# Patient Record
Sex: Female | Born: 1985 | Race: Black or African American | Hispanic: No | Marital: Single | Smoking: Current every day smoker
Health system: Southern US, Community
[De-identification: ages and names within clinical notes are randomized; demographics above are authoritative.]

## PROBLEM LIST (undated history)

## (undated) DIAGNOSIS — I10 Essential (primary) hypertension: Secondary | ICD-10-CM

## (undated) DIAGNOSIS — K219 Gastro-esophageal reflux disease without esophagitis: Secondary | ICD-10-CM

## (undated) DIAGNOSIS — I5189 Other ill-defined heart diseases: Secondary | ICD-10-CM

## (undated) DIAGNOSIS — I480 Paroxysmal atrial fibrillation: Secondary | ICD-10-CM

## (undated) DIAGNOSIS — F419 Anxiety disorder, unspecified: Secondary | ICD-10-CM

## (undated) DIAGNOSIS — Z9889 Other specified postprocedural states: Secondary | ICD-10-CM

## (undated) HISTORY — DX: Paroxysmal atrial fibrillation: I48.0

## (undated) HISTORY — DX: Other specified postprocedural states: Z98.890

## (undated) HISTORY — DX: Anxiety disorder, unspecified: F41.9

## (undated) HISTORY — PX: FOOT SURGERY: SHX648

## (undated) HISTORY — DX: Other ill-defined heart diseases: I51.89

---

## 2009-11-15 ENCOUNTER — Emergency Department: Payer: Self-pay | Admitting: Emergency Medicine

## 2010-07-14 ENCOUNTER — Emergency Department: Payer: Self-pay | Admitting: Emergency Medicine

## 2010-10-22 ENCOUNTER — Emergency Department: Payer: Self-pay | Admitting: Emergency Medicine

## 2010-11-05 ENCOUNTER — Emergency Department: Payer: Self-pay | Admitting: Emergency Medicine

## 2010-11-28 ENCOUNTER — Emergency Department: Payer: Self-pay | Admitting: Emergency Medicine

## 2011-02-20 ENCOUNTER — Observation Stay: Payer: Self-pay | Admitting: Obstetrics & Gynecology

## 2011-05-11 ENCOUNTER — Inpatient Hospital Stay: Payer: Self-pay

## 2011-05-11 LAB — CBC WITH DIFFERENTIAL/PLATELET
Basophil #: 0 10*3/uL (ref 0.0–0.1)
Eosinophil #: 0.2 10*3/uL (ref 0.0–0.7)
HCT: 35.5 % (ref 35.0–47.0)
Lymphocyte #: 2.4 10*3/uL (ref 1.0–3.6)
Lymphocyte %: 17.9 %
MCHC: 33 g/dL (ref 32.0–36.0)
MCV: 92 fL (ref 80–100)
Monocyte %: 10 %
Neutrophil %: 70.2 %
RBC: 3.85 10*6/uL (ref 3.80–5.20)
RDW: 14.1 % (ref 11.5–14.5)
WBC: 13.5 10*3/uL — ABNORMAL HIGH (ref 3.6–11.0)

## 2013-08-19 ENCOUNTER — Emergency Department: Payer: Self-pay | Admitting: Emergency Medicine

## 2013-11-06 ENCOUNTER — Emergency Department: Payer: Self-pay | Admitting: Student

## 2014-06-28 ENCOUNTER — Emergency Department
Admit: 2014-06-28 | Disposition: A | Discharge status: Home / Self Care | Payer: Self-pay | Admitting: Emergency Medicine

## 2014-07-01 ENCOUNTER — Emergency Department
Admit: 2014-07-01 | Disposition: A | Discharge status: Home / Self Care | Payer: Self-pay | Admitting: Emergency Medicine

## 2014-07-01 NOTE — Op Note (Signed)
PATIENT NAME:  Carol Small, Carol Small MR#:  [id number] DATE OF BIRTH:  [birth year]  DATE OF PROCEDURE:  05/11/2011  POSTOPERATIVE DIAGNOSIS: Term intrauterine pregnancy, fetal intolerance to labor.   POSTOPERATIVE DIAGNOSIS: Term intrauterine pregnancy, fetal intolerance to labor.   PROCEDURE PERFORMED: Low transverse cesarean section.   SURGEON: Glean Salen, M.D.   ASSISTANT: Midwife Putnam  ANESTHESIA: Spinal.   ESTIMATED BLOOD LOSS: 250 mL.   COMPLICATIONS: None.   FINDINGS: Normal tubes, ovaries, and uterus. Viable female infant named [place] weighing 6 pounds, 14 ounces with Apgar scores of 9 and 9 at one and five minutes, respectively.   DISPOSITION: To the recovery room in stable condition.   TECHNIQUE: The patient is prepped and draped in the usual sterile fashion after adequate anesthesia is obtained in the supine position on the operating room table.  A scalpel is used to create a low transverse skin incision that is quickly dissected through the rectus fascia with the rectus muscle separated in the midline. The peritoneum is penetrated and the bladder dissected off the lower uterine segment. Scalpel is then used to create a low transverse hysterotomy incision that is extended by blunt dissection. The amniotomy reveals mostly clear fluid, although there may be meconium stain. There was an amnioinfusion in progress prior to delivery. The infant's head is grasped and easily delivered without the use of a vacuum device. The remaining portion of the infant is delivered with clamping and cutting of the umbilical cord. The baby responded with a vigorous cry before the umbilical cord was even clamped. The infant is handed to the pediatric team.   Cord blood is obtained and the placenta is manually extracted. The uterus is externalized and cleansed of all membranes and debris using a moist sponge. The hysterotomy incision is closed with a running #1 Vicryl suture in a locking fashion followed  by a second layer to imbricate the first layer with excellent hemostasis noted. The uterus is placed back in the intraabdominal cavity and the paracolic gutters are irrigated with warm saline. Reexamination of the incision reveals excellent hemostasis. Interceed is placed over the incision. The peritoneum is closed with a Vicryl suture. The On-Q pain pump set up and is administered. Two trocars are placed into the abdomen into the subfascial space and then the catheters are threaded into this space. The rectus fascia is closed with 0 Maxon suture followed by subcutaneous irrigation and hemostasis is assured using electrocautery, subcuticular closed with a 4-0 Vicryl suture and then Dermabond is then applied. The catheters for the On-Q pain pump are hooked up and flushed with 5 mL each of 0.5% bupivacaine and then they are stabilized in place with Steri-Strips and a Tegaderm bandage. The patient goes to the recovery room in stable condition. All sponge, instrument, and needle counts are correct.  ____________________________ R. Barnett Applebaum, MD rph:slb D: 05/11/2011 15:20:27 ET T: 05/11/2011 16:11:23 ET JOB#: [id number]  cc: Glean Salen, MD, <Dictator> Gae Dry MD ELECTRONICALLY SIGNED 05/14/2011 7:06

## 2014-07-17 NOTE — H&P (Signed)
L&D Evaluation:  History:   HPI 29 year old G2P0010 at 39 weeks 5 days presented to L&D early this AM with SROM. Was 1 cm on arrival and grossly ruptured. Rec'd epidural recently and had prolonged decel shortly after that recovered after position changes, Terbutiline, fluid bolus, and o2 (see Dr Kenton Kingfisher recent progress note).   PNC at [facility] notable for early entry to care, GERD, and freqent headaches (using Fioricet).    Presents with leaking fluid, light meconium    Patient's Medical History No Chronic Illness  SAB 07/2010    Patient's Surgical History other  foot surgery    Medications Pre Natal Vitamins  fioricet, Zantac    Allergies NKDA    Social History tobacco    Family History Non-Contributory   ROS:   General normal    HEENT normal    GU normal    Resp normal   Exam:   Vital Signs stable    General no apparent distress    Mental Status clear    Chest clear    Abdomen gravid, non-tender    Estimated Fetal Weight Average for gestational age    Back no CVAT    Edema no edema    Pelvic no external lesions, 5/100/-1    Mebranes Ruptured    Description green/meconium    FHT just recovered from prolong decel, had some variables and early decels prior to prolong    Ucx regular, tachysystole-resolved with terbutiline    Skin dry   Impression:   Impression active labor, 39w 5d IUP   Plan:   Plan monitor contractions and for cervical change, other, FSE placed, will place IUPC, continue to closely observe FHR    Comments Dr Kenton Kingfisher called to L&D during prolong decel, pt counseled on possible need for c/s if FHR continues to decel. FHR recovered at this time after interventions. Will continue to closely observe.   Electronic Signatures: Shann Medal (CNM)  (Signed 04-Mar-13 09:17)  Authored: L&D Evaluation   Last Updated: 04-Mar-13 09:17 by Shann Medal (CNM)

## 2015-06-03 ENCOUNTER — Encounter: Payer: Self-pay | Admitting: *Deleted

## 2015-06-10 ENCOUNTER — Ambulatory Visit: Payer: Self-pay

## 2015-06-10 ENCOUNTER — Encounter: Payer: Self-pay | Admitting: General Surgery

## 2015-06-10 ENCOUNTER — Ambulatory Visit (INDEPENDENT_AMBULATORY_CARE_PROVIDER_SITE_OTHER): Payer: Medicaid Other | Admitting: General Surgery

## 2015-06-10 VITALS — BP 126/74 | HR 82 | Resp 14 | Ht 64.0 in | Wt 190.0 lb

## 2015-06-10 DIAGNOSIS — N6320 Unspecified lump in the left breast, unspecified quadrant: Secondary | ICD-10-CM

## 2015-06-10 DIAGNOSIS — N63 Unspecified lump in breast: Secondary | ICD-10-CM | POA: Diagnosis not present

## 2015-06-10 NOTE — Progress Notes (Signed)
Patient ID: Carol Small, female   DOB: Nov 16, 1985, 30 y.o.   MRN: [id number]  Chief Complaint  Patient presents with  . OTHER    Left breast mass    HPI Carol Small is a 30 y.o. female.  Here today for evaluation of a left breast mass.  Noticed twelve years ago, no change in size or shape.  Locacated in lower inner quad.  Reports some tenderness from time to time. At one time she did seek evaluation but had no further w/u8 done. I have reviewed the history of present illness with the patient. HPI  Past Medical History  Diagnosis Date  . S/P LEEP of cervix     Past Surgical History  Procedure Laterality Date  . Foot surgery      Family History  Problem Relation Age of Onset  . Thyroid cancer Maternal Grandmother     Social History Social History  Substance Use Topics  . Smoking status: Light Tobacco Smoker  . Smokeless tobacco: Never Used  . Alcohol Use: 0.0 oz/week    0 Standard drinks or equivalent per week    No Known Allergies  No current outpatient prescriptions on file.   No current facility-administered medications for this visit.    Review of Systems Review of Systems  Blood pressure 126/74, pulse 82, resp. rate 14, height 5\' 4"  (1.626 m), weight 190 lb (86.183 kg), last menstrual period 07/14/2010.  Physical Exam Physical Exam  Constitutional: She is oriented to person, place, and time. She appears well-developed and well-nourished.  Eyes: Conjunctivae and EOM are normal. No scleral icterus.  Neck: Neck supple.  Cardiovascular: Normal rate, regular rhythm and normal heart sounds.   Pulmonary/Chest: Effort normal and breath sounds normal. Right breast exhibits no inverted nipple, no mass, no nipple discharge, no skin change and no tenderness. Left breast exhibits mass. Left breast exhibits no inverted nipple, no nipple discharge, no skin change and no tenderness.  1.5 cm hard mass left mammary fold at 7:30 .  Abdominal: Bowel  sounds are normal.  Lymphadenopathy:    She has no cervical adenopathy.    She has no axillary adenopathy.  Neurological: She is alert and oriented to person, place, and time.  Skin: Skin is warm and dry.    Data Reviewed Notes reviewed  Targeted US was performed over left breast mass- a spiculated  Mass with some calcifications seen, some shadowing.  Assessment    Suspicious mass left breast. Discussed fully with pt. Mammogram followed by core biopsy recommended. Pt is agreeable.    Plan    Patient to have a bilateral diagnostic mammogram at [facility] on 06-14-15 at 12 noon. An appointment will be arranged after for left breast core biopsy (office).     Scribed by Garry Heater G 06/12/2015, 3:17 PM

## 2015-06-10 NOTE — Patient Instructions (Addendum)
Patient to return after mammogram.

## 2015-06-12 ENCOUNTER — Encounter: Payer: Self-pay | Admitting: General Surgery

## 2015-06-13 ENCOUNTER — Ambulatory Visit: Payer: Self-pay

## 2015-06-19 ENCOUNTER — Other Ambulatory Visit: Payer: Self-pay

## 2015-06-19 ENCOUNTER — Encounter: Payer: Self-pay | Admitting: General Surgery

## 2015-06-19 ENCOUNTER — Ambulatory Visit (INDEPENDENT_AMBULATORY_CARE_PROVIDER_SITE_OTHER): Payer: Medicaid Other | Admitting: General Surgery

## 2015-06-19 VITALS — Ht 64.0 in | Wt 189.0 lb

## 2015-06-19 DIAGNOSIS — N6320 Unspecified lump in the left breast, unspecified quadrant: Secondary | ICD-10-CM

## 2015-06-19 DIAGNOSIS — N63 Unspecified lump in breast: Secondary | ICD-10-CM

## 2015-06-19 NOTE — Progress Notes (Signed)
Patient ID: Carol Small, female   DOB: 1985/08/25, 30 y.o.   MRN: [id number] The patient is here for a scheduled left breast biopsy and possible left axillary lymph node biopsy.  I have reviewed the history of present illness with the patient.   Mammogram and left axillary [place] were reviewed. The mass in question left breast is suspicious. One axillar node with focal cortical thickening was noted.  Core biopsy of left breast mass and left axillary node were completed today. Further management based on pathology.       This has been scribed by Lesly Rubenstein LPN PCP: Lamonte Sakai

## 2015-06-19 NOTE — Patient Instructions (Signed)
Breast Biopsy, Care After  Refer to this sheet in the next few weeks. These instructions provide you with information on caring for yourself after your procedure. Your caregiver may also give you more specific instructions. Your treatment has been planned according to current medical practices, but problems sometimes occur. Call your caregiver if you have any problems or questions after your procedure.  HOME CARE INSTRUCTIONS    Only take over-the-counter or prescription medicines for pain, discomfort, or fever as directed by your caregiver.   Do not take aspirin. It can cause bleeding.   Keep stitches dry when bathing.   Protect the biopsy area. Do not let the area get bumped.   Avoid activities that may pull the incision site open until approved by your caregiver. This can include stretching, reaching, exercise, sports, or lifting over 3 pounds.   Resume your usual diet.   Wear a good support bra for as long as directed by your caregiver.   Change any bandages (dressings) as directed by your caregiver.   Do not drink alcohol while taking pain medicine.   Keep all your follow-up appointments with your caregiver. Ask when your test results will be ready. Make sure you get your test results.  SEEK MEDICAL CARE IF:    You have redness, swelling, or increasing pain in the biopsy site.   You have a bad smell coming from the biopsy site or dressing.   Your biopsy site breaks open after the stitches (sutures), staples, or skin adhesive strips have been removed.   You have a rash.   You need stronger medicine.  SEEK IMMEDIATE MEDICAL CARE IF:    You have a fever.   You have increased bleeding (more than a small spot) from the biopsy site.   You have difficulty breathing.   You have pus coming from the biopsy site.  MAKE SURE YOU:   Understand these instructions.   Will watch your condition.   Will get help right away if you are not doing well or get worse.     This information is not intended to  replace advice given to you by your health care provider. Make sure you discuss any questions you have with your health care provider.     Document Released: 09/12/2004 Document Revised: 03/16/2014 Document Reviewed: 03/26/2011  Elsevier Interactive Patient Education 2016 Elsevier Inc.

## 2015-06-20 ENCOUNTER — Telehealth: Payer: Self-pay | Admitting: *Deleted

## 2015-06-20 ENCOUNTER — Encounter: Payer: Self-pay | Admitting: General Surgery

## 2015-06-20 NOTE — Telephone Encounter (Signed)
-----   Message from Christene Lye, MD sent at 06/20/2015  3:00 PM EDT ----- Pt was advised over telephone-this is a benign neoplasm -but it does warrant local excision. She was advised on this procedure to be done in SDS with anesthesia. Pt is agreeable and wished to go ahead with schedling.

## 2015-06-20 NOTE — Telephone Encounter (Signed)
Patient's surgery has been scheduled for 07-01-15 at [facility].

## 2015-06-25 ENCOUNTER — Other Ambulatory Visit: Payer: Self-pay | Admitting: General Surgery

## 2015-06-25 DIAGNOSIS — D242 Benign neoplasm of left breast: Secondary | ICD-10-CM

## 2015-06-26 ENCOUNTER — Other Ambulatory Visit: Payer: Medicaid Other

## 2015-06-26 ENCOUNTER — Encounter: Payer: Self-pay | Admitting: *Deleted

## 2015-06-26 NOTE — Patient Instructions (Signed)
  Your procedure is scheduled on: 07-01-15 (MONDAY) Report to [facility] To find out your arrival time please call [phone] between 1PM - 3PM on 06-28-15 (FRIDAY)  Remember: Instructions that are not followed completely may result in serious medical risk, up to and including death, or upon the discretion of your surgeon and anesthesiologist your surgery may need to be rescheduled.    _X___ 1. Do not eat food or drink liquids after midnight. No gum chewing or hard candies.     _X___ 2. No Alcohol for 24 hours before or after surgery.   ____ 3. Bring all medications with you on the day of surgery if instructed.    _X___ 4. Notify your doctor if there is any change in your medical condition     (cold, fever, infections).     Do not wear jewelry, make-up, hairpins, clips or nail polish.  Do not wear lotions, powders, or perfumes. You may wear deodorant.  Do not shave 48 hours prior to surgery. Men may shave face and neck.  Do not bring valuables to the hospital.    [facility] is not responsible for any belongings or valuables.               Contacts, dentures or bridgework may not be worn into surgery.  Leave your suitcase in the car. After surgery it may be brought to your room.  For patients admitted to the hospital, discharge time is determined by your treatment team.   Patients discharged the day of surgery will not be allowed to drive home.   Please read over the following fact sheets that you were given:      ____ Take these medicines the morning of surgery with A SIP OF WATER:    1. NONE  2.   3.   4.  5.  6.  ____ Fleet Enema (as directed)   _X___ Use CHG Soap as directed  ____ Use inhalers on the day of surgery  ____ Stop metformin 2 days prior to surgery    ____ Take 1/2 of usual insulin dose the night before surgery and none on the morning of surgery.   _X___ Stop Coumadin/Plavix/aspirin-STOP BC POWDERS NOW  ____ Stop  Anti-inflammatories-NO NSAIDS OR ASPIRIN PRODUCTS-TYLENOL OK TO TAKE   ____ Stop supplements until after surgery.    ____ Bring C-Pap to the hospital.

## 2015-06-27 ENCOUNTER — Encounter
Admission: RE | Admit: 2015-06-27 | Discharge: 2015-06-27 | Disposition: A | Discharge status: Home / Self Care | Payer: Medicaid Other | Source: Ambulatory Visit | Attending: General Surgery | Admitting: General Surgery

## 2015-06-27 DIAGNOSIS — Z01810 Encounter for preprocedural cardiovascular examination: Secondary | ICD-10-CM | POA: Insufficient documentation

## 2015-07-01 ENCOUNTER — Ambulatory Visit: Payer: Medicaid Other | Admitting: Anesthesiology

## 2015-07-01 ENCOUNTER — Ambulatory Visit
Admission: RE | Admit: 2015-07-01 | Discharge: 2015-07-01 | Disposition: A | Discharge status: Home / Self Care | Payer: Medicaid Other | Source: Ambulatory Visit | Attending: General Surgery | Admitting: General Surgery

## 2015-07-01 ENCOUNTER — Encounter: Payer: Self-pay | Admitting: *Deleted

## 2015-07-01 ENCOUNTER — Encounter
Admission: RE | Disposition: A | Discharge status: Home / Self Care | Payer: Self-pay | Source: Ambulatory Visit | Attending: General Surgery

## 2015-07-01 DIAGNOSIS — Z808 Family history of malignant neoplasm of other organs or systems: Secondary | ICD-10-CM | POA: Insufficient documentation

## 2015-07-01 DIAGNOSIS — D4862 Neoplasm of uncertain behavior of left breast: Secondary | ICD-10-CM | POA: Diagnosis not present

## 2015-07-01 DIAGNOSIS — I10 Essential (primary) hypertension: Secondary | ICD-10-CM | POA: Insufficient documentation

## 2015-07-01 DIAGNOSIS — E669 Obesity, unspecified: Secondary | ICD-10-CM | POA: Diagnosis not present

## 2015-07-01 DIAGNOSIS — F17200 Nicotine dependence, unspecified, uncomplicated: Secondary | ICD-10-CM | POA: Insufficient documentation

## 2015-07-01 DIAGNOSIS — Z6832 Body mass index (BMI) 32.0-32.9, adult: Secondary | ICD-10-CM | POA: Insufficient documentation

## 2015-07-01 DIAGNOSIS — D242 Benign neoplasm of left breast: Secondary | ICD-10-CM | POA: Diagnosis present

## 2015-07-01 HISTORY — DX: Essential (primary) hypertension: I10

## 2015-07-01 HISTORY — PX: BREAST LUMPECTOMY: SHX2

## 2015-07-01 HISTORY — DX: Gastro-esophageal reflux disease without esophagitis: K21.9

## 2015-07-01 LAB — POCT PREGNANCY, URINE: Preg Test, Ur: NEGATIVE

## 2015-07-01 SURGERY — BREAST LUMPECTOMY
Anesthesia: General | Laterality: Left | Wound class: Clean

## 2015-07-01 MED ORDER — BUPIVACAINE HCL (PF) 0.5 % IJ SOLN
INTRAMUSCULAR | Status: AC
  Filled 2015-07-01: qty 30

## 2015-07-01 MED ORDER — FAMOTIDINE 20 MG PO TABS
20.0000 mg | ORAL_TABLET | Freq: Once | ORAL | Status: AC
  Administered 2015-07-01: 20 mg via ORAL

## 2015-07-01 MED ORDER — DEXAMETHASONE SODIUM PHOSPHATE 10 MG/ML IJ SOLN
INTRAMUSCULAR | Status: DC | PRN
  Administered 2015-07-01: 4 mg via INTRAVENOUS

## 2015-07-01 MED ORDER — LACTATED RINGERS IV SOLN
INTRAVENOUS | Status: DC
  Administered 2015-07-01: 07:00:00 via INTRAVENOUS

## 2015-07-01 MED ORDER — LIDOCAINE HCL (CARDIAC) 20 MG/ML IV SOLN
INTRAVENOUS | Status: DC | PRN
  Administered 2015-07-01: 80 mg via INTRAVENOUS

## 2015-07-01 MED ORDER — BUPIVACAINE HCL (PF) 0.5 % IJ SOLN
INTRAMUSCULAR | Status: DC | PRN
  Administered 2015-07-01: 20 mL

## 2015-07-01 MED ORDER — MIDAZOLAM HCL 2 MG/2ML IJ SOLN
INTRAMUSCULAR | Status: DC | PRN
  Administered 2015-07-01: 2 mg via INTRAVENOUS

## 2015-07-01 MED ORDER — PROPOFOL 10 MG/ML IV BOLUS
INTRAVENOUS | Status: DC | PRN
  Administered 2015-07-01: 160 mg via INTRAVENOUS

## 2015-07-01 MED ORDER — ONDANSETRON HCL 4 MG/2ML IJ SOLN
INTRAMUSCULAR | Status: DC | PRN
  Administered 2015-07-01: 4 mg via INTRAVENOUS

## 2015-07-01 MED ORDER — ONDANSETRON HCL 4 MG/2ML IJ SOLN: 4.0000 mg | Freq: Once | INTRAMUSCULAR | Status: DC | PRN

## 2015-07-01 MED ORDER — FENTANYL CITRATE (PF) 100 MCG/2ML IJ SOLN: 25.0000 ug | INTRAMUSCULAR | Status: DC | PRN

## 2015-07-01 MED ORDER — CHLORHEXIDINE GLUCONATE 4 % EX LIQD: 1.0000 "application " | Freq: Once | CUTANEOUS | Status: DC

## 2015-07-01 MED ORDER — TRAMADOL HCL 50 MG PO TABS: 50.0000 mg | ORAL_TABLET | Freq: Four times a day (QID) | ORAL | Status: DC | PRN

## 2015-07-01 MED ORDER — FAMOTIDINE 20 MG PO TABS
ORAL_TABLET | ORAL | Status: AC
  Administered 2015-07-01: 20 mg via ORAL
  Filled 2015-07-01: qty 1

## 2015-07-01 MED ORDER — FENTANYL CITRATE (PF) 100 MCG/2ML IJ SOLN
INTRAMUSCULAR | Status: DC | PRN
  Administered 2015-07-01 (×3): 50 ug via INTRAVENOUS

## 2015-07-01 MED ORDER — KETOROLAC TROMETHAMINE 30 MG/ML IJ SOLN
INTRAMUSCULAR | Status: DC | PRN
  Administered 2015-07-01: 30 mg via INTRAVENOUS

## 2015-07-01 MED ORDER — GLYCOPYRROLATE 0.2 MG/ML IJ SOLN
INTRAMUSCULAR | Status: DC | PRN
  Administered 2015-07-01: .2 mg via INTRAVENOUS

## 2015-07-01 SURGICAL SUPPLY — 30 items
BLADE SURG 15 STRL SS SAFETY (BLADE) ×3 IMPLANT
BULB RESERV EVAC DRAIN JP 100C (MISCELLANEOUS) IMPLANT
CANISTER SUCT 1200ML W/VALVE (MISCELLANEOUS) ×3 IMPLANT
CHLORAPREP W/TINT 26ML (MISCELLANEOUS) ×3 IMPLANT
CNTNR SPEC 2.5X3XGRAD LEK (MISCELLANEOUS) ×1
CONT SPEC 4OZ STER OR WHT (MISCELLANEOUS) ×2
CONTAINER SPEC 2.5X3XGRAD LEK (MISCELLANEOUS) ×1 IMPLANT
COVER PROBE FLX POLY STRL (MISCELLANEOUS) IMPLANT
DEVICE LOCALIZATION ULTRAWIRE (WIRE) IMPLANT
DRAIN CHANNEL JP 15F RND 16 (MISCELLANEOUS) IMPLANT
DRAPE LAPAROTOMY TRNSV 106X77 (MISCELLANEOUS) ×3 IMPLANT
ELECT REM PT RETURN 9FT ADLT (ELECTROSURGICAL) ×3
ELECTRODE REM PT RTRN 9FT ADLT (ELECTROSURGICAL) ×1 IMPLANT
GLOVE BIO SURGEON STRL SZ7 (GLOVE) ×15 IMPLANT
GOWN STRL REUS W/ TWL LRG LVL3 (GOWN DISPOSABLE) ×3 IMPLANT
GOWN STRL REUS W/TWL LRG LVL3 (GOWN DISPOSABLE) ×6
HARMONIC SCALPEL FOCUS (MISCELLANEOUS) IMPLANT
KIT RM TURNOVER STRD PROC AR (KITS) ×3 IMPLANT
LABEL OR SOLS (LABEL) ×3 IMPLANT
LIQUID BAND (GAUZE/BANDAGES/DRESSINGS) ×3 IMPLANT
MARGIN MAP 10MM (MISCELLANEOUS) ×6 IMPLANT
NEEDLE HYPO 25X1 1.5 SAFETY (NEEDLE) ×3 IMPLANT
PACK BASIN MINOR ARMC (MISCELLANEOUS) ×3 IMPLANT
SUT ETH BLK MONO 3 0 FS 1 12/B (SUTURE) ×3 IMPLANT
SUT MNCRL AB 3-0 PS2 27 (SUTURE) ×3 IMPLANT
SUT VIC AB 2-0 BRD 54 (SUTURE) ×3 IMPLANT
SUT VIC AB 2-0 CT2 27 (SUTURE) ×6 IMPLANT
SYR CONTROL 10ML (SYRINGE) ×3 IMPLANT
ULTRAWIRE LOCALIZATION DEVICE (WIRE)
WATER STERILE IRR 1000ML POUR (IV SOLUTION) ×3 IMPLANT

## 2015-07-01 NOTE — H&P (View-Only) (Signed)
Patient ID: Carol Small, female   DOB: 1985/12/20, 30 y.o.   MRN: [id number]  Chief Complaint  Patient presents with  . OTHER    Left breast mass    HPI Carol Small is a 30 y.o. female.  Here today for evaluation of a left breast mass.  Noticed twelve years ago, no change in size or shape.  Locacated in lower inner quad.  Reports some tenderness from time to time. At one time she did seek evaluation but had no further w/u8 done. I have reviewed the history of present illness with the patient. HPI  Past Medical History  Diagnosis Date  . S/P LEEP of cervix     Past Surgical History  Procedure Laterality Date  . Foot surgery      Family History  Problem Relation Age of Onset  . Thyroid cancer Maternal Grandmother     Social History Social History  Substance Use Topics  . Smoking status: Light Tobacco Smoker  . Smokeless tobacco: Never Used  . Alcohol Use: 0.0 oz/week    0 Standard drinks or equivalent per week    No Known Allergies  No current outpatient prescriptions on file.   No current facility-administered medications for this visit.    Review of Systems Review of Systems  Blood pressure 126/74, pulse 82, resp. rate 14, height 5\' 4"  (1.626 m), weight 190 lb (86.183 kg), last menstrual period 07/14/2010.  Physical Exam Physical Exam  Constitutional: She is oriented to person, place, and time. She appears well-developed and well-nourished.  Eyes: Conjunctivae and EOM are normal. No scleral icterus.  Neck: Neck supple.  Cardiovascular: Normal rate, regular rhythm and normal heart sounds.   Pulmonary/Chest: Effort normal and breath sounds normal. Right breast exhibits no inverted nipple, no mass, no nipple discharge, no skin change and no tenderness. Left breast exhibits mass. Left breast exhibits no inverted nipple, no nipple discharge, no skin change and no tenderness.  1.5 cm hard mass left mammary fold at 7:30 .  Abdominal: Bowel  sounds are normal.  Lymphadenopathy:    She has no cervical adenopathy.    She has no axillary adenopathy.  Neurological: She is alert and oriented to person, place, and time.  Skin: Skin is warm and dry.    Data Reviewed Notes reviewed  Targeted US was performed over left breast mass- a spiculated  Mass with some calcifications seen, some shadowing.  Assessment    Suspicious mass left breast. Discussed fully with pt. Mammogram followed by core biopsy recommended. Pt is agreeable.    Plan    Patient to have a bilateral diagnostic mammogram at [facility] on 06-14-15 at 12 noon. An appointment will be arranged after for left breast core biopsy (office).     Scribed by Garry Heater G 06/12/2015, 3:17 PM

## 2015-07-01 NOTE — Anesthesia Preprocedure Evaluation (Addendum)
Anesthesia Evaluation  Patient identified by MRN, date of birth, ID band Patient awake    Reviewed: Allergy & Precautions, NPO status , Patient's Chart, lab work & pertinent test results, reviewed documented beta blocker date and time   Airway Mallampati: II  TM Distance: >3 FB     Dental  (+) Chipped   Pulmonary Current Smoker,           Cardiovascular hypertension,      Neuro/Psych    GI/Hepatic   Endo/Other    Renal/GU      Musculoskeletal   Abdominal   Peds  Hematology   Anesthesia Other Findings Obese. Does not take BP meds. EKG checked and OK.  Reproductive/Obstetrics                            Anesthesia Physical Anesthesia Plan  ASA: II  Anesthesia Plan: General   Post-op Pain Management:    Induction: Intravenous  Airway Management Planned: LMA  Additional Equipment:   Intra-op Plan:   Post-operative Plan:   Informed Consent: I have reviewed the patients History and Physical, chart, labs and discussed the procedure including the risks, benefits and alternatives for the proposed anesthesia with the patient or authorized representative who has indicated his/her understanding and acceptance.     Plan Discussed with: CRNA  Anesthesia Plan Comments:         Anesthesia Quick Evaluation

## 2015-07-01 NOTE — Discharge Instructions (Signed)
AMBULATORY SURGERY  DISCHARGE INSTRUCTIONS   1) The drugs that you were given will stay in your system until tomorrow so for the next 24 hours you should not:  A) Drive an automobile B) Make any legal decisions C) Drink any alcoholic beverage   2) You may resume regular meals tomorrow.  Today it is better to start with liquids and gradually work up to solid foods.  You may eat anything you prefer, but it is better to start with liquids, then soup and crackers, and gradually work up to solid foods.   3) Please notify your doctor immediately if you have any unusual bleeding, trouble breathing, redness and pain at the surgery site, drainage, fever, or pain not relieved by medication.    4) Additional Instructions:        Please contact your physician with any problems or Same Day Surgery at [phone], Monday through Friday 6 am to 4 pm, or [place] at [facility] number at [phone]. Breast Biopsy, Care After  Refer to this sheet in the next few weeks. These instructions provide you with information on caring for yourself after your procedure. Your caregiver may also give you more specific instructions. Your treatment has been planned according to current medical practices, but problems sometimes occur. Call your caregiver if you have any problems or questions after your procedure. HOME CARE INSTRUCTIONS   Only take over-the-counter or prescription medicines for pain, discomfort, or fever as directed by your caregiver.  Do not take aspirin. It can cause bleeding.  Keep stitches dry when bathing.  Protect the biopsy area. Do not let the area get bumped.  Avoid activities that may pull the incision site open until approved by your caregiver. This can include stretching, reaching, exercise, sports, or lifting over 3 pounds.  Resume your usual diet.  Wear a good support bra for as long as directed by your caregiver.  Change any bandages (dressings) as directed by  your caregiver.  Do not drink alcohol while taking pain medicine.  Keep all your follow-up appointments with your caregiver. Ask when your test results will be ready. Make sure you get your test results. SEEK MEDICAL CARE IF:   You have redness, swelling, or increasing pain in the biopsy site.  You have a bad smell coming from the biopsy site or dressing.  Your biopsy site breaks open after the stitches (sutures), staples, or skin adhesive strips have been removed.  You have a rash.  You need stronger medicine. SEEK IMMEDIATE MEDICAL CARE IF:   You have a fever.  You have increased bleeding (more than a small spot) from the biopsy site.  You have difficulty breathing.  You have pus coming from the biopsy site. MAKE SURE YOU:  Understand these instructions.  Will watch your condition.  Will get help right away if you are not doing well or get worse.   This information is not intended to replace advice given to you by your health care provider. Make sure you discuss any questions you have with your health care provider.   Document Released: 09/12/2004 Document Revised: 03/16/2014 Document Reviewed: 03/26/2011 Elsevier Interactive Patient Education [employer].

## 2015-07-01 NOTE — Anesthesia Procedure Notes (Signed)
Procedure Name: LMA Insertion Performed by: Lance Muss Pre-anesthesia Checklist: Patient identified, Emergency Drugs available, Suction available, Patient being monitored and Timeout performed Patient Re-evaluated:Patient Re-evaluated prior to inductionOxygen Delivery Method: Circle system utilized Preoxygenation: Pre-oxygenation with 100% oxygen Intubation Type: IV induction LMA: LMA inserted LMA Size: 3.0 Number of attempts: 1 Tube secured with: Tape Dental Injury: Teeth and Oropharynx as per pre-operative assessment

## 2015-07-01 NOTE — Anesthesia Postprocedure Evaluation (Signed)
Anesthesia Post Note  Patient: Carol Small  Procedure(s) Performed: Procedure(s) (LRB): BREAST LUMPECTOMY (Left)  Patient location during evaluation: PACU Anesthesia Type: General Level of consciousness: awake and alert Pain management: pain level controlled Vital Signs Assessment: post-procedure vital signs reviewed and stable Respiratory status: spontaneous breathing, nonlabored ventilation, respiratory function stable and patient connected to nasal cannula oxygen Cardiovascular status: blood pressure returned to baseline and stable Postop Assessment: no signs of nausea or vomiting Anesthetic complications: no    Last Vitals:  Filed Vitals:   07/01/15 0915 07/01/15 0945  BP: 128/83 133/82  Pulse: 91 88  Temp:    Resp: 18 18    Last Pain: There were no vitals filed for this visit.               Dixie Jafri S

## 2015-07-01 NOTE — Op Note (Signed)
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 LMA  Complications: None  EBL: Minimal  Drains: None  Description: Patient was put to sleep with an LMA and the left breast was prepped and draped as sterile field. Timeout was performed. The mass in question which was previously core biopsied was located at the 7:30 o'clock and centimeters from the nipple. Ultrasound probe was brought to the field and the mass in question was identified and the area marked for an adequate excision. Since the skin was somewhat the pulled down by this mass with decided to take a portion of the skin with the mass. An elliptical type incision and occurred with fashion along the skin crease on was made overlying the mass. The upper lower edges of the skin and subcutaneous tissue were elevated with cautery. The mass in question along with the overlying skin was then excised out with an adequate normal-appearing margin using cautery for control of bleeding. The excised specimen was tagged for margins and sent to pathology. The gland laterally was reapproximated with interrupted interrupted 2-0 Vicryl as also the subcutaneous tissue. Skin was approximated with a subcuticular 3-0 Monocryl stitch. Liqui  ban was applied. Patient subsequently returned recovery room in stable condition

## 2015-07-01 NOTE — Interval H&P Note (Signed)
History and Physical Interval Note:  07/01/2015 7:18 AM  Carol Small  has presented today for surgery, with the diagnosis of BENIGN NEOPLASM LEFT BREAST  The various methods of treatment have been discussed with the patient and family. After consideration of risks, benefits and other options for treatment, the patient has consented to  Procedure(s): BREAST LUMPECTOMY (Left) as a surgical intervention .  The patient's history has been reviewed, patient examined, no change in status, stable for surgery.  I have reviewed the patient's chart and labs.  Questions were answered to the patient's satisfaction.     Veronda [name]

## 2015-07-01 NOTE — Transfer of Care (Signed)
Immediate Anesthesia Transfer of Care Note  Patient: Carol Small  Procedure(s) Performed: Procedure(s): BREAST LUMPECTOMY (Left)  Patient Location: PACU  Anesthesia Type:General  Level of Consciousness: awake, alert  and responds to stimulation  Airway & Oxygen Therapy: Patient Spontanous Breathing and Patient connected to face mask oxygen  Post-op Assessment: Report given to RN and Post -op Vital signs reviewed and stable  Post vital signs: Reviewed and stable  Last Vitals:  Filed Vitals:   07/01/15 0819 07/01/15 0821  BP:  132/90  Pulse:  98  Temp: 35.9 C   Resp: 20 24    Complications: No apparent anesthesia complications

## 2015-07-02 LAB — SURGICAL PATHOLOGY

## 2015-07-03 ENCOUNTER — Telehealth: Payer: Self-pay | Admitting: *Deleted

## 2015-07-03 NOTE — Telephone Encounter (Signed)
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

## 2015-07-03 NOTE — Telephone Encounter (Signed)
Notified patient as instructed, patient pleased. Discussed follow-up appointments, patient agrees  

## 2015-07-04 ENCOUNTER — Encounter: Payer: Self-pay | Admitting: General Surgery

## 2015-07-10 ENCOUNTER — Ambulatory Visit: Payer: Medicaid Other | Admitting: General Surgery

## 2015-08-14 ENCOUNTER — Encounter: Payer: Self-pay | Admitting: *Deleted

## 2015-09-02 ENCOUNTER — Encounter: Payer: Self-pay | Admitting: Emergency Medicine

## 2015-09-02 ENCOUNTER — Emergency Department: Payer: Medicaid Other

## 2015-09-02 ENCOUNTER — Emergency Department
Admission: EM | Admit: 2015-09-02 | Discharge: 2015-09-02 | Disposition: A | Discharge status: Home / Self Care | Payer: Medicaid Other | Attending: Emergency Medicine | Admitting: Emergency Medicine

## 2015-09-02 DIAGNOSIS — J029 Acute pharyngitis, unspecified: Secondary | ICD-10-CM | POA: Diagnosis present

## 2015-09-02 DIAGNOSIS — J0110 Acute frontal sinusitis, unspecified: Secondary | ICD-10-CM

## 2015-09-02 DIAGNOSIS — I10 Essential (primary) hypertension: Secondary | ICD-10-CM | POA: Insufficient documentation

## 2015-09-02 DIAGNOSIS — F17210 Nicotine dependence, cigarettes, uncomplicated: Secondary | ICD-10-CM | POA: Diagnosis not present

## 2015-09-02 DIAGNOSIS — Z7982 Long term (current) use of aspirin: Secondary | ICD-10-CM | POA: Insufficient documentation

## 2015-09-02 LAB — POCT RAPID STREP A: Streptococcus, Group A Screen (Direct): NEGATIVE

## 2015-09-02 MED ORDER — ALBUTEROL SULFATE HFA 108 (90 BASE) MCG/ACT IN AERS: 2.0000 | INHALATION_SPRAY | Freq: Four times a day (QID) | RESPIRATORY_TRACT | Status: DC | PRN

## 2015-09-02 MED ORDER — FLUTICASONE PROPIONATE 50 MCG/ACT NA SUSP: 2.0000 | Freq: Every day | NASAL | Status: DC

## 2015-09-02 MED ORDER — BENZONATATE 100 MG PO CAPS: 100.0000 mg | ORAL_CAPSULE | Freq: Three times a day (TID) | ORAL | Status: DC | PRN

## 2015-09-02 MED ORDER — AMOXICILLIN 500 MG PO CAPS: 500.0000 mg | ORAL_CAPSULE | Freq: Three times a day (TID) | ORAL | Status: DC

## 2015-09-02 NOTE — ED Provider Notes (Signed)
[facility] Emergency Department Provider Note ____________________________________________  Time seen: 1405  I have reviewed the triage vital signs and the nursing notes.  HISTORY  Chief Complaint  Sore Throat  HPI Carol Small is a 30 y.o. female presents to the ED for evaluation of sore throat pain since yesterday. She describes onset at about 3 AM on Saturday morning after sleeping with the fan on overnight.She describes pressure and fullness to the left side of the head and left ear. Since that time she is developing intermittently productive cough, has noticed some pink mucus coming from the sinuses when she coughs. She denies any outright chest pain, shortness of breath, or wheezing. She has dosed over-the-counter multisymptom cough and cold medicine as well as Chloraseptic throat spray, nothing has given her a lasting benefit. She also notes bilateral sore throat. She denies any interim fevers, chills, or sweats. She denies any nasal congestion, runny nose, sneezing, or rhinorrhea. She reports her discomfort at an 8/10 in triage.  Past Medical History  Diagnosis Date  . S/P LEEP of cervix   . GERD (gastroesophageal reflux disease)   . Hypertension     NO MEDS-NEVER FOLLOWED BACK UP WITH PCP    There are no active problems to display for this patient.   Past Surgical History  Procedure Laterality Date  . Foot surgery    . Breast lumpectomy Left 07/01/2015    Procedure: BREAST LUMPECTOMY;  Surgeon: Christene Lye, MD;  Location: [facility] ORS;  Service: General;  Laterality: Left;    Current Outpatient Rx  Name  Route  Sig  Dispense  Refill  . albuterol (PROVENTIL HFA;VENTOLIN HFA) 108 (90 Base) MCG/ACT inhaler   Inhalation   Inhale 2 puffs into the lungs every 6 (six) hours as needed for wheezing or shortness of breath.   1 Inhaler   0   . amoxicillin (AMOXIL) 500 MG capsule   Oral   Take 1 capsule (500 mg total) by mouth 3 (three) times  daily.   30 capsule   0   . Aspirin-Salicylamide-Caffeine (BC HEADACHE PO)   Oral   Take 1 Dose by mouth as needed.         . benzonatate (TESSALON PERLES) 100 MG capsule   Oral   Take 1 capsule (100 mg total) by mouth 3 (three) times daily as needed for cough (Take 1-2 per dose).   30 capsule   0   . calcium carbonate (TUMS - DOSED IN MG ELEMENTAL CALCIUM) 500 MG chewable tablet   Oral   Chew 1 tablet by mouth as needed for indigestion or heartburn.         . etonogestrel (NEXPLANON) 68 MG IMPL implant   Subdermal   1 each by Subdermal route once.         . fluticasone (FLONASE) 50 MCG/ACT nasal spray   Each Nare   Place 2 sprays into both nostrils daily.   16 g   0   . traMADol (ULTRAM) 50 MG tablet   Oral   Take 1 tablet (50 mg total) by mouth every 6 (six) hours as needed.   15 tablet   0    Allergies Review of patient's allergies indicates no known allergies.  Family History  Problem Relation Age of Onset  . Thyroid cancer Maternal Grandmother    Social History Social History  Substance Use Topics  . Smoking status: Current Every Day Smoker -- 0.25 packs/day for 13 years  Types: Cigarettes  . Smokeless tobacco: Never Used  . Alcohol Use: 0.0 oz/week    0 Standard drinks or equivalent per week     Comment: occ   Review of Systems  Constitutional: Negative for fever. Eyes: Negative for visual changes. ENT: Positive for sore throat. Cardiovascular: Negative for chest pain. Respiratory: Negative for shortness of breath. Reports productive cough as above. Gastrointestinal: Negative for abdominal pain, vomiting and diarrhea. Neurological: Negative for headaches, focal weakness or numbness. ____________________________________________  PHYSICAL EXAM:  VITAL SIGNS: ED Triage Vitals  Enc Vitals Group     BP 09/02/15 1353 149/106 mmHg     Pulse Rate 09/02/15 1353 98     Resp 09/02/15 1353 20     Temp 09/02/15 1353 98.7 F (37.1 C)     Temp  Source 09/02/15 1353 Oral     SpO2 09/02/15 1353 96 %     Weight 09/02/15 1353 189 lb (85.73 kg)     Height 09/02/15 1353 5\' 3"  (1.6 m)     Head Cir --      Peak Flow --      Pain Score 09/02/15 1339 8     Pain Loc --      Pain Edu? --      Excl. in [place]? --    Constitutional: Alert and oriented. Well appearing and in no distress. Head: Normocephalic and atraumatic.      Eyes: Conjunctivae are normal. PERRL. Normal extraocular movements      Ears: Canals clear. TMs intact bilaterally.   Nose: No congestion/rhinorrhea.   Mouth/Throat: Mucous membranes are moist.   Neck: Supple. No thyromegaly. Cardiovascular: Normal rate, regular rhythm.  Respiratory: Normal respiratory effort. No wheezes/rales/rhonchi. Gastrointestinal: Soft and nontender. No distention. Musculoskeletal: Nontender with normal range of motion in all extremities.  Neurologic: No gross focal neurologic deficits are appreciated. Skin:  Skin is warm, dry and intact. No rash noted. ____________________________________________   LABS (pertinent positives/negatives) Labs Reviewed  CULTURE, GROUP A STREP [facility])  POCT RAPID STREP A  ____________________________________________  RADIOLOGY  CXR IMPRESSION: Bronchitic changes without infiltrate.  I, Madeliene Tejera, Dannielle Karvonen, personally viewed and evaluated these images (plain radiographs) as part of my medical decision making, as well as reviewing the written report by the radiologist. ____________________________________________  INITIAL IMPRESSION / [place] / ED COURSE  Patient with the an acute frontal sinusitis and x-ray indication of bronchitic changes. She will be discharged with a prescription for amoxicillin, Flonase, Tessalon Perles, and albuterol inhaler. She is encouraged to continue to follow up with her primary care provider and dose previously over-the-counter medications. She should return to the ED for any acute chest pain or shortness  breath. ____________________________________________  FINAL CLINICAL IMPRESSION(S) / ED DIAGNOSES  Final diagnoses:  Acute frontal sinusitis, recurrence not specified     Melvenia Needles, PA-C 09/02/15 1817  Lavonia Drafts, MD 09/03/15 [phone]

## 2015-09-02 NOTE — ED Notes (Addendum)
"  I sleep with a fan on and I woke up about 0300 in the am on Saturday and the whole left side of my head was hurting and stopped up - my ear was hurting and my throat on both sides - I don't really feel any better so I needed to be checked."

## 2015-09-02 NOTE — Discharge Instructions (Signed)
Sinus Rinse WHAT IS A SINUS RINSE? A sinus rinse is a home treatment. It rinses your sinuses with a mixture of salt and water (saline solution). Sinuses are air-filled spaces in your skull behind the bones of your face and forehead. They open into your nasal cavity. To do a sinus rinse, you will need:  Saline solution.  Neti pot or spray bottle. This releases the saline solution into your nose and through your sinuses. You can buy neti pots and spray bottles at:  Your local pharmacy.  A health food store.  Online. WHEN WOULD I DO A SINUS RINSE?  A sinus rinse can help to clear your nasal cavity. It can clear:   Mucus.  Dirt.  Dust.  Pollen. You may do a sinus rinse when you have:  A cold.  A virus.  Allergies.  A sinus infection.  A stuffy nose. If you are considering a sinus rinse:  Ask your child's doctor before doing a sinus rinse on your child.  Do not do a sinus rinse if you have had:  Ear or nasal surgery.  An ear infection.  Blocked ears. HOW DO I DO A SINUS RINSE?   Wash your hands.  Disinfect your device using the directions that came with the device.  Dry your device.  Use the solution that comes with your device or one that is sold separately in stores. Follow the mixing directions on the package.  Fill your device with the amount of saline solution as stated in the device instructions.  Stand over a sink and tilt your head sideways over the sink.  Place the spout of the device in your upper nostril (the one closer to the ceiling).  Gently pour or squeeze the saline solution into the nasal cavity. The liquid should drain to the lower nostril if you are not too congested.  Gently blow your nose. Blowing too hard may cause ear pain.  Repeat in the other nostril.  Clean and rinse your device with clean water.  Air-dry your device. ARE THERE RISKS OF A SINUS RINSE?  Sinus rinse is normally very safe and helpful. However, there are a few  risks, which include:   A burning feeling in the sinuses. This may happen if you do not make the saline solution as instructed. Make sure to follow all directions when making the saline solution.  Infection from unclean water. This is rare, but possible.  Nasal irritation.   This information is not intended to replace advice given to you by your health care provider. Make sure you discuss any questions you have with your health care provider.   Document Released: 09/20/2013 Document Reviewed: 09/20/2013 Elsevier Interactive Patient Education 2016 [employer].  Sinusitis, Adult Sinusitis is redness, soreness, and puffiness (inflammation) of the air pockets in the bones of your face (sinuses). The redness, soreness, and puffiness can cause air and mucus to get trapped in your sinuses. This can allow germs to grow and cause an infection.  HOME CARE   Drink enough fluids to keep your pee (urine) clear or pale yellow.  Use a humidifier in your home.  Run a hot shower to create steam in the bathroom. Sit in the bathroom with the door closed. Breathe in the steam 3-4 times a day.  Put a warm, moist washcloth on your face 3-4 times a day, or as told by your doctor.  Use salt water sprays (saline sprays) to wet the thick fluid in your nose. This can  help the sinuses drain.  Only take medicine as told by your doctor. GET HELP RIGHT AWAY IF:   Your pain gets worse.  You have very bad headaches.  You are sick to your stomach (nauseous).  You throw up (vomit).  You are very sleepy (drowsy) all the time.  Your face is puffy (swollen).  Your vision changes.  You have a stiff neck.  You have trouble breathing. MAKE SURE YOU:   Understand these instructions.  Will watch your condition.  Will get help right away if you are not doing well or get worse.   This information is not intended to replace advice given to you by your health care provider. Make sure you discuss any  questions you have with your health care provider.   Document Released: 08/12/2007 Document Revised: 03/16/2014 Document Reviewed: 09/29/2011 Elsevier Interactive Patient Education 2016 [place] the prescription meds as directed. Follow-up with your provider as needed.

## 2015-09-02 NOTE — ED Notes (Signed)
Sore throat since yesterday   Increased pain with swallowing this am

## 2015-09-05 LAB — CULTURE, GROUP A STREP (THRC)

## 2016-07-25 ENCOUNTER — Emergency Department
Admission: EM | Admit: 2016-07-25 | Discharge: 2016-07-25 | Disposition: A | Discharge status: Home / Self Care | Payer: Medicaid Other | Attending: Emergency Medicine | Admitting: Emergency Medicine

## 2016-07-25 ENCOUNTER — Encounter: Payer: Self-pay | Admitting: Emergency Medicine

## 2016-07-25 DIAGNOSIS — F17210 Nicotine dependence, cigarettes, uncomplicated: Secondary | ICD-10-CM | POA: Diagnosis not present

## 2016-07-25 DIAGNOSIS — J0100 Acute maxillary sinusitis, unspecified: Secondary | ICD-10-CM | POA: Diagnosis not present

## 2016-07-25 DIAGNOSIS — R0981 Nasal congestion: Secondary | ICD-10-CM | POA: Diagnosis present

## 2016-07-25 MED ORDER — TRAMADOL HCL 50 MG PO TABS: 50.0000 mg | ORAL_TABLET | Freq: Two times a day (BID) | ORAL | 0 refills | Status: DC | PRN

## 2016-07-25 MED ORDER — CLARITHROMYCIN 500 MG PO TABS: 500.0000 mg | ORAL_TABLET | Freq: Two times a day (BID) | ORAL | 0 refills | Status: AC

## 2016-07-25 MED ORDER — FEXOFENADINE-PSEUDOEPHED ER 60-120 MG PO TB12: 1.0000 | ORAL_TABLET | Freq: Two times a day (BID) | ORAL | 0 refills | Status: DC

## 2016-07-25 NOTE — ED Notes (Signed)
See triage note  States she developed some nasal congestion couple of days ago  Now having sinus pressure/headache for the past couple of days   Afebrile on arrival

## 2016-07-25 NOTE — ED Triage Notes (Signed)
Pt states was cleaning out her pool last week and went swimming, reports 3 days later her nose "got sore". Pt is noted to be congested at this time.

## 2016-07-25 NOTE — ED Provider Notes (Signed)
[facility] Emergency Department Provider Note   ____________________________________________   First MD Initiated Contact with Patient 07/25/16 1423     (approximate)  I have reviewed the triage vital signs and the nursing notes.   HISTORY  Chief Complaint Facial Pain    HPI Carol Small is a 31 y.o. female patient complain right anterior facial pain and nasal congestion for 1 week. Patient stated pain is worsening the past 3 days. Patient stated onset was started when she cleaned the swimming pool last week and went swimming. Patient states she's been congested ever since. Patient denies any fever or has a frontal headache. Patient denies vision disturbance or vertigo. Patient denies nausea, vomiting, or diarrhea. No palliative measures for her complaint. Patient states she had to leave work secondary to her complaint today.patient rates the pain as a 10 over 10. Patient described the pain as "achy/pressure".   Past Medical History:  Diagnosis Date  . GERD (gastroesophageal reflux disease)   . Hypertension    NO MEDS-NEVER FOLLOWED BACK UP WITH PCP  . S/P LEEP of cervix     There are no active problems to display for this patient.   Past Surgical History:  Procedure Laterality Date  . BREAST LUMPECTOMY Left 07/01/2015   Procedure: BREAST LUMPECTOMY;  Surgeon: Christene Lye, MD;  Location: [facility] ORS;  Service: General;  Laterality: Left;  . FOOT SURGERY      Prior to Admission medications   Medication Sig Start Date End Date Taking? Authorizing Provider  albuterol (PROVENTIL HFA;VENTOLIN HFA) 108 (90 Base) MCG/ACT inhaler Inhale 2 puffs into the lungs every 6 (six) hours as needed for wheezing or shortness of breath. 09/02/15   Menshew, Dannielle Karvonen, PA-C  amoxicillin (AMOXIL) 500 MG capsule Take 1 capsule (500 mg total) by mouth 3 (three) times daily. 09/02/15   Menshew, Dannielle Karvonen, PA-C  Aspirin-Salicylamide-Caffeine (BC  HEADACHE PO) Take 1 Dose by mouth as needed.    [provider]  benzonatate (TESSALON PERLES) 100 MG capsule Take 1 capsule (100 mg total) by mouth 3 (three) times daily as needed for cough (Take 1-2 per dose). 09/02/15   Menshew, Dannielle Karvonen, PA-C  calcium carbonate (TUMS - DOSED IN MG ELEMENTAL CALCIUM) 500 MG chewable tablet Chew 1 tablet by mouth as needed for indigestion or heartburn.    [provider]  clarithromycin (BIAXIN) 500 MG tablet Take 1 tablet (500 mg total) by mouth 2 (two) times daily. 07/25/16 08/08/16  Sable Feil, PA-C  etonogestrel (NEXPLANON) 68 MG IMPL implant 1 each by Subdermal route once.    [provider]  fexofenadine-pseudoephedrine (ALLEGRA-D) 60-120 MG 12 hr tablet Take 1 tablet by mouth 2 (two) times daily. 07/25/16   Sable Feil, PA-C  fluticasone (FLONASE) 50 MCG/ACT nasal spray Place 2 sprays into both nostrils daily. 09/02/15   Menshew, Dannielle Karvonen, PA-C  traMADol (ULTRAM) 50 MG tablet Take 1 tablet (50 mg total) by mouth every 6 (six) hours as needed. 07/01/15   Christene Lye, MD  traMADol (ULTRAM) 50 MG tablet Take 1 tablet (50 mg total) by mouth every 12 (twelve) hours as needed. 07/25/16   Sable Feil, PA-C    Allergies Patient has no known allergies.  Family History  Problem Relation Age of Onset  . Thyroid cancer Maternal Grandmother     Social History Social History  Substance Use Topics  . Smoking status: Current Every Day Smoker  Packs/day: 0.25    Years: 13.00    Types: Cigarettes  . Smokeless tobacco: Never Used  . Alcohol use 0.0 oz/week     Comment: occ    Review of Systems  Constitutional: No fever/chills Eyes: No visual changes. ENT: No sore throat. Nasal and sinus congestion Cardiovascular: Denies chest pain. Respiratory: Denies shortness of breath. Gastrointestinal: No abdominal pain.  No nausea, no vomiting.  No diarrhea.  No constipation. Skin: Negative for  rash. Neurological: positive for headaches, but denies focal weakness or numbness. Endocrine:hypertension  ____________________________________________   PHYSICAL EXAM:  VITAL SIGNS: ED Triage Vitals  Enc Vitals Group     BP 07/25/16 1345 (!) 148/105     Pulse Rate 07/25/16 1345 100     Resp 07/25/16 1345 18     Temp 07/25/16 1345 98.6 F (37 C)     Temp Source 07/25/16 1345 Oral     SpO2 07/25/16 1345 99 %     Weight 07/25/16 1346 179 lb (81.2 kg)     Height 07/25/16 1346 5\' 3"  (1.6 m)     Head Circumference --      Peak Flow --      Pain Score 07/25/16 1345 10     Pain Loc --      Pain Edu? --      Excl. in [place]? --     Constitutional: Alert and oriented. Well appearing and in no acute distress. Head: Atraumatic. Nose:right maxillary guarding with edentulous nasal turbinates. Mouth/Throat: Mucous membranes are moist.  Oropharynx non-erythematous.Postnasal drainage Neck: No stridor.  No cervical spine tenderness to palpation. Hematological/Lymphatic/Immunilogical: No cervical lymphadenopathy. Cardiovascular: Normal rate, regular rhythm. Grossly normal heart sounds.  Good peripheral circulation.elevated blood pressure Respiratory: Normal respiratory effort.  No retractions. Lungs CTAB. Neurologic:  Normal speech and language. No gross focal neurologic deficits are appreciated. No gait instability. Skin:  Skin is warm, dry and intact. No rash noted. Psychiatric: Mood and affect are normal. Speech and behavior are normal.  ____________________________________________   LABS (all labs ordered are listed, but only abnormal results are displayed)  Labs Reviewed - No data to display ____________________________________________  EKG   ____________________________________________  RADIOLOGY   ____________________________________________   PROCEDURES  Procedure(s) performed: None  Procedures  Critical Care performed:  No  ____________________________________________   INITIAL IMPRESSION / ASSESSMENT AND PLAN / ED COURSE  Pertinent labs & imaging results that were available during my care of the patient were reviewed by me and considered in my medical decision making (see chart for details).  Sinusitis with sinus headache. Patient given discharge care instructions. Patient given a work note. Patient advised follow-up family doctor if no improvement in 3 days.      ____________________________________________   FINAL CLINICAL IMPRESSION(S) / ED DIAGNOSES  Final diagnoses:  Acute non-recurrent maxillary sinusitis      NEW MEDICATIONS STARTED DURING THIS VISIT:  New Prescriptions   CLARITHROMYCIN (BIAXIN) 500 MG TABLET    Take 1 tablet (500 mg total) by mouth 2 (two) times daily.   FEXOFENADINE-PSEUDOEPHEDRINE (ALLEGRA-D) 60-120 MG 12 HR TABLET    Take 1 tablet by mouth 2 (two) times daily.   TRAMADOL (ULTRAM) 50 MG TABLET    Take 1 tablet (50 mg total) by mouth every 12 (twelve) hours as needed.     Note:  This document was prepared using Dragon voice recognition software and may include unintentional dictation errors.    Sable Feil, PA-C 07/25/16 1439    Lisa Roca,  MD 07/25/16 1527

## 2016-11-04 ENCOUNTER — Ambulatory Visit: Payer: Medicaid Other | Admitting: Obstetrics and Gynecology

## 2017-05-13 ENCOUNTER — Emergency Department
Admission: EM | Admit: 2017-05-13 | Discharge: 2017-05-13 | Disposition: A | Discharge status: Home / Self Care | Payer: Medicaid Other | Attending: Emergency Medicine | Admitting: Emergency Medicine

## 2017-05-13 ENCOUNTER — Encounter: Payer: Self-pay | Admitting: Emergency Medicine

## 2017-05-13 ENCOUNTER — Other Ambulatory Visit: Payer: Self-pay

## 2017-05-13 DIAGNOSIS — R1030 Lower abdominal pain, unspecified: Secondary | ICD-10-CM | POA: Diagnosis not present

## 2017-05-13 DIAGNOSIS — R0981 Nasal congestion: Secondary | ICD-10-CM | POA: Diagnosis not present

## 2017-05-13 DIAGNOSIS — R0989 Other specified symptoms and signs involving the circulatory and respiratory systems: Secondary | ICD-10-CM | POA: Insufficient documentation

## 2017-05-13 DIAGNOSIS — R112 Nausea with vomiting, unspecified: Secondary | ICD-10-CM | POA: Diagnosis present

## 2017-05-13 DIAGNOSIS — F17210 Nicotine dependence, cigarettes, uncomplicated: Secondary | ICD-10-CM | POA: Diagnosis not present

## 2017-05-13 DIAGNOSIS — I10 Essential (primary) hypertension: Secondary | ICD-10-CM | POA: Insufficient documentation

## 2017-05-13 DIAGNOSIS — M791 Myalgia, unspecified site: Secondary | ICD-10-CM | POA: Insufficient documentation

## 2017-05-13 DIAGNOSIS — R6883 Chills (without fever): Secondary | ICD-10-CM | POA: Insufficient documentation

## 2017-05-13 DIAGNOSIS — B349 Viral infection, unspecified: Secondary | ICD-10-CM | POA: Diagnosis not present

## 2017-05-13 DIAGNOSIS — R05 Cough: Secondary | ICD-10-CM | POA: Diagnosis not present

## 2017-05-13 DIAGNOSIS — R197 Diarrhea, unspecified: Secondary | ICD-10-CM | POA: Insufficient documentation

## 2017-05-13 DIAGNOSIS — Z79899 Other long term (current) drug therapy: Secondary | ICD-10-CM | POA: Diagnosis not present

## 2017-05-13 LAB — URINALYSIS, COMPLETE (UACMP) WITH MICROSCOPIC
Bacteria, UA: NONE SEEN
Bilirubin Urine: NEGATIVE
Glucose, UA: NEGATIVE mg/dL
Ketones, ur: NEGATIVE mg/dL
Leukocytes, UA: NEGATIVE
NITRITE: NEGATIVE
PH: 5 (ref 5.0–8.0)
Protein, ur: NEGATIVE mg/dL
Specific Gravity, Urine: 1.027 (ref 1.005–1.030)

## 2017-05-13 LAB — INFLUENZA PANEL BY PCR (TYPE A & B)
INFLBPCR: NEGATIVE
Influenza A By PCR: NEGATIVE

## 2017-05-13 MED ORDER — ONDANSETRON 8 MG PO TBDP
8.0000 mg | ORAL_TABLET | Freq: Once | ORAL | Status: AC
  Administered 2017-05-13: 8 mg via ORAL
  Filled 2017-05-13: qty 2

## 2017-05-13 MED ORDER — IBUPROFEN 600 MG PO TABS
600.0000 mg | ORAL_TABLET | Freq: Three times a day (TID) | ORAL | 0 refills | Status: DC | PRN
Start: 2017-05-13 — End: 2018-03-23

## 2017-05-13 MED ORDER — PROMETHAZINE-DM 6.25-15 MG/5ML PO SYRP: 5.0000 mL | ORAL_SOLUTION | Freq: Four times a day (QID) | ORAL | 0 refills | Status: DC | PRN

## 2017-05-13 MED ORDER — DICYCLOMINE HCL 10 MG PO CAPS
20.0000 mg | ORAL_CAPSULE | Freq: Once | ORAL | Status: AC
  Administered 2017-05-13: 20 mg via ORAL
  Filled 2017-05-13: qty 2

## 2017-05-13 MED ORDER — DICYCLOMINE HCL 20 MG PO TABS: 20.0000 mg | ORAL_TABLET | Freq: Three times a day (TID) | ORAL | 0 refills | Status: DC | PRN

## 2017-05-13 MED ORDER — ONDANSETRON HCL 8 MG PO TABS: 8.0000 mg | ORAL_TABLET | Freq: Three times a day (TID) | ORAL | 0 refills | Status: DC | PRN

## 2017-05-13 NOTE — ED Triage Notes (Signed)
Pt to ED c/o n/v/d that started today, emesis x1, diarrhea x3 today, denies flu shot.  States daughter had same symptoms on Monday.

## 2017-05-13 NOTE — ED Notes (Signed)
See triage note  States she developed some n/v/d this am with stomach cramping  Last time vomited and last diarrhea was PTA   Afebrile on arrival

## 2017-05-13 NOTE — ED Provider Notes (Signed)
[facility] Emergency Department Provider Note   ____________________________________________   First MD Initiated Contact with Patient 05/13/17 1332     (approximate)  I have reviewed the triage vital signs and the nursing notes.   HISTORY  Chief Complaint Nausea; Diarrhea; and Emesis    HPI Carol Small is a 32 y.o. female patient complain of acute onset of nausea, vomiting, and diarrhea today.  Patient complained of lower abdominal pain.  Patient described the pain as "achy".  No palliative measures for complaint.  Patient also complained of nasal congestion, runny nose, and cough.  Patient stated body aches and chills.  Patient unsure of fever.  Patient is here with mother and her daughter with the same complaint.  Patient has not taken a flu shot for this season.  Past Medical History:  Diagnosis Date  . GERD (gastroesophageal reflux disease)   . Hypertension    NO MEDS-NEVER FOLLOWED BACK UP WITH PCP  . S/P LEEP of cervix     There are no active problems to display for this patient.   Past Surgical History:  Procedure Laterality Date  . BREAST LUMPECTOMY Left 07/01/2015   Procedure: BREAST LUMPECTOMY;  Surgeon: Christene Lye, MD;  Location: [facility] ORS;  Service: General;  Laterality: Left;  . FOOT SURGERY      Prior to Admission medications   Medication Sig Start Date End Date Taking? Authorizing Provider  albuterol (PROVENTIL HFA;VENTOLIN HFA) 108 (90 Base) MCG/ACT inhaler Inhale 2 puffs into the lungs every 6 (six) hours as needed for wheezing or shortness of breath. 09/02/15   Menshew, Dannielle Karvonen, PA-C  amoxicillin (AMOXIL) 500 MG capsule Take 1 capsule (500 mg total) by mouth 3 (three) times daily. 09/02/15   Menshew, Dannielle Karvonen, PA-C  Aspirin-Salicylamide-Caffeine (BC HEADACHE PO) Take 1 Dose by mouth as needed.    [provider]  benzonatate (TESSALON PERLES) 100 MG capsule Take 1 capsule (100 mg total) by  mouth 3 (three) times daily as needed for cough (Take 1-2 per dose). 09/02/15   Menshew, Dannielle Karvonen, PA-C  calcium carbonate (TUMS - DOSED IN MG ELEMENTAL CALCIUM) 500 MG chewable tablet Chew 1 tablet by mouth as needed for indigestion or heartburn.    [provider]  dicyclomine (BENTYL) 20 MG tablet Take 1 tablet (20 mg total) by mouth 3 (three) times daily as needed for spasms. 05/13/17 05/13/18  Sable Feil, PA-C  etonogestrel (NEXPLANON) 68 MG IMPL implant 1 each by Subdermal route once.    [provider]  fexofenadine-pseudoephedrine (ALLEGRA-D) 60-120 MG 12 hr tablet Take 1 tablet by mouth 2 (two) times daily. 07/25/16   Sable Feil, PA-C  fluticasone (FLONASE) 50 MCG/ACT nasal spray Place 2 sprays into both nostrils daily. 09/02/15   Menshew, Dannielle Karvonen, PA-C  ibuprofen (ADVIL,MOTRIN) 600 MG tablet Take 1 tablet (600 mg total) by mouth every 8 (eight) hours as needed. 05/13/17   Sable Feil, PA-C  ondansetron (ZOFRAN) 8 MG tablet Take 1 tablet (8 mg total) by mouth every 8 (eight) hours as needed for nausea or vomiting. 05/13/17   Sable Feil, PA-C  promethazine-dextromethorphan (PROMETHAZINE-DM) 6.25-15 MG/5ML syrup Take 5 mLs by mouth 4 (four) times daily as needed for cough. 05/13/17   Sable Feil, PA-C  traMADol (ULTRAM) 50 MG tablet Take 1 tablet (50 mg total) by mouth every 6 (six) hours as needed. 07/01/15   Christene Lye, MD  traMADol (ULTRAM) 50 MG tablet Take 1 tablet (50 mg total) by mouth every 12 (twelve) hours as needed. 07/25/16   Sable Feil, PA-C    Allergies Patient has no known allergies.  Family History  Problem Relation Age of Onset  . Thyroid cancer Maternal Grandmother     Social History Social History   Tobacco Use  . Smoking status: Current Every Day Smoker    Packs/day: 0.25    Years: 13.00    Pack years: 3.25    Types: Cigarettes  . Smokeless tobacco: Never Used  Substance Use Topics  . Alcohol use: Yes      Alcohol/week: 0.0 oz    Comment: occ  . Drug use: No    Review of Systems Constitutional: Chills and body aches.  Eyes: No visual changes. ENT: Nasal congestion intermittent runny nose.   Cardiovascular: Denies chest pain. Respiratory: Denies shortness of breath. Gastrointestinal: No abdominal pain.  Nausea, vomiting, and diarrhea.  No constipation. Genitourinary: Negative for dysuria. Musculoskeletal: Negative for back pain. Skin: Negative for rash. Neurological: Negative for headaches, focal weakness or numbness.   ____________________________________________   PHYSICAL EXAM:  VITAL SIGNS: ED Triage Vitals [05/13/17 1313]  Enc Vitals Group     BP (!) 140/98     Pulse Rate (!) 114     Resp 18     Temp 98.1 F (36.7 C)     Temp Source Oral     SpO2 99 %     Weight 175 lb (79.4 kg)     Height 5\' 4"  (1.626 m)     Head Circumference      Peak Flow      Pain Score 6     Pain Loc      Pain Edu?      Excl. in [place]?    Constitutional: Alert and oriented. Well appearing and in no acute distress. Nose: Edematous nasal turbinates clear rhinorrhea. Mouth/Throat: Mucous membranes are moist.  Oropharynx non-erythematous.  Postnasal drainage. Neck: No stridor. Hematological/Lymphatic/Immunilogical: No cervical lymphadenopathy. Cardiovascular: Normal rate, regular rhythm. Grossly normal heart sounds.  Good peripheral circulation. Respiratory: Normal respiratory effort.  No retractions. Lungs CTAB. Gastrointestinal: Soft and nontender. No distention. No abdominal bruits. No CVA tenderness. Musculoskeletal: No lower extremity tenderness nor edema.  No joint effusions. Neurologic:  Normal speech and language. No gross focal neurologic deficits are appreciated. No gait instability. Skin:  Skin is warm, dry and intact. No rash noted. Psychiatric: Mood and affect are normal. Speech and behavior are normal.  ____________________________________________   LABS (all labs ordered are  listed, but only abnormal results are displayed)  Labs Reviewed  URINALYSIS, COMPLETE (UACMP) WITH MICROSCOPIC - Abnormal; Notable for the following components:      Result Value   Color, Urine YELLOW (*)    APPearance CLEAR (*)    Hgb urine dipstick MODERATE (*)    Squamous Epithelial / LPF 0-5 (*)    All other components within normal limits  INFLUENZA PANEL BY PCR (TYPE A & B)   ____________________________________________  EKG   ____________________________________________  RADIOLOGY  ED MD interpretation:    Official radiology report(s): No results found.  ____________________________________________   PROCEDURES  Procedure(s) performed: None  Procedures  Critical Care performed: No  ____________________________________________   INITIAL IMPRESSION / ASSESSMENT AND PLAN / ED COURSE  As part of my medical decision making, I reviewed the following data within the electronic MEDICAL RECORD NUMBER    Viral illness.  Discussed negative  flu results with patient.  Patient given discharge care instructions advised take medication as directed.  Patient advised follow-up PCP if no improvement in 3-5 days.      ____________________________________________   FINAL CLINICAL IMPRESSION(S) / ED DIAGNOSES  Final diagnoses:  Viral illness     ED Discharge Orders        Ordered    promethazine-dextromethorphan (PROMETHAZINE-DM) 6.25-15 MG/5ML syrup  4 times daily PRN     05/13/17 1507    ondansetron (ZOFRAN) 8 MG tablet  Every 8 hours PRN     05/13/17 1507    dicyclomine (BENTYL) 20 MG tablet  3 times daily PRN     05/13/17 1507    ibuprofen (ADVIL,MOTRIN) 600 MG tablet  Every 8 hours PRN     05/13/17 1507       Note:  This document was prepared using Dragon voice recognition software and may include unintentional dictation errors.    Sable Feil, PA-C 05/13/17 1510    Carrie Mew, MD 05/13/17 [phone]

## 2017-10-18 ENCOUNTER — Encounter: Payer: Self-pay | Admitting: Obstetrics and Gynecology

## 2017-10-18 ENCOUNTER — Ambulatory Visit (INDEPENDENT_AMBULATORY_CARE_PROVIDER_SITE_OTHER): Payer: Medicaid Other | Admitting: Obstetrics and Gynecology

## 2017-10-18 VITALS — BP 142/100 | HR 94 | Ht 64.0 in | Wt 178.0 lb

## 2017-10-18 DIAGNOSIS — Z3046 Encounter for surveillance of implantable subdermal contraceptive: Secondary | ICD-10-CM

## 2017-10-18 DIAGNOSIS — Z3049 Encounter for surveillance of other contraceptives: Secondary | ICD-10-CM

## 2017-10-18 DIAGNOSIS — Z30011 Encounter for initial prescription of contraceptive pills: Secondary | ICD-10-CM

## 2017-10-18 MED ORDER — NORETHINDRONE 0.35 MG PO TABS: 1.0000 | ORAL_TABLET | Freq: Every day | ORAL | 0 refills | Status: DC

## 2017-10-18 NOTE — Patient Instructions (Signed)
I value your feedback and entrusting us with your care. If you get a [place] patient survey, I would appreciate you taking the time to let us know about your experience today. Thank you! 

## 2017-10-18 NOTE — Progress Notes (Signed)
   Chief Complaint  Patient presents with  . Contraception    Nexplanon removal/ BC consult      History of Present Illness:  Carol Small is a 32 y.o. that had a nexplanon placed approximately 2 1/2 years  ago. Since that time, she has had irregular bleeding for the past few months and is tired of it. Would like to go back on OCPs. Did depo in past with wt gain and not interested in IUD. Hx of elevated blood pressures, most likely HTN. Pt plans to see PCP for tx since BP always elevated. Pt has annuals with PCP and last one was ~ 1/19 per pt report.   Past Medical History:  Diagnosis Date  . GERD (gastroesophageal reflux disease)   . Hypertension    NO MEDS-NEVER FOLLOWED BACK UP WITH PCP  . S/P LEEP of cervix    Past Surgical History:  Procedure Laterality Date  . BREAST LUMPECTOMY Left 07/01/2015   Procedure: BREAST LUMPECTOMY;  Surgeon: Christene Lye, MD;  Location: [facility] ORS;  Service: General;  Laterality: Left;  . FOOT SURGERY     Family History  Problem Relation Age of Onset  . Thyroid cancer Maternal Grandmother     Review of Systems  Constitutional: Negative for fever.  Gastrointestinal: Negative for blood in stool, constipation, diarrhea, nausea and vomiting.  Genitourinary: Positive for menstrual problem. Negative for dyspareunia, dysuria, flank pain, frequency, hematuria, urgency, vaginal bleeding, vaginal discharge and vaginal pain.  Musculoskeletal: Negative for back pain.  Skin: Negative for rash.   Vitals:   10/18/17 1426  Weight: 178 lb (80.7 kg)  Height: 5\' 4"  (1.626 m)      Physical Exam  Constitutional: She is oriented to person, place, and time. She appears well-developed.  Neck: Normal range of motion.  Respiratory: Effort normal.  Musculoskeletal: Normal range of motion.  Neurological: She is alert and oriented to person, place, and time.  Psychiatric: She has a normal mood and affect. Her behavior is normal. Thought content  normal.    Nexplanon removal Procedure note - The Nexplanon was noted in the patient's arm and the end was identified. The skin was cleansed with a Betadine solution. A small injection of subcutaneous lidocaine with epinephrine was given over the end of the implant. An incision was made at the end of the implant. The rod was noted in the incision and grasped with a hemostat. It was noted to be intact.  Steri-Strip was placed approximating the incision. Hemostasis was noted.  Assessment: Encounter for initial prescription of contraceptive pills - Prog only BC options discussed due to elevated BP/most likely HTN dx. Pt wants POPs. Rx camila. Condoms. PCP to manage future Rx. F/u prn - Plan: norethindrone (MICRONOR,CAMILA,ERRIN) 0.35 MG tablet  Nexplanon removal   Plan:   She was told to remove the dressing in 12-24 hours, to keep the incision area dry for 24 hours and to remove the Steristrip in 2-3  days.  Notify us if any signs of tenderness, redness, pain, or fevers develop.   Dontel Harshberger B. Ashland Wiseman, PA-C 10/18/2017 3:01 PM

## 2018-02-18 DIAGNOSIS — I10 Essential (primary) hypertension: Secondary | ICD-10-CM | POA: Insufficient documentation

## 2018-02-18 DIAGNOSIS — F329 Major depressive disorder, single episode, unspecified: Secondary | ICD-10-CM | POA: Insufficient documentation

## 2018-02-18 DIAGNOSIS — F39 Unspecified mood [affective] disorder: Secondary | ICD-10-CM | POA: Insufficient documentation

## 2018-03-14 ENCOUNTER — Other Ambulatory Visit: Payer: Self-pay

## 2018-03-14 ENCOUNTER — Emergency Department
Admission: EM | Admit: 2018-03-14 | Discharge: 2018-03-14 | Disposition: A | Discharge status: Home / Self Care | Payer: Medicaid Other | Attending: Emergency Medicine | Admitting: Emergency Medicine

## 2018-03-14 ENCOUNTER — Emergency Department: Payer: Medicaid Other

## 2018-03-14 DIAGNOSIS — O99331 Smoking (tobacco) complicating pregnancy, first trimester: Secondary | ICD-10-CM | POA: Insufficient documentation

## 2018-03-14 DIAGNOSIS — F17210 Nicotine dependence, cigarettes, uncomplicated: Secondary | ICD-10-CM | POA: Insufficient documentation

## 2018-03-14 DIAGNOSIS — Z3491 Encounter for supervision of normal pregnancy, unspecified, first trimester: Secondary | ICD-10-CM

## 2018-03-14 DIAGNOSIS — O10011 Pre-existing essential hypertension complicating pregnancy, first trimester: Secondary | ICD-10-CM | POA: Diagnosis not present

## 2018-03-14 DIAGNOSIS — O9989 Other specified diseases and conditions complicating pregnancy, childbirth and the puerperium: Secondary | ICD-10-CM | POA: Insufficient documentation

## 2018-03-14 DIAGNOSIS — R109 Unspecified abdominal pain: Secondary | ICD-10-CM | POA: Insufficient documentation

## 2018-03-14 LAB — COMPREHENSIVE METABOLIC PANEL
ALBUMIN: 3.7 g/dL (ref 3.5–5.0)
ALK PHOS: 42 U/L (ref 38–126)
ALT: 21 U/L (ref 0–44)
ANION GAP: 7 (ref 5–15)
AST: 19 U/L (ref 15–41)
BILIRUBIN TOTAL: 0.6 mg/dL (ref 0.3–1.2)
BUN: 17 mg/dL (ref 6–20)
CALCIUM: 8.8 mg/dL — AB (ref 8.9–10.3)
CO2: 25 mmol/L (ref 22–32)
Chloride: 106 mmol/L (ref 98–111)
Creatinine, Ser: 0.73 mg/dL (ref 0.44–1.00)
GFR calc Af Amer: >60 mL/min (ref >60)
GLUCOSE: 89 mg/dL (ref 70–99)
Potassium: 2.7 mmol/L — CL (ref 3.5–5.1)
Sodium: 138 mmol/L (ref 135–145)
TOTAL PROTEIN: 6.7 g/dL (ref 6.5–8.1)

## 2018-03-14 LAB — CBC
HCT: 37.9 % (ref 36.0–46.0)
Hemoglobin: 12.9 g/dL (ref 12.0–15.0)
MCH: 30.6 pg (ref 26.0–34.0)
MCHC: 34 g/dL (ref 30.0–36.0)
MCV: 90 fL (ref 80.0–100.0)
Platelets: 289 10*3/uL (ref 150–400)
RBC: 4.21 MIL/uL (ref 3.87–5.11)
RDW: 13.1 % (ref 11.5–15.5)
WBC: 15.1 10*3/uL — AB (ref 4.0–10.5)
nRBC: 0 % (ref 0.0–0.2)

## 2018-03-14 LAB — LIPASE, BLOOD: Lipase: 29 U/L (ref 11–51)

## 2018-03-14 LAB — HCG, QUANTITATIVE, PREGNANCY: HCG, BETA CHAIN, QUANT, S: 16412 m[IU]/mL — AB (ref <5)

## 2018-03-14 MED ORDER — POTASSIUM CHLORIDE CRYS ER 20 MEQ PO TBCR
40.0000 meq | EXTENDED_RELEASE_TABLET | Freq: Once | ORAL | Status: AC
  Administered 2018-03-14: 40 meq via ORAL
  Filled 2018-03-14: qty 2

## 2018-03-14 NOTE — ED Triage Notes (Signed)
Pt reports that she is having abd cramps x1 day - reports N/V/D - (vomited x2 in 24 hours - 7-8 loose stools in 24 hours) - pt states that she took a home pregnancy test and it was positive

## 2018-03-14 NOTE — ED Provider Notes (Signed)
[facility] Emergency Department Provider Note   ____________________________________________    I have reviewed the triage vital signs and the nursing notes.   HISTORY  Chief Complaint Abdominal Pain and Emesis     HPI Carol Small is a 33 y.o. female who presents with complaints of abdominal cramping and nausea and vomiting.  Patient also reports that she took a pregnancy test and found to be positive.  Her last period was in November.  She denies vaginal bleeding.  No fevers or chills, some body aches.  Has had some diarrhea.  Has not taken anything for this.  No sick contacts, no recent travel.   Past Medical History:  Diagnosis Date  . GERD (gastroesophageal reflux disease)   . Hypertension    NO MEDS-NEVER FOLLOWED BACK UP WITH PCP  . S/P LEEP of cervix     There are no active problems to display for this patient.   Past Surgical History:  Procedure Laterality Date  . BREAST LUMPECTOMY Left 07/01/2015   Procedure: BREAST LUMPECTOMY;  Surgeon: Christene Lye, MD;  Location: [facility] ORS;  Service: General;  Laterality: Left;  . FOOT SURGERY      Prior to Admission medications   Medication Sig Start Date End Date Taking? Authorizing Provider  albuterol (PROVENTIL HFA;VENTOLIN HFA) 108 (90 Base) MCG/ACT inhaler Inhale 2 puffs into the lungs every 6 (six) hours as needed for wheezing or shortness of breath. Patient not taking: Reported on 10/18/2017 09/02/15   Menshew, Dannielle Karvonen, PA-C  Aspirin-Salicylamide-Caffeine (BC HEADACHE PO) Take 1 Dose by mouth as needed.    [provider]  calcium carbonate (TUMS - DOSED IN MG ELEMENTAL CALCIUM) 500 MG chewable tablet Chew 1 tablet by mouth as needed for indigestion or heartburn.    [provider]  ibuprofen (ADVIL,MOTRIN) 600 MG tablet Take 1 tablet (600 mg total) by mouth every 8 (eight) hours as needed. Patient not taking: Reported on 10/18/2017 05/13/17   Sable Feil, PA-C  ketoconazole (NIZORAL) 2 % cream Apply twice daily to feet for 4 weeks, then 3 times per week. 01/02/16   [provider]  norethindrone (MICRONOR,CAMILA,ERRIN) 0.35 MG tablet Take 1 tablet (0.35 mg total) by mouth daily. 8/56/31   Copland, Elmo Putt B, PA-C  ondansetron (ZOFRAN) 8 MG tablet Take 1 tablet (8 mg total) by mouth every 8 (eight) hours as needed for nausea or vomiting. Patient not taking: Reported on 10/18/2017 05/13/17   Sable Feil, PA-C  traMADol (ULTRAM) 50 MG tablet Take 1 tablet (50 mg total) by mouth every 6 (six) hours as needed. Patient not taking: Reported on 10/18/2017 07/01/15   Christene Lye, MD  traMADol (ULTRAM) 50 MG tablet Take 1 tablet (50 mg total) by mouth every 12 (twelve) hours as needed. Patient not taking: Reported on 10/18/2017 07/25/16   Sable Feil, PA-C  tretinoin (RETIN-A) 0.025 % cream Apply topically. 04/27/17   [provider]     Allergies Erythromycin base and Nickel  Family History  Problem Relation Age of Onset  . Thyroid cancer Maternal Grandmother     Social History Social History   Tobacco Use  . Smoking status: Current Every Day Smoker    Packs/day: 0.50    Years: 13.00    Pack years: 6.50    Types: Cigarettes  . Smokeless tobacco: Never Used  Substance Use Topics  . Alcohol use: Not Currently    Alcohol/week: 0.0 standard  drinks    Comment: occ  . Drug use: No    Review of Systems  Constitutional: No fever/chills Eyes: No visual changes.  ENT: No sore throat. Cardiovascular: Denies chest pain. Respiratory: Denies shortness of breath. Gastrointestinal: As above Genitourinary: Negative for dysuria. Musculoskeletal: Myalgias Skin: Negative for rash. Neurological: Negative for headaches   ____________________________________________   PHYSICAL EXAM:  VITAL SIGNS: ED Triage Vitals  Enc Vitals Group     BP 03/14/18 1318 132/79     Pulse Rate 03/14/18 1318 88     Resp  03/14/18 1318 16     Temp 03/14/18 1318 98.3 F (36.8 C)     Temp Source 03/14/18 1318 Oral     SpO2 03/14/18 1318 97 %     Weight 03/14/18 1320 77.1 kg (170 lb)     Height 03/14/18 1320 1.575 m (5\' 2" )     Head Circumference --      Peak Flow --      Pain Score 03/14/18 1319 7     Pain Loc --      Pain Edu? --      Excl. in [place]? --     Constitutional: Alert and oriented.  Eyes: Conjunctivae are normal.   Nose: No congestion/rhinnorhea. Mouth/Throat: Mucous membranes are moist.    Cardiovascular: Normal rate, regular rhythm. Grossly normal heart sounds.  Good peripheral circulation. Respiratory: Normal respiratory effort.  No retractions. Lungs CTAB. Gastrointestinal: Soft and nontender. No distention.   Genitourinary: deferred Musculoskeletal: No lower extremity tenderness nor edema.  Warm and well perfused Neurologic:  Normal speech and language. No gross focal neurologic deficits are appreciated.  Skin:  Skin is warm, dry and intact. No rash noted. Psychiatric: Mood and affect are normal. Speech and behavior are normal.  ____________________________________________   LABS (all labs ordered are listed, but only abnormal results are displayed)  Labs Reviewed  COMPREHENSIVE METABOLIC PANEL - Abnormal; Notable for the following components:      Result Value   Potassium 2.7 (*)    Calcium 8.8 (*)    All other components within normal limits  CBC - Abnormal; Notable for the following components:   WBC 15.1 (*)    All other components within normal limits  HCG, QUANTITATIVE, PREGNANCY - Abnormal; Notable for the following components:   hCG, Beta Chain, Quant, S 16,412 (*)    All other components within normal limits  LIPASE, BLOOD   ____________________________________________  EKG  None ____________________________________________  RADIOLOGY  Ultrasound demonstrates IUP, discussed bradycardia with patient need for outpatient repeat  follow-up ____________________________________________   PROCEDURES  Procedure(s) performed: No  Procedures   Critical Care performed: No ____________________________________________   INITIAL IMPRESSION / ASSESSMENT AND PLAN / ED COURSE  Pertinent labs & imaging results that were available during my care of the patient were reviewed by me and considered in my medical decision making (see chart for details).  She presents with elevated beta-hCG confirming her pregnancy, ultrasound ordered because of her abdominal cramping although this is likely related to gastroenteritis given her nausea vomiting and diarrhea.  Ultrasound demonstrates IUP with some bradycardia which I discussed with the patient.  Work significant for hypokalemia which was repleted and elevated white blood cell count likely related to gastroenteritis.  Recommend supportive care, outpatient follow-up.  Return precautions discussed    ____________________________________________   FINAL CLINICAL IMPRESSION(S) / ED DIAGNOSES  Final diagnoses:  First trimester pregnancy        Note:  This document was  prepared using Systems analyst and may include unintentional dictation errors.   Lavonia Drafts, MD 03/14/18 2320

## 2018-03-14 NOTE — Discharge Instructions (Addendum)
Please follow up closely with your OB. The fetal heart rate was lower than expected and this will require a repeat ultrasound.

## 2018-03-23 ENCOUNTER — Ambulatory Visit (INDEPENDENT_AMBULATORY_CARE_PROVIDER_SITE_OTHER): Payer: Medicaid Other | Admitting: Advanced Practice Midwife

## 2018-03-23 ENCOUNTER — Encounter: Payer: Self-pay | Admitting: Advanced Practice Midwife

## 2018-03-23 ENCOUNTER — Other Ambulatory Visit (HOSPITAL_COMMUNITY)
Admission: RE | Admit: 2018-03-23 | Discharge: 2018-03-23 | Disposition: A | Discharge status: Home / Self Care | Payer: Medicaid Other | Source: Ambulatory Visit | Attending: Advanced Practice Midwife | Admitting: Advanced Practice Midwife

## 2018-03-23 ENCOUNTER — Encounter

## 2018-03-23 VITALS — BP 140/84 | Wt 177.0 lb

## 2018-03-23 DIAGNOSIS — Z124 Encounter for screening for malignant neoplasm of cervix: Secondary | ICD-10-CM

## 2018-03-23 DIAGNOSIS — O0991 Supervision of high risk pregnancy, unspecified, first trimester: Secondary | ICD-10-CM

## 2018-03-23 DIAGNOSIS — Z3A01 Less than 8 weeks gestation of pregnancy: Secondary | ICD-10-CM

## 2018-03-23 DIAGNOSIS — Z113 Encounter for screening for infections with a predominantly sexual mode of transmission: Secondary | ICD-10-CM

## 2018-03-23 DIAGNOSIS — O99210 Obesity complicating pregnancy, unspecified trimester: Secondary | ICD-10-CM | POA: Insufficient documentation

## 2018-03-23 DIAGNOSIS — O99211 Obesity complicating pregnancy, first trimester: Secondary | ICD-10-CM

## 2018-03-23 DIAGNOSIS — O0990 Supervision of high risk pregnancy, unspecified, unspecified trimester: Secondary | ICD-10-CM

## 2018-03-23 NOTE — Progress Notes (Addendum)
New Obstetric Patient H&P    Chief Complaint: "Desires prenatal care"   History of Present Illness: Patient is a 33 y.o. [id number] Not Hispanic or Latino female, presents with amenorrhea and positive home pregnancy test. Patient's last menstrual period was 01/26/2018 (exact date). and based on [redacted]w[redacted]d ultrasound on 03/14/2018, her EDD is Estimated Date of Delivery: 11/09/18 and her EGA is [redacted]w[redacted]d. Cycles are 7-10 days. days, regular, and occur approximately every : 30 days. She is uncertain when her last PAP smear was and is unaware of ever having any abnormal PAPs. She has history of LEEP on her problem list. She denies ever having any cervical procedure. She has had a benign left breast tumor excised in April of 2017.   She had a urine pregnancy test which was positive about 2 week(s)  ago. Her last menstrual period was normal and lasted for  7 or 8 day(s). Since her LMP she claims she has experienced breast tenderness, fatigue, nausea, vomiting, headache, hot flashes. She denies vaginal bleeding. Her past medical history is contributory for chronic hypertension for the past 7 years. Her prior pregnancies are notable for 2012 SAB, 2013 FT c/section for FITL. She denies hypertension or DM during the pregnancy.  Since her LMP, she admits to the use of tobacco products  Yes she has decreased from 1/2 PPD to 3 c/d She claims she has gained   6 pounds since the start of her pregnancy.  There are cats in the home in the home  no  She admits close contact with children on a regular basis  yes  She has had chicken pox in the past yes She has had Tuberculosis exposures, symptoms, or previously tested positive for TB   no Current or past history of domestic violence. no  Genetic Screening/Teratology Counseling: (Includes patient, baby's father, or anyone in either family with:)   50. Patient's age >/= 57 at [facility]  no 2. Thalassemia ([place], [place], [place], or Asian background): MCV<80  no 3. Neural tube  defect (meningomyelocele, spina bifida, anencephaly)  no 4. Congenital heart defect  no  5. Down syndrome  no 6. Tay-Sachs (Jewish, [place])  no 7. Canavan's Disease  no 8. Sickle cell disease or trait (African)  no  9. Hemophilia or other blood disorders  no  10. Muscular dystrophy  no  11. Cystic fibrosis  no  12. Huntington's Chorea  no  13. Mental retardation/autism  no 14. Other inherited genetic or chromosomal disorder  no 15. Maternal metabolic disorder (DM, PKU, etc)  no 16. Patient or FOB with a child with a birth defect not listed above no  16a. Patient or FOB with a birth defect themselves no 17. Recurrent pregnancy loss, or stillbirth  no  18. Any medications since LMP other than prenatal vitamins (include vitamins, supplements, OTC meds, drugs, alcohol)  No. Reviewed hypertension and depression/anxiety meds and discussed ok to take. She saw her hypertension doctor yesterday and had not started taking the recommended Labetalol 100 mg BID. 19. Any other genetic/environmental exposure to discuss  no  Infection History:   1. Lives with someone with TB or TB exposed  no  2. Patient or partner has history of genital herpes  no 3. Rash or viral illness since LMP  no 4. History of STI (GC, CT, HPV, syphilis, HIV)  no 5. History of recent travel :  no  Other pertinent information:  New FOB     Review of Systems:10 point review of  systems negative unless otherwise noted in HPI  Past Medical History:  Past Medical History:  Diagnosis Date  . GERD (gastroesophageal reflux disease)   . Hypertension    NO MEDS-NEVER FOLLOWED BACK UP WITH PCP  . S/P LEEP of cervix     Past Surgical History:  Past Surgical History:  Procedure Laterality Date  . BREAST LUMPECTOMY Left 07/01/2015   Procedure: BREAST LUMPECTOMY;  Surgeon: Christene Lye, MD;  Location: [facility] ORS;  Service: General;  Laterality: Left;  . FOOT SURGERY      Gynecologic History: Patient's last  menstrual period was 01/26/2018 (exact date).  Obstetric History: G3P1011  Family History:  Family History  Problem Relation Age of Onset  . Thyroid cancer Maternal Grandmother     Social History:  Social History   Socioeconomic History  . Marital status: Single    Spouse name: Not on file  . Number of children: Not on file  . Years of education: Not on file  . Highest education level: Not on file  Occupational History  . Not on file  Social Needs  . Financial resource strain: Not on file  . Food insecurity:    Worry: Not on file    Inability: Not on file  . Transportation needs:    Medical: Not on file    Non-medical: Not on file  Tobacco Use  . Smoking status: Current Every Day Smoker    Packs/day: 0.50    Years: 13.00    Pack years: 6.50    Types: Cigarettes  . Smokeless tobacco: Never Used  Substance and Sexual Activity  . Alcohol use: Not Currently    Alcohol/week: 0.0 standard drinks    Comment: occ  . Drug use: No  . Sexual activity: Yes    Birth control/protection: None  Lifestyle  . Physical activity:    Days per week: Not on file    Minutes per session: Not on file  . Stress: Not on file  Relationships  . Social connections:    Talks on phone: Not on file    Gets together: Not on file    Attends religious service: Not on file    Active member of club or organization: Not on file    Attends meetings of clubs or organizations: Not on file    Relationship status: Not on file  . Intimate partner violence:    Fear of current or ex partner: Not on file    Emotionally abused: Not on file    Physically abused: Not on file    Forced sexual activity: Not on file  Other Topics Concern  . Not on file  Social History Narrative  . Not on file    Allergies:  Allergies  Allergen Reactions  . Erythromycin Base Other (See Comments)    Stomach cramps  . Nickel Rash    Medications: Prior to Admission medications   Medication Sig Start Date End Date  Taking? Authorizing Provider  escitalopram (LEXAPRO) 10 MG tablet  02/18/18  Yes [provider]  labetalol (NORMODYNE) 100 MG tablet Take by mouth. 03/21/18 03/21/19 Yes [provider]  calcium carbonate (TUMS - DOSED IN MG ELEMENTAL CALCIUM) 500 MG chewable tablet Chew 1 tablet by mouth as needed for indigestion or heartburn.    [provider]    Physical Exam Vitals: Blood pressure 140/84, weight 177 lb (80.3 kg), last menstrual period 01/26/2018.  General: NAD HEENT: normocephalic, anicteric Thyroid: no enlargement, no palpable nodules Pulmonary: No  increased work of breathing, CTAB Cardiovascular: RRR, distal pulses 2+ Abdomen: NABS, soft, non-tender, non-distended.  Umbilicus without lesions.  No hepatomegaly, splenomegaly or masses palpable. No evidence of hernia  Genitourinary:  External: Normal external female genitalia.  Normal urethral meatus, normal  Bartholin's and Skene's glands.    Vagina: Normal vaginal mucosa, no evidence of prolapse.    Cervix: Grossly normal in appearance, no bleeding, no CMT  Uterus:  Enlarged, mobile, normal contour.    Adnexa: ovaries non-enlarged, no adnexal masses  Rectal: deferred Extremities: no edema, erythema, or tenderness Neurologic: Grossly intact Psychiatric: mood appropriate, affect full   Assessment: 33 y.o. G3P1011 at [redacted]w[redacted]d presenting to initiate prenatal care  Plan: 1) Avoid alcoholic beverages. 2) Patient encouraged not to smoke.  3) Discontinue the use of all non-medicinal drugs and chemicals.  4) Take prenatal vitamins daily.  5) Nutrition, food safety (fish, cheese advisories, and high nitrite foods) and exercise discussed. Encouraged decrease intake of sugar sweetened beverages and increase h2o for optimum weight gain and decrease risk for complications of pregnancy. 6) Hospital and practice style discussed with cross coverage system.  7) Genetic Screening, such as with 1st Trimester Screening,  cell free fetal DNA, AFP testing, and Ultrasound, as well as with amniocentesis and CVS as appropriate, is discussed with patient. At the conclusion of today's visit patient requested cell free DNA genetic testing 8) Patient is asked about travel to areas at risk for the Zika virus, and counseled to avoid travel and exposure to mosquitoes or sexual partners who may have themselves been exposed to the virus. Testing is discussed, and will be ordered as appropriate.  9) PAPtima and urine culture today. Patient plans to go to [facility] today to switch to pregnancy medicaid.  10) All other NOB labs including early glucola, [id number], baseline PIH to be done at 10+ weeks 11) Return in 1 week for viability scan and rob   Rod Can, [place], [facility] 03/23/2018, 11:13 AM

## 2018-03-23 NOTE — Patient Instructions (Addendum)
Hypertension During Pregnancy  Hypertension, commonly called high blood pressure, is when the force of blood pumping through your arteries is too strong. Arteries are blood vessels that carry blood from the heart throughout the body. Hypertension during pregnancy can cause problems for you and your baby. Your baby may be born early (prematurely) or may not weigh as much as he or she should at birth. Very bad cases of hypertension during pregnancy can be life-threatening. Different types of hypertension can occur during pregnancy. These include:  Chronic hypertension. This happens when: ? You have hypertension before pregnancy and it continues during pregnancy. ? You develop hypertension before you are [redacted] weeks pregnant, and it continues during pregnancy.  Gestational hypertension. This is hypertension that develops after the 20th week of pregnancy.  Preeclampsia, also called toxemia of pregnancy. This is a very serious type of hypertension that develops during pregnancy. It can be very dangerous for you and your baby. ? In rare cases, you may develop preeclampsia after giving birth (postpartum preeclampsia). This usually occurs within 48 hours after childbirth but may occur up to 6 weeks after giving birth. Gestational hypertension and preeclampsia usually go away within 6 weeks after your baby is born. Women who have hypertension during pregnancy have a greater chance of developing hypertension later in life or during future pregnancies. What are the causes? The exact cause of hypertension during pregnancy is not known. What increases the risk? There are certain factors that make it more likely for you to develop hypertension during pregnancy. These include:  Having hypertension during a previous pregnancy or prior to pregnancy.  Being overweight.  Being age 50 or older.  Being pregnant for the first time.  Being pregnant with more than one baby.  Becoming pregnant using fertilization  methods such as IVF (in vitro fertilization).  Having diabetes, kidney problems, or systemic lupus erythematosus.  Having a family history of hypertension. What are the signs or symptoms? Chronic hypertension and gestational hypertension rarely cause symptoms. Preeclampsia causes symptoms, which may include:  Increased protein in your urine. Your health care provider will check for this at every visit before you give birth (prenatal visit).  Severe headaches.  Sudden weight gain.  Swelling of the hands, face, legs, and feet.  Nausea and vomiting.  Vision problems, such as blurred or double vision.  Numbness in the face, arms, legs, and feet.  Dizziness.  Slurred speech.  Sensitivity to bright lights.  Abdominal pain.  Convulsions or seizures. How is this diagnosed? You may be diagnosed with hypertension during a routine prenatal exam. At each prenatal visit, you may:  Have a urine test to check for high amounts of protein in your urine.  Have your blood pressure checked. A blood pressure reading is given as two numbers, such as "120 over 80" (or 120/80). The first ("top") number is a measure of the pressure in your arteries when your heart beats (systolic pressure). The second ("bottom") number is a measure of the pressure in your arteries as your heart relaxes between beats (diastolic pressure). Blood pressure is measured in a unit called mm Hg. For most women, a normal blood pressure reading is: ? Systolic: below 093. ? Diastolic: below 80. The type of hypertension that you are diagnosed with depends on your test results and when your symptoms developed.  Chronic hypertension is usually diagnosed before 20 weeks of pregnancy.  Gestational hypertension is usually diagnosed after 20 weeks of pregnancy.  Hypertension with high amounts of protein in  the urine is diagnosed as preeclampsia.  Blood pressure measurements that stay above 161 systolic, or above 096 diastolic,  are signs of severe preeclampsia. How is this treated? Treatment for hypertension during pregnancy varies depending on the type of hypertension you have and how serious it is.  If you take medicines called ACE inhibitors to treat chronic hypertension, you may need to switch medicines. ACE inhibitors should not be taken during pregnancy.  If you have gestational hypertension, you may need to take blood pressure medicine.  If you are at risk for preeclampsia, your health care provider may recommend that you take a low-dose aspirin during your pregnancy.  If you have severe preeclampsia, you may need to be hospitalized so you and your baby can be monitored closely. You may also need to take medicine (magnesium sulfate) to prevent seizures and to lower blood pressure. This medicine may be given as an injection or through an IV.  In some cases, if your condition gets worse, you may need to deliver your baby early. Follow these instructions at home: Eating and drinking   Drink enough fluid to keep your urine pale yellow.  Avoid caffeine. Lifestyle  Do not use any products that contain nicotine or tobacco, such as cigarettes and e-cigarettes. If you need help quitting, ask your health care provider.  Do not use alcohol or drugs.  Avoid stress as much as possible. Rest and get plenty of sleep. General instructions  Take over-the-counter and prescription medicines only as told by your health care provider.  While lying down, lie on your left side. This keeps pressure off your major blood vessels.  While sitting or lying down, raise (elevate) your feet. Try putting some pillows under your lower legs.  Exercise regularly. Ask your health care provider what kinds of exercise are best for you.  Keep all prenatal and follow-up visits as told by your health care provider. This is important. Contact a health care provider if:  You have symptoms that your health care provider told you may  require more treatment or monitoring, such as: ? Nausea or vomiting. ? Headache. Get help right away if you have:  Severe abdominal pain that does not get better with treatment.  A severe headache that does not get better.  Vomiting that does not get better.  Sudden, rapid weight gain.  Sudden swelling in your hands, ankles, or face.  Vaginal bleeding.  Blood in your urine.  Fewer movements from your baby than usual.  Blurred or double vision.  Muscle twitching or sudden muscle tightening (spasms).  Shortness of breath.  Blue fingernails or lips. Summary  Hypertension, commonly called high blood pressure, is when the force of blood pumping through your arteries is too strong.  Hypertension during pregnancy can cause problems for you and your baby.  Treatment for hypertension during pregnancy varies depending on the type of hypertension you have and how serious it is.  Get help right away if you have symptoms that your health care provider told you to watch for. This information is not intended to replace advice given to you by your health care provider. Make sure you discuss any questions you have with your health care provider. Document Released: 11/11/2010 Document Revised: 02/09/2017 Document Reviewed: 08/09/2015 Elsevier Interactive Patient Education  2019 [place] Depression When a woman feels excessive sadness, anger, or anxiety during pregnancy or during the first 12 months after she gives birth, she has a condition called perinatal depression. Depression can interfere  with work, school, relationships, and other everyday activities. If it is not managed properly, it can also cause problems in the mother and her baby. Sometimes, perinatal depression is left untreated because symptoms are thought to be normal mood swings during and right after pregnancy. If you have symptoms of depression, it is important to talk with your health care provider. What are  the causes? The exact cause of this condition is not known. Hormonal changes during and after pregnancy may play a role in causing perinatal depression. What increases the risk? You are more likely to develop this condition if:  You have a personal or family history of depression, anxiety, or mood disorders.  You experience a stressful life event during pregnancy, such as the death of a loved one.  You have a lot of regular life stress.  You do not have support from family members or loved ones, or you are in an abusive relationship. What are the signs or symptoms? Symptoms of this condition include:  Feeling sad or hopeless.  Feelings of guilt.  Feeling irritable or overwhelmed.  Changes in your appetite.  Lack of energy or motivation.  Sleep problems.  Difficulty concentrating or completing tasks.  Loss of interest in hobbies or relationships.  Headaches or stomach problems that do not go away. How is this diagnosed? This condition is diagnosed based on a physical exam and mental evaluation. In some cases, your health care provider may use a depression screening tool. These tools include a list of questions that can help a health care provider diagnose depression. Your health care provider may refer you to a mental health expert who specializes in depression. How is this treated? This condition may be treated with:  Medicines. Your health care provider will only give you medicines that have been proven safe for pregnancy and breastfeeding.  Talk therapy with a mental health professional to help change your patterns of thinking (cognitive behavioral therapy).  Support groups.  Brain stimulation or light therapies.  Stress reduction therapies, such as mindfulness. Follow these instructions at home: Lifestyle  Do not use any products that contain nicotine or tobacco, such as cigarettes and e-cigarettes. If you need help quitting, ask your health care provider.  Do  not use alcohol when you are pregnant. After your baby is born, limit alcohol intake to no more than 1 drink a day. One drink equals 12 oz of beer, 5 oz of wine, or 1 oz of hard liquor.  Consider joining a support group for new mothers. Ask your health care provider for recommendations.  Take good care of yourself. Make sure you: ? Get plenty of sleep. If you are having trouble sleeping, talk with your health care provider. ? Eat a healthy diet. This includes plenty of fruits and vegetables, whole grains, and lean proteins. ? Exercise regularly, as told by your health care provider. Ask your health care provider what exercises are safe for you. General instructions  Take over-the-counter and prescription medicines only as told by your health care provider.  Talk with your partner or family members about your feelings during pregnancy. Share any concerns or anxieties that you may have.  Ask for help with tasks or chores when you need it. Ask friends and family members to provide meals, watch your children, or help with cleaning.  Keep all follow-up visits as told by your health care provider. This is important. Contact a health care provider if:  You (or people close to you) notice that you  have any symptoms of depression.  You have depression and your symptoms get worse.  You experience side effects from medicines, such as nausea or sleep problems. Get help right away if:  You feel like hurting yourself, your baby, or someone else. If you ever feel like you may hurt yourself or others, or have thoughts about taking your own life, get help right away. You can go to your nearest emergency department or call:  Your local emergency services (911 in the [place]  A suicide crisis helpline, such as the [facility] at (571) 107-9819. This is open 24 hours a day. Summary  Perinatal depression is when a woman feels excessive sadness, anger, or anxiety during pregnancy  or during the first 12 months after she gives birth.  If perinatal depression is not treated, it can lead to health problems for the mother and her baby.  This condition is treated with medicines, talk therapy, stress reduction therapies, or a combination of two or more treatments.  Talk with your partner or family members about your feelings. Do not be afraid to ask for help. This information is not intended to replace advice given to you by your health care provider. Make sure you discuss any questions you have with your health care provider. Document Released: 04/22/2016 Document Revised: 04/22/2016 Document Reviewed: 04/22/2016 Elsevier Interactive Patient Education  2019 [employer]. Exercise During Pregnancy For people of all ages, exercise is an important part of being healthy. Exercise improves heart and lung function and helps to maintain strength, flexibility, and a healthy body weight. Exercise also boosts energy levels and elevates mood. For most women, maintaining an exercise routine throughout pregnancy is recommended. It is only on rare occasions and with certain medical conditions or pregnancy complications that women may be asked to limit or avoid exercise during pregnancy. What are some other benefits to exercising during pregnancy? Along with maintaining strength and flexibility, exercising throughout pregnancy can help to:  Keep strength in muscles that are very important during labor and childbirth.  Decrease low back pain during pregnancy.  Decrease the risk of developing gestational diabetes mellitus (GDM).  Improve blood sugar (glucose) control for women who have GDM.  Decrease the risk of developing preeclampsia. This is a serious condition that causes high blood pressure along with other symptoms, such as swelling and headaches.  Decrease the risk of cesarean delivery.  Speed up the recovery after giving birth. How often should I exercise? Unless your health  care provider gives you different instructions, you should try to exercise on most days or all days of the week. In general, try to exercise with moderate intensity for about 150 minutes per week. This can be spread out across several days, such as exercising for 30 minutes per day on 5 days of each week. You can tell that you are exercising at a moderate intensity if you have a higher heart rate and faster breathing, but you are still able to hold a conversation. What types of moderate-intensity exercise are recommended during pregnancy? There are many types of exercise that are safe for you to do during pregnancy. Unless your health care provider gives you different instructions, do a variety of exercises that safely increase your heart and breathing (cardiopulmonary) rates and help you to build and maintain muscle strength (strength training). You should always be able to talk in full sentences while exercising during pregnancy. Some examples of exercising that is safe to do during pregnancy include:  Brisk walking  or hiking.  Swimming.  Water aerobics.  Riding a stationary bike.  Strength training.  Modified yoga or Pilates. Tell your instructor that you are pregnant. Avoid overstretching and avoid lying on your back for long periods of time.  Running or jogging. Only choose this type of exercise if: ? You ran or jogged regularly before your pregnancy. ? You can run or jog and still talk in complete sentences. What types of exercise should I not do during pregnancy? Depending on your level of fitness and whether you exercised regularly before your pregnancy, you may be advised to limit vigorous-intensity exercise during your pregnancy. You can tell that you are exercising at a vigorous intensity if you are breathing much harder and faster and cannot hold a conversation while exercising. Some examples of exercising that you should avoid during pregnancy include:  Contact  sports.  Activities that place you at risk for falling on or being hit in the belly, such as downhill skiing, water skiing, surfing, rock climbing, cycling, gymnastics, and horseback riding.  Scuba diving.  Sky diving.  Yoga or Pilates in a room that is heated to extreme temperatures ("hot yoga" or "hot Pilates").  Jogging or running, unless you ran or jogged regularly before your pregnancy. While jogging or running, you should always be able to talk in full sentences. Do not run or jog so vigorously that you are unable to have a conversation.  If you are not used to exercising at elevation (more than 6,000 feet above sea level), do not do so during your pregnancy. When should I avoid exercising during pregnancy? Certain medical conditions can make it unsafe to exercise during pregnancy, or they may increase your risk of miscarriage or early labor and birth. Some of these conditions include:  Some types of heart disease.  Some types of lung disease.  Placenta previa. This is when the placenta partially or completely covers the opening of the uterus (cervix).  Frequent bleeding from the vagina during your pregnancy.  Incompetent cervix. This is when your cervix does not remain as tightly closed during pregnancy as it should.  Premature labor.  Ruptured membranes. This is when the protective sac (amniotic sac) opens up and amniotic fluid leaks from your vagina.  Severely low blood count (anemia).  Preeclampsia or pregnancy-caused high blood pressure.  Carrying more than one baby (multiple gestation) and having an additional risk of early labor.  Poorly controlled diabetes.  Being severely underweight or severely overweight.  Intrauterine growth restriction. This is when your baby's growth and development during pregnancy are slower than expected.  Other medical conditions. Ask your health care provider if any apply to you. What else should I know about exercising during  pregnancy? You should take these precautions while exercising during pregnancy:  Avoid overheating. ? Wear loose-fitting, breathable clothes. ? Do not exercise in very high temperatures.  Avoid dehydration. Drink enough water before, during, and after exercise to keep your urine clear or pale yellow.  Avoid overstretching. Because of hormone changes during pregnancy, it is easy to overstretch muscles, tendons, and ligaments during pregnancy.  Start slowly and ask your health care provider to recommend types of exercise that are safe for you, if exercising regularly is new for you. Pregnancy is not a time for exercising to lose weight. When should I seek medical care? You should stop exercising and call your health care provider if you have any unusual symptoms, such as:  Mild uterine contractions or abdominal cramping.  Dizziness  that does not improve with rest. When should I seek immediate medical care? You should stop exercising and call your local emergency services (911 in the [place] if you have any unusual symptoms, such as:  Sudden, severe pain in your low back or your belly.  Uterine contractions or abdominal cramping that do not improve with rest.  Chest pain.  Bleeding or fluid leaking from your vagina.  Shortness of breath. This information is not intended to replace advice given to you by your health care provider. Make sure you discuss any questions you have with your health care provider. Document Released: 02/23/2005 Document Revised: 07/24/2015 Document Reviewed: 05/03/2014 Elsevier Interactive Patient Education  2019 [place] for Pregnant Women While you are pregnant, your body requires additional nutrition to help support your growing baby. You also have a higher need for some vitamins and minerals, such as folic acid, calcium, iron, and vitamin D. Eating a healthy, well-balanced diet is very important for your health and your baby's health. Your  need for extra calories varies for the three 25-month segments of your pregnancy (trimesters). For most women, it is recommended to consume:  150 extra calories a day during the first trimester.  300 extra calories a day during the second trimester.  300 extra calories a day during the third trimester. What are tips for following this plan?   Do not try to lose weight or go on a diet during pregnancy.  Limit your overall intake of foods that have "empty calories." These are foods that have little nutritional value, such as sweets, desserts, candies, and sugar-sweetened beverages.  Eat a variety of foods (especially fruits and vegetables) to get a full range of vitamins and minerals.  Take a prenatal vitamin to help meet your additional vitamin and mineral needs during pregnancy, specifically for folic acid, iron, calcium, and vitamin D.  Remember to stay active. Ask your health care provider what types of exercise and activities are safe for you.  Practice good food safety and cleanliness. Wash your hands before you eat and after you prepare raw meat. Wash all fruits and vegetables well before peeling or eating. Taking these actions can help to prevent food-borne illnesses that can be very dangerous to your baby, such as listeriosis. Ask your health care provider for more information about listeriosis. What does 150 extra calories look like? Healthy options that provide 150 extra calories each day could be any of the following:  6-8 oz (170-230 g) of plain low-fat yogurt with  cup of berries.  1 apple with 2 teaspoons (11 g) of peanut butter.  Cut-up vegetables with  cup (60 g) of hummus.  8 oz (230 mL) or 1 cup of low-fat chocolate milk.  1 stick of string cheese with 1 medium orange.  1 peanut butter and jelly sandwich that is made with one slice of whole-wheat bread and 1 tsp (5 g) of peanut butter. For 300 extra calories, you could eat two of those healthy options each  day. What is a healthy amount of weight to gain? The right amount of weight gain for you is based on your BMI before you became pregnant. If your BMI:  Was less than 18 (underweight), you should gain 28-40 lb (13-18 kg).  Was 18-24.9 (normal), you should gain 25-35 lb (11-16 kg).  Was 25-29.9 (overweight), you should gain 15-25 lb (7-11 kg).  Was 30 or greater (obese), you should gain 11-20 lb (5-9 kg). What if I am having  twins or multiples? Generally, if you are carrying twins or multiples:  You may need to eat 300-600 extra calories a day.  The recommended range for total weight gain is 25-54 lb (11-25 kg), depending on your BMI before pregnancy.  Talk with your health care provider to find out about nutritional needs, weight gain, and exercise that is right for you. What foods can I eat?  Grains All grains. Choose whole grains, such as whole-wheat bread, oatmeal, or brown rice. Vegetables All vegetables. Eat a variety of colors and types of vegetables. Remember to wash your vegetables well before peeling or eating. Fruits All fruits. Eat a variety of colors and types of fruit. Remember to wash your fruits well before peeling or eating. Meats and other protein foods Lean meats, including chicken, [place], fish, and lean cuts of beef, veal, or pork. If you eat fish or seafood, choose options that are higher in omega-3 fatty acids and lower in mercury, such as salmon, herring, mussels, trout, sardines, pollock, shrimp, crab, and lobster. Tofu. Tem[place]. Beans. Eggs. Peanut butter and other nut butters. Make sure that all meats, poultry, and eggs are cooked to food-safe temperatures or "well-done." Two or more servings of fish are recommended each week in order to get the most benefits from omega-3 fatty acids that are found in seafood. Choose fish that are lower in mercury. You can find more information online:  [employer] Dairy Pasteurized milk and milk alternatives (such as almond  milk). Pasteurized yogurt and pasteurized cheese. Cottage cheese. Sour cream. Beverages Water. Juices that contain 100% fruit juice or vegetable juice. Caffeine-free teas and decaffeinated coffee. Drinks that contain caffeine are okay to drink, but it is better to avoid caffeine. Keep your total caffeine intake to less than 200 mg each day (which is 12 oz or 355 mL of coffee, tea, or soda) or the limit as told by your health care provider. Fats and oils Fats and oils are okay to include in moderation. Sweets and desserts Sweets and desserts are okay to include in moderation. Seasoning and other foods All pasteurized condiments. The items listed above may not be a complete list of recommended foods and beverages. Contact your dietitian for more options. What foods are not recommended? Vegetables Raw (unpasteurized) vegetable juices. Fruits Unpasteurized fruit juices. Meats and other protein foods Lunch meats, bologna, hot dogs, or other deli meats. (If you must eat those meats, reheat them until they are steaming hot.) Refrigerated pat, meat spreads from a meat counter, smoked seafood that is found in the refrigerated section of a store. Raw or undercooked meats, poultry, and eggs. Raw fish, such as sushi or sashimi. Fish that have high mercury content, such as tilefish, shark, swordfish, and king mackerel. To learn more about mercury in fish, talk with your health care provider or look for online resources, such as:  GuamGaming.ch Dairy Raw (unpasteurized) milk and any foods that have raw milk in them. Soft cheeses, such as feta, queso blanco, queso fresco, Brie, Camembert cheeses, blue-veined cheeses, and Panela cheese (unless it is made with pasteurized milk, which must be stated on the label). Beverages Alcohol. Sugar-sweetened beverages, such as sodas, teas, or energy drinks. Seasoning and other foods Homemade fermented foods and drinks, such as pickles, sauerkraut, or kombucha drinks.  (Store-bought pasteurized versions of these are okay.) Salads that are made in a store or deli, such as ham salad, chicken salad, egg salad, tuna salad, and seafood salad. The items listed above may not be a  complete list of foods and beverages to avoid. Contact your dietitian for more information. Where to find more information To calculate the number of calories you need based on your height, weight, and activity level, you can use an online calculator such as:  MobileTransition.ch To calculate how much weight you should gain during pregnancy, you can use an online pregnancy weight gain calculator such as:  StreamingFood.com.cy Summary  While you are pregnant, your body requires additional nutrition to help support your growing baby.  Eat a variety of foods, especially fruits and vegetables to get a full range of vitamins and minerals.  Practice good food safety and cleanliness. Wash your hands before you eat and after you prepare raw meat. Wash all fruits and vegetables well before peeling or eating. Taking these actions can help to prevent food-borne illnesses, such as listeriosis, that can be very dangerous to your baby.  Do not eat raw meat or fish. Do not eat fish that have high mercury content, such as tilefish, shark, swordfish, and king mackerel. Do not eat unpasteurized (raw) dairy.  Take a prenatal vitamin to help meet your additional vitamin and mineral needs during pregnancy, specifically for folic acid, iron, calcium, and vitamin D. This information is not intended to replace advice given to you by your health care provider. Make sure you discuss any questions you have with your health care provider. Document Released: 12/08/2013 Document Revised: 11/20/2016 Document Reviewed: 11/20/2016 Elsevier Interactive Patient Education  2019 [employer]. Prenatal Care Prenatal care is health care during pregnancy. It helps you and your  unborn baby (fetus) stay as healthy as possible. Prenatal care may be provided by a midwife, a family practice health care provider, or a childbirth and pregnancy specialist (obstetrician). How does this affect me? During pregnancy, you will be closely monitored for any new conditions that might develop. To lower your risk of pregnancy complications, you and your health care provider will talk about any underlying conditions you have. How does this affect my baby? Early and consistent prenatal care increases the chance that your baby will be healthy during pregnancy. Prenatal care lowers the risk that your baby will be:  Born early (prematurely).  Smaller than expected at birth (small for gestational age). What can I expect at the first prenatal care visit? Your first prenatal care visit will likely be the longest. You should schedule your first prenatal care visit as soon as you know that you are pregnant. Your first visit is a good time to talk about any questions or concerns you have about pregnancy. At your visit, you and your health care provider will talk about:  Your medical history, including: ? Any past pregnancies. ? Your family's medical history. ? The baby's father's medical history. ? Any long-term (chronic) health conditions you have and how you manage them. ? Any surgeries or procedures you have had. ? Any current over-the-counter or prescription medicines, herbs, or supplements you are taking.  Other factors that could pose a risk to your baby, including:  Your home setting and your stress levels, including: ? Exposure to abuse or violence. ? Household financial strain. ? Mental health conditions you have.  Your daily health habits, including diet and exercise. Your health care provider will also:  Measure your weight, height, and blood pressure.  Do a physical exam, including a pelvic and breast exam.  Perform blood tests and urine tests to check for: ? Urinary  tract infection. ? Sexually transmitted infections (STIs). ? Low iron  levels in your blood (anemia). ? Blood type and certain proteins on red blood cells (Rh antibodies). ? Infections and immunity to viruses, such as hepatitis B and rubella. ? HIV (human immunodeficiency virus).  Do an ultrasound to confirm your baby's growth and development and to help predict your estimated due date (EDD). This ultrasound is done with a probe that is inserted into the vagina (transvaginal ultrasound).  Discuss your options for genetic screening.  Give you information about how to keep yourself and your baby healthy, including: ? Nutrition and taking vitamins. ? Physical activity. ? How to manage pregnancy symptoms such as nausea and vomiting (morning sickness). ? Infections and substances that may be harmful to your baby and how to avoid them. ? Food safety. ? Dental care. ? Working. ? Travel. ? Warning signs to watch for and when to call your health care provider. How often will I have prenatal care visits? After your first prenatal care visit, you will have regular visits throughout your pregnancy. The visit schedule is often as follows:  Up to week 28 of pregnancy: once every 4 weeks.  28-36 weeks: once every 2 weeks.  After 36 weeks: every week until delivery. Some women may have visits more or less often depending on any underlying health conditions and the health of the baby. Keep all follow-up and prenatal care visits as told by your health care provider. This is important. What happens during routine prenatal care visits? Your health care provider will:  Measure your weight and blood pressure.  Check for fetal heart sounds.  Measure the height of your uterus in your abdomen (fundal height). This may be measured starting around week 20 of pregnancy.  Check the position of your baby inside your uterus.  Ask questions about your diet, sleeping patterns, and whether you can feel the  baby move.  Review warning signs to watch for and signs of labor.  Ask about any pregnancy symptoms you are having and how you are dealing with them. Symptoms may include: ? Headaches. ? Nausea and vomiting. ? Vaginal discharge. ? Swelling. ? Fatigue. ? Constipation. ? Any discomfort, including back or pelvic pain. Make a list of questions to ask your health care provider at your routine visits. What tests might I have during prenatal care visits? You may have blood, urine, and imaging tests throughout your pregnancy, such as:  Urine tests to check for glucose, protein, or signs of infection.  Glucose tests to check for a form of diabetes that can develop during pregnancy (gestational diabetes mellitus). This is usually done around week 24 of pregnancy.  An ultrasound to check your baby's growth and development and to check for birth defects. This is usually done around week 20 of pregnancy.  A test to check for group B strep (GBS) infection. This is usually done around week 36 of pregnancy.  Genetic testing. This may include blood or imaging tests, such as an ultrasound. Some genetic tests are done during the first trimester and some are done during the second trimester. What else can I expect during prenatal care visits? Your health care provider may recommend getting certain vaccines during pregnancy. These may include:  A yearly flu shot (annual influenza vaccine). This is especially important if you will be pregnant during flu season.  Tdap (tetanus, diphtheria, pertussis) vaccine. Getting this vaccine during pregnancy can protect your baby from whooping cough (pertussis) after birth. This vaccine may be recommended between weeks 27 and 36 of pregnancy. Later  in your pregnancy, your health care provider may give you information about:  Childbirth and breastfeeding classes.  Choosing a health care provider for your baby.  Umbilical cord banking.  Breastfeeding.  Birth  control after your baby is born.  The hospital labor and delivery unit and how to tour it.  Registering at the hospital before you go into labor. Where to find more information  Office on Women's Health: LegalWarrants.gl  American Pregnancy Association: americanpregnancy.org  March of Dimes: marchofdimes.org Summary  Prenatal care helps you and your baby stay as healthy as possible during pregnancy.  Your first prenatal care visit will most likely be the longest.  You will have visits and tests throughout your pregnancy to monitor your health and your baby's health.  Bring a list of questions to your visits to ask your health care provider.  Make sure to keep all follow-up and prenatal care visits with your health care provider. This information is not intended to replace advice given to you by your health care provider. Make sure you discuss any questions you have with your health care provider. Document Released: 02/26/2003 Document Revised: 02/22/2017 Document Reviewed: 02/22/2017 Elsevier Interactive Patient Education  2019 [place] When a woman feels excessive tension or worry (anxiety) during pregnancy or during the first 12 months after she gives birth, she has a condition called perinatal anxiety. Anxiety can interfere with work, school, relationships, and other everyday activities. If it is not managed properly, it can also cause problems in the mother and her baby.  If you are pregnant and you have symptoms of an anxiety disorder, it is important to talk with your health care provider. What are the causes? The exact cause of this condition is not known. Hormonal changes during and after pregnancy may play a role in causing perinatal anxiety. What increases the risk? You are more likely to develop this condition if:  You have a personal or family history of depression, anxiety, or mood disorders.  You experience a stressful life event during  pregnancy, such as the death of a loved one.  You have a lot of regular life stress, such as being a single parent.  You have thyroid problems. What are the signs or symptoms? Perinatal anxiety can be different for everyone. It may include:  Panic attacks (panic disorder). These are intense episodes of fear or discomfort that may also cause sweating, nausea, shortness of breath, or fear of dying. They usually last 5-15 minutes.  Reliving an upsetting (traumatic) event through distressing thoughts, dreams, or flashbacks (post-traumatic stress disorder, or PTSD).  Excessive worry about multiple problems (generalized anxiety disorder).  Fear and stress about leaving certain people or loved ones (separation anxiety).  Performing repetitive tasks (compulsions) to relieve stress or worry (obsessive compulsive disorder, or OCD).  Fear of certain objects or situations (phobias).  Excessive worrying, such as a constant feeling that something bad is going to happen.  Inability to relax.  Difficulty concentrating.  Sleep problems.  Frequent nightmares or disturbing thoughts. How is this diagnosed? This condition is diagnosed based on a physical exam and mental evaluation. In some cases, your health care provider may use an anxiety screening tool. These tools include a list of questions that can help a health care provider diagnose anxiety. Your health care provider may refer you to a mental health expert who specializes in anxiety. How is this treated? This condition may be treated with:  Medicines. Your health care provider will only  give you medicines that have been proven safe for pregnancy and breastfeeding.  Talk therapy with a mental health professional to help change your patterns of thinking (cognitive behavioral therapy).  Mindfulness-based stress reduction.  Other relaxation therapies, such as deep breathing or guided muscle relaxation.  Support groups. Follow these  instructions at home: Lifestyle  Do not use any products that contain nicotine or tobacco, such as cigarettes and e-cigarettes. If you need help quitting, ask your health care provider.  Do not use alcohol when you are pregnant. After your baby is born, limit alcohol intake to no more than 1 drink a day. One drink equals 12 oz of beer, 5 oz of wine, or 1 oz of hard liquor.  Consider joining a support group for new mothers. Ask your health care provider for recommendations.  Take good care of yourself. Make sure you: ? Get plenty of sleep. If you are having trouble sleeping, talk with your health care provider. ? Eat a healthy diet. This includes plenty of fruits and vegetables, whole grains, and lean proteins. ? Exercise regularly, as told by your health care provider. Ask your health care provider what exercises are safe for you. General instructions  Take over-the-counter and prescription medicines only as told by your health care provider.  Talk with your partner or family members about your feelings during pregnancy. Share any concerns or fears that you may have.  Ask for help with tasks or chores when you need it. Ask friends and family members to provide meals, watch your children, or help with cleaning.  Keep all follow-up visits as told by your health care provider. This is important. Contact a health care provider if:  You (or people close to you) notice that you have any symptoms of anxiety or depression.  You have anxiety and your symptoms get worse.  You experience side effects from medicines, such as nausea or sleep problems. Get help right away if:  You feel like hurting yourself, your baby, or someone else. If you ever feel like you may hurt yourself or others, or have thoughts about taking your own life, get help right away. You can go to your nearest emergency department or call:  Your local emergency services (911 in the [place]  A suicide crisis helpline, such  as the [place] at (709)676-3929. This is open 24 hours a day. Summary  Perinatal anxiety is when a woman feels excessive tension or worry during pregnancy or during the first 12 months after she gives birth.  Perinatal anxiety may include panic attacks, post-traumatic stress disorder, separation anxiety, phobias, or generalized anxiety.  Perinatal anxiety can cause physical health problems in the mother and baby if not properly managed.  This condition is treated with medicines, talk therapy, stress reduction therapies, or a combination of two or more treatments.  Talk with your partner or family members about your concerns or fears. Do not be afraid to ask for help. This information is not intended to replace advice given to you by your health care provider. Make sure you discuss any questions you have with your health care provider. Document Released: 04/22/2016 Document Revised: 04/22/2016 Document Reviewed: 04/22/2016 Elsevier Interactive Patient Education  [employer].

## 2018-03-23 NOTE — Progress Notes (Signed)
NOB today. No vb. No lof. Went to hospital for right side pain, ovarian cyst

## 2018-03-25 LAB — URINE CULTURE

## 2018-03-29 LAB — CYTOLOGY - PAP
Chlamydia: NEGATIVE
Diagnosis: NEGATIVE
HPV (WINDOPATH): NOT DETECTED
NEISSERIA GONORRHEA: NEGATIVE
TRICH (WINDOWPATH): NEGATIVE

## 2018-03-30 ENCOUNTER — Ambulatory Visit (INDEPENDENT_AMBULATORY_CARE_PROVIDER_SITE_OTHER): Payer: Medicaid Other | Admitting: Obstetrics & Gynecology

## 2018-03-30 ENCOUNTER — Ambulatory Visit (INDEPENDENT_AMBULATORY_CARE_PROVIDER_SITE_OTHER): Payer: Medicaid Other

## 2018-03-30 VITALS — BP 110/70 | Wt 178.0 lb

## 2018-03-30 DIAGNOSIS — O09299 Supervision of pregnancy with other poor reproductive or obstetric history, unspecified trimester: Secondary | ICD-10-CM

## 2018-03-30 DIAGNOSIS — Z3A08 8 weeks gestation of pregnancy: Secondary | ICD-10-CM

## 2018-03-30 DIAGNOSIS — N83201 Unspecified ovarian cyst, right side: Secondary | ICD-10-CM | POA: Diagnosis not present

## 2018-03-30 DIAGNOSIS — O3481 Maternal care for other abnormalities of pelvic organs, first trimester: Secondary | ICD-10-CM | POA: Diagnosis not present

## 2018-03-30 DIAGNOSIS — O208 Other hemorrhage in early pregnancy: Secondary | ICD-10-CM

## 2018-03-30 DIAGNOSIS — O0990 Supervision of high risk pregnancy, unspecified, unspecified trimester: Secondary | ICD-10-CM

## 2018-03-30 DIAGNOSIS — O09291 Supervision of pregnancy with other poor reproductive or obstetric history, first trimester: Secondary | ICD-10-CM

## 2018-03-30 MED ORDER — CITRANATAL HARMONY 27-1-260 MG PO CAPS: 1.0000 | ORAL_CAPSULE | Freq: Every day | ORAL | 11 refills | Status: DC

## 2018-03-30 NOTE — Progress Notes (Signed)
  Subjective  Min nausea No bleeding Occas pain, more in back  Objective  BP 110/70   Wt 178 lb (80.7 kg)   LMP 01/26/2018 (Exact Date)   BMI 32.56 kg/m  General: NAD Pumonary: no increased work of breathing Abdomen: gravid, non-tender Extremities: no edema Psychiatric: mood appropriate, affect full  Assessment  33 y.o. G3P1011 at [redacted]w[redacted]d by  11/09/2018, by Ultrasound presenting for routine prenatal visit  Plan   Problem List Items Addressed This Visit      Other   Supervision of high risk pregnancy, antepartum - Primary    Other Visit Diagnoses    [redacted] weeks gestation of pregnancy       History of pre-eclampsia in prior pregnancy, currently pregnant       Relevant Orders   Protein / creatinine ratio, urine    Review of ULTRASOUND.    I have personally reviewed images and report of recent ultrasound done at [facility].    Plan of management to be discussed with patient. [facility] discussed [facility] 11/09/2018 Plan labs for PNL, preeclampsia baseline, cfDNA at 10 weeks visit    Urine prot/Cr done today Cont Lexapro 1/2 tab daily Desires TOLAC, counseled about pros and cons of both CS and VBAC today, will provide more info and counseling at a later visit Cont Labetalol BID for Nancee Liter, MD, [place] 03/30/2018  3:15 PM

## 2018-03-30 NOTE — Patient Instructions (Signed)

## 2018-03-31 LAB — PROTEIN / CREATININE RATIO, URINE
Creatinine, Urine: 186.5 mg/dL
Protein, Ur: 11.3 mg/dL
Protein/Creat Ratio: 61 mg/g creat (ref 0–200)

## 2018-04-12 ENCOUNTER — Ambulatory Visit (INDEPENDENT_AMBULATORY_CARE_PROVIDER_SITE_OTHER): Payer: Medicaid Other | Admitting: Obstetrics & Gynecology

## 2018-04-12 ENCOUNTER — Encounter: Payer: Self-pay | Admitting: Obstetrics & Gynecology

## 2018-04-12 VITALS — BP 100/60 | Wt 175.0 lb

## 2018-04-12 DIAGNOSIS — Z131 Encounter for screening for diabetes mellitus: Secondary | ICD-10-CM

## 2018-04-12 DIAGNOSIS — Z3A09 9 weeks gestation of pregnancy: Secondary | ICD-10-CM

## 2018-04-12 DIAGNOSIS — O0990 Supervision of high risk pregnancy, unspecified, unspecified trimester: Secondary | ICD-10-CM

## 2018-04-12 DIAGNOSIS — O09291 Supervision of pregnancy with other poor reproductive or obstetric history, first trimester: Secondary | ICD-10-CM

## 2018-04-12 DIAGNOSIS — O09299 Supervision of pregnancy with other poor reproductive or obstetric history, unspecified trimester: Secondary | ICD-10-CM

## 2018-04-12 DIAGNOSIS — Z1379 Encounter for other screening for genetic and chromosomal anomalies: Secondary | ICD-10-CM

## 2018-04-12 NOTE — Progress Notes (Signed)
  Subjective  Left sided pain at times. Min nausea. No bleeding No headache, blurry vision, CP, SOB  Objective  BP 100/60   Wt 175 lb (79.4 kg)   LMP 01/26/2018 (Exact Date)   BMI 32.01 kg/m  General: NAD Pumonary: no increased work of breathing Abdomen: gravid, non-tender Extremities: no edema Psychiatric: mood appropriate, affect full  Assessment  33 y.o. G3P1011 at [redacted]w[redacted]d by  11/09/2018, by Ultrasound presenting for routine prenatal visit  Plan   Problem List Items Addressed This Visit      Other   Supervision of high risk pregnancy, antepartum - Primary   Relevant Orders   RPR+Rh+ABO+Rub Ab+Ab Scr+CB...   Hemoglobinopathy evaluation   Glucose, 1 hour gestational    Other Visit Diagnoses    History of pre-eclampsia in prior pregnancy, currently pregnant       Relevant Orders   RPR+Rh+ABO+Rub Ab+Ab Scr+CB...   CMP and Liver   Encounter for genetic screening for Down Syndrome       Relevant Orders   MaterniT21 PLUS Core+SCA   Screening for diabetes mellitus       Relevant Orders   Glucose, 1 hour gestational    Labs needed. Early Glc testing recommended ASA 81 mg  Barnett Applebaum, MD, [facility] 04/12/2018  11:13 AM

## 2018-04-17 LAB — MATERNIT21 PLUS CORE+SCA
CHROMOSOME 13: NEGATIVE
Chromosome 18: NEGATIVE
Chromosome 21: NEGATIVE
Y CHROMOSOME: DETECTED

## 2018-04-17 LAB — RPR+RH+ABO+RUB AB+AB SCR+CB...
Antibody Screen: NEGATIVE
HEMATOCRIT: 35.8 % (ref 34.0–46.6)
HEMOGLOBIN: 11.9 g/dL (ref 11.1–15.9)
HIV Screen 4th Generation wRfx: NONREACTIVE
Hepatitis B Surface Ag: NEGATIVE
MCH: 30.8 pg (ref 26.6–33.0)
MCHC: 33.2 g/dL (ref 31.5–35.7)
MCV: 93 fL (ref 79–97)
Platelets: 277 10*3/uL (ref 150–450)
RBC: 3.86 x10E6/uL (ref 3.77–5.28)
RDW: 13.1 % (ref 11.7–15.4)
RH TYPE: POSITIVE
RPR Ser Ql: NONREACTIVE
Rubella Antibodies, IGG: 12.4 index (ref >0.99)
VARICELLA: 906 {index} (ref >165)
WBC: 12.2 10*3/uL — AB (ref 3.4–10.8)

## 2018-04-17 LAB — CMP AND LIVER
ALK PHOS: 36 IU/L — AB (ref 39–117)
ALT: 13 IU/L (ref 0–32)
AST: 17 IU/L (ref 0–40)
Albumin: 3.9 g/dL (ref 3.8–4.8)
BILIRUBIN, DIRECT: 0.07 mg/dL (ref 0.00–0.40)
BUN: 10 mg/dL (ref 6–20)
Bilirubin Total: 0.2 mg/dL (ref 0.0–1.2)
CALCIUM: 9.4 mg/dL (ref 8.7–10.2)
CHLORIDE: 105 mmol/L (ref 96–106)
CO2: 18 mmol/L — ABNORMAL LOW (ref 20–29)
Creatinine, Ser: 0.57 mg/dL (ref 0.57–1.00)
GFR calc non Af Amer: 123 mL/min/{1.73_m2} (ref >59)
GFR, EST AFRICAN AMERICAN: 142 mL/min/{1.73_m2} (ref >59)
Glucose: 106 mg/dL — ABNORMAL HIGH (ref 65–99)
Potassium: 3.6 mmol/L (ref 3.5–5.2)
Sodium: 140 mmol/L (ref 134–144)
Total Protein: 6.1 g/dL (ref 6.0–8.5)

## 2018-04-17 LAB — HEMOGLOBINOPATHY EVALUATION
HGB C: 0 %
HGB S: 0 %
HGB VARIANT: 0 %
Hemoglobin A2 Quantitation: 2.4 % (ref 1.8–3.2)
Hemoglobin F Quantitation: 0 % (ref 0.0–2.0)
Hgb A: 97.6 % (ref 96.4–98.8)

## 2018-05-10 ENCOUNTER — Other Ambulatory Visit: Payer: Medicaid Other

## 2018-05-10 ENCOUNTER — Encounter: Payer: Self-pay | Admitting: Maternal Newborn

## 2018-05-10 ENCOUNTER — Ambulatory Visit (INDEPENDENT_AMBULATORY_CARE_PROVIDER_SITE_OTHER): Payer: Medicaid Other | Admitting: Maternal Newborn

## 2018-05-10 ENCOUNTER — Other Ambulatory Visit: Payer: Self-pay | Admitting: Obstetrics & Gynecology

## 2018-05-10 VITALS — BP 130/70 | Wt 172.0 lb

## 2018-05-10 DIAGNOSIS — O09299 Supervision of pregnancy with other poor reproductive or obstetric history, unspecified trimester: Secondary | ICD-10-CM | POA: Insufficient documentation

## 2018-05-10 DIAGNOSIS — Z3A13 13 weeks gestation of pregnancy: Secondary | ICD-10-CM

## 2018-05-10 DIAGNOSIS — O09291 Supervision of pregnancy with other poor reproductive or obstetric history, first trimester: Secondary | ICD-10-CM

## 2018-05-10 DIAGNOSIS — O99330 Smoking (tobacco) complicating pregnancy, unspecified trimester: Secondary | ICD-10-CM

## 2018-05-10 DIAGNOSIS — O0990 Supervision of high risk pregnancy, unspecified, unspecified trimester: Secondary | ICD-10-CM

## 2018-05-10 DIAGNOSIS — O99331 Smoking (tobacco) complicating pregnancy, first trimester: Secondary | ICD-10-CM

## 2018-05-10 DIAGNOSIS — Z3689 Encounter for other specified antenatal screening: Secondary | ICD-10-CM

## 2018-05-10 MED ORDER — NICOTINE 21 MG/24HR TD PT24: 21.0000 mg | MEDICATED_PATCH | Freq: Every day | TRANSDERMAL | 0 refills | Status: DC

## 2018-05-10 MED ORDER — ASPIRIN EC 81 MG PO TBEC: 81.0000 mg | DELAYED_RELEASE_TABLET | Freq: Every day | ORAL | 2 refills | Status: DC

## 2018-05-10 NOTE — Progress Notes (Signed)
ROB/GTT C/o LLQ pain, sometimes she cannot feel the tips of her fingers and she would like to stop smoking and needs help with that

## 2018-05-10 NOTE — Patient Instructions (Signed)

## 2018-05-10 NOTE — Progress Notes (Signed)
    Routine Prenatal Care Visit  Subjective  Carol Small is a 33 y.o. G3P1011 at [redacted]w[redacted]d being seen today for ongoing prenatal care.  She is currently monitored for the following issues for this high-risk pregnancy and has Hypertension; Major depressive disorder; Supervision of high risk pregnancy, antepartum; and Obesity affecting pregnancy, antepartum on their problem list.  ----------------------------------------------------------------------------------- Patient reports having some headaches, some relief with Tylenol.    Vag. Bleeding: None.  Movement: Absent. No leaking of fluid.  ----------------------------------------------------------------------------------- The following portions of the patient's history were reviewed and updated as appropriate: allergies, current medications, past family history, past medical history, past social history, past surgical history and problem list. Problem list updated.  Objective  Blood pressure 130/70, weight 172 lb (78 kg), last menstrual period 01/26/2018. Pregravid weight 171 lb (77.6 kg) Total Weight Gain 1 lb (0.454 kg) Body mass index is 31.46 kg/m.  Fetal Status: Fetal Heart Rate (bpm): 145   Movement: Absent     General:  Alert, oriented and cooperative. Patient is in no acute distress.  Skin: Skin is warm and dry. No rash noted.   Cardiovascular: Normal heart rate noted  Respiratory: Normal respiratory effort, no problems with respiration noted  Abdomen: Soft, gravid, appropriate for gestational age. Pain/Pressure: Present     Pelvic:  Cervical exam deferred        Extremities: Normal range of motion.  Edema: None  Mental Status: Normal mood and affect. Normal behavior. Normal judgment and thought content.    Assessment   33 y.o. G3P1011 at [redacted]w[redacted]d, EDD 11/09/2018 by Ultrasound presenting for a routine prenatal visit.  Plan   pregnancy Problems (from 03/23/18 to present)    No problems associated with this episode.      Discussed 1000 mg Tylenol every 6 hours PRN to help with headaches, may add caffienated drink as needed.  She would like to quit smoking, uncertain how many cigarettes she is smoking daily. Rx for nicotine patch.   Glucose screen today. ASA ordered and she is aware to take daily.  Please refer to After Visit Summary for other counseling recommendations.   Return in about 4 weeks (around 06/07/2018) for HROB/anatomy scan.  Avel Sensor, CNM 05/10/2018

## 2018-05-11 LAB — GLUCOSE, 1 HOUR GESTATIONAL: Gestational Diabetes Screen: 99 mg/dL (ref 65–139)

## 2018-05-25 ENCOUNTER — Telehealth: Payer: Self-pay

## 2018-05-25 NOTE — Telephone Encounter (Signed)
Pt c/o can't sleep d/t fingertips/hands going numb.  [phone]  Pt states they go numb c holding phone or brushing hair.  Adv wrist splints at least at HS.  May wear all day if necessary.  If doesn't help to d/w provider at next visit.  Pt also asked about glucose results.  Adv normal.

## 2018-06-09 ENCOUNTER — Other Ambulatory Visit: Payer: Self-pay

## 2018-06-09 ENCOUNTER — Ambulatory Visit (INDEPENDENT_AMBULATORY_CARE_PROVIDER_SITE_OTHER): Payer: Medicaid Other | Admitting: Obstetrics and Gynecology

## 2018-06-09 ENCOUNTER — Encounter: Payer: Self-pay | Admitting: Obstetrics and Gynecology

## 2018-06-09 ENCOUNTER — Ambulatory Visit (INDEPENDENT_AMBULATORY_CARE_PROVIDER_SITE_OTHER): Payer: Medicaid Other

## 2018-06-09 VITALS — BP 126/74 | Wt 174.0 lb

## 2018-06-09 DIAGNOSIS — O99330 Smoking (tobacco) complicating pregnancy, unspecified trimester: Secondary | ICD-10-CM

## 2018-06-09 DIAGNOSIS — Z3A18 18 weeks gestation of pregnancy: Secondary | ICD-10-CM

## 2018-06-09 DIAGNOSIS — Z363 Encounter for antenatal screening for malformations: Secondary | ICD-10-CM

## 2018-06-09 DIAGNOSIS — O99212 Obesity complicating pregnancy, second trimester: Secondary | ICD-10-CM

## 2018-06-09 DIAGNOSIS — O99332 Smoking (tobacco) complicating pregnancy, second trimester: Secondary | ICD-10-CM

## 2018-06-09 DIAGNOSIS — O10919 Unspecified pre-existing hypertension complicating pregnancy, unspecified trimester: Secondary | ICD-10-CM | POA: Insufficient documentation

## 2018-06-09 DIAGNOSIS — O0990 Supervision of high risk pregnancy, unspecified, unspecified trimester: Secondary | ICD-10-CM

## 2018-06-09 DIAGNOSIS — O09299 Supervision of pregnancy with other poor reproductive or obstetric history, unspecified trimester: Secondary | ICD-10-CM

## 2018-06-09 DIAGNOSIS — Z3689 Encounter for other specified antenatal screening: Secondary | ICD-10-CM

## 2018-06-09 DIAGNOSIS — O10912 Unspecified pre-existing hypertension complicating pregnancy, second trimester: Secondary | ICD-10-CM

## 2018-06-09 DIAGNOSIS — O99210 Obesity complicating pregnancy, unspecified trimester: Secondary | ICD-10-CM

## 2018-06-09 DIAGNOSIS — O09292 Supervision of pregnancy with other poor reproductive or obstetric history, second trimester: Secondary | ICD-10-CM

## 2018-06-09 NOTE — Progress Notes (Signed)
Routine Prenatal Care Visit  Subjective  Carol Small is a 33 y.o. G3P1011 at [redacted]w[redacted]d being seen today for ongoing prenatal care.  She is currently monitored for the following issues for this high-risk pregnancy and has Hypertension; Major depressive disorder; Supervision of high risk pregnancy, antepartum; Obesity affecting pregnancy, antepartum; Tobacco use affecting pregnancy, antepartum; History of pre-eclampsia in prior pregnancy, currently pregnant; and Chronic hypertension affecting pregnancy on their problem list.  ----------------------------------------------------------------------------------- Patient reports Symptoms of right and left palmar numbness at times. Denies loss of sensation and weakness. She has tried a brace for her right hand and some cream she got at the pharmacy. But, this does not seem to work well.    . Vag. Bleeding: None.  Movement: Present. Denies leaking of fluid.  U/S is complete for anatomy. Some views suboptimal, but sufficient.  ----------------------------------------------------------------------------------- The following portions of the patient's history were reviewed and updated as appropriate: allergies, current medications, past family history, past medical history, past social history, past surgical history and problem list. Problem list updated.   Objective  Blood pressure 126/74, weight 174 lb (78.9 kg), last menstrual period 01/26/2018. Pregravid weight 171 lb (77.6 kg) Total Weight Gain 3 lb (1.361 kg) Urinalysis: Urine Protein    Urine Glucose    Fetal Status: Fetal Heart Rate (bpm): 147   Movement: Present     General:  Alert, oriented and cooperative. Patient is in no acute distress.  Skin: Skin is warm and dry. No rash noted.   Cardiovascular: Normal heart rate noted  Respiratory: Normal respiratory effort, no problems with respiration noted  Abdomen: Soft, gravid, appropriate for gestational age. Pain/Pressure: Absent     Pelvic:   Cervical exam deferred        Extremities: Normal range of motion.     Mental Status: Normal mood and affect. Normal behavior. Normal judgment and thought content.   Imaging Results US Ob Comp + 14 Wk  Result Date: 06/09/2018 ULTRASOUND REPORT Location: [facility] Date of Service: 06/09/2018 Patient Name: [name] DOB: [birth year] MRN: [id number] Indications:Anatomy Ultrasound Findings: Nelda [place]se[place] intrauterine pregnancy is visualized with FHR at 147 BPM. Biometrics give an (U/S) Gestational age of [redacted]w[redacted]d and an (U/S) EDD of 11/06/2018; this correlates with the clinically established Estimated Date of Delivery: 11/09/18 Fetal presentation is Cephalic. EFW: 245 gram (9 oz). Placenta: posterior. Grade: 0 AFI: subjectively normal. Anatomic survey is complete and normal; Gender - female.  Right Ovary is normal in appearance. Left Ovary is normal appearance. Survey of the adnexa demonstrates no adnexal masses. There is no free peritoneal fluid in the cul de sac. Impression: 1. [redacted]w[redacted]d Viable Singleton Intrauterine pregnancy by U/S. 2. (U/S) EDD is consistent with Clinically established Estimated Date of Delivery: 11/09/18 . 3. Normal Anatomy Scan with suboptimal views of the spine, but sufficient. No concerns on exam today. Mital bahen P Patel, RDMS There is a singleton gestation with subjectively normal amniotic fluid volume. The fetal biometry correlates with established dating. Detailed evaluation of the fetal anatomy was performed.The fetal anatomical survey appears within normal limits within the resolution of ultrasound as described above.  It must be noted that a normal ultrasound is unable to rule out fetal aneuploidy nor is it able to detect all possible malformations.   The ultrasound images and findings were reviewed by me and I agree with the above report. Prentice Docker, MD, [facility] 06/09/2018 11:54 AM       Assessment  33 y.o. G3P1011 at [redacted]w[redacted]d by  11/09/2018,  by Ultrasound presenting for routine prenatal visit  Plan   pregnancy Problems (from 03/23/18 to present)    Problem Noted Resolved   Chronic hypertension affecting pregnancy 06/09/2018 by Will Bonnet, MD No   Overview Signed 06/09/2018 11:56 AM by Will Bonnet, MD    [ ]  Aspirin 81 mg daily after 12 weeks; discontinue after 36 weeks [ ]  baseline labs with CBC, CMP, urine protein/creatinine ratio [ ]  no BP meds unless BPs become elevated [ ]  ultrasound for growth at 28, 32, 36 weeks [ ]  Aspirin 81 mg daily after 12 weeks; discontinue after 36 weeks [ ]  Baseline EKG   Current antihypertensives:  Labetalol 100 mg bid  Baseline and surveillance labs (pulled in from [facility], refresh links as needed)  Lab Results  Component Value Date   PLT 277 04/12/2018   CREATININE 0.57 04/12/2018   AST 17 04/12/2018   ALT 13 04/12/2018    Antenatal Testing CHTN - O10.919  Group I  BP < 140/90, no preeclampsia, AGA,  nml AFV, +/- meds    Group Small BP > 140/90, on meds, no preeclampsia, AGA, nml AFV  20-28-34-38  20-24-28-32-35-38  32//2 x wk  28//BPP wkly then 32//2 x wk  40 no meds; 39 meds  PRN or 37  Pre-eclampsia  GHTN - O13.9/Preeclampsia without severe features  - O14.00   Preeclampsia with severe features - O14.10  Q 3-4wks  Q 2 wks  28//BPP wkly then 32//2 x wk  Inpatient  37  PRN or 34             Preterm labor symptoms and general obstetric precautions including but not limited to vaginal bleeding, contractions, leaking of fluid and fetal movement were reviewed in detail with the patient. Please refer to After Visit Summary for other counseling recommendations.   Return in about 4 weeks (around 07/07/2018) for Routine Prenatal Appointment/Telephone.  Prentice Docker, MD, [facility], [facility] 06/09/2018 12:03 PM

## 2018-07-07 ENCOUNTER — Encounter: Payer: Self-pay | Admitting: Maternal Newborn

## 2018-07-07 ENCOUNTER — Ambulatory Visit (INDEPENDENT_AMBULATORY_CARE_PROVIDER_SITE_OTHER): Payer: Medicaid Other | Admitting: Maternal Newborn

## 2018-07-07 ENCOUNTER — Other Ambulatory Visit: Payer: Self-pay

## 2018-07-07 DIAGNOSIS — Z3A22 22 weeks gestation of pregnancy: Secondary | ICD-10-CM

## 2018-07-07 DIAGNOSIS — R12 Heartburn: Secondary | ICD-10-CM

## 2018-07-07 DIAGNOSIS — O10912 Unspecified pre-existing hypertension complicating pregnancy, second trimester: Secondary | ICD-10-CM

## 2018-07-07 DIAGNOSIS — O26892 Other specified pregnancy related conditions, second trimester: Secondary | ICD-10-CM

## 2018-07-07 DIAGNOSIS — O0990 Supervision of high risk pregnancy, unspecified, unspecified trimester: Secondary | ICD-10-CM

## 2018-07-07 DIAGNOSIS — O26899 Other specified pregnancy related conditions, unspecified trimester: Secondary | ICD-10-CM

## 2018-07-07 MED ORDER — FAMOTIDINE 20 MG PO TABS: 20.0000 mg | ORAL_TABLET | Freq: Two times a day (BID) | ORAL | 3 refills | Status: AC

## 2018-07-07 NOTE — Progress Notes (Signed)
ROB telephone visit Hands are still going numb

## 2018-07-07 NOTE — Progress Notes (Addendum)
Virtual Visit via Telephone Note  I connected with Carol Small on 07/07/2018 at  9:30 AM EDT by telephone and verified that I am speaking with the correct person using two identifiers.  Location: Patient: Home  Provider: Office   I discussed the limitations, risks, security and privacy concerns of performing an evaluation and management service by telephone and the availability of in person appointments. The patient expressed understanding and agreed to proceed.  See note below for content of [place] telephone visit.  I discussed the assessment and treatment plan with the patient. The patient was provided an opportunity to ask questions and all were answered. The patient agreed with the plan and demonstrated an understanding of the instructions.   The patient was advised to call back or seek an in-person evaluation if the symptoms worsen or if the condition fails to improve as anticipated.  I provided 9 minutes of non-face-to-face time during this encounter.   Rexene Agent, CNM    Routine Prenatal Care Telephone Visit  Subjective  Carol Small is a 33 y.o. G3P1011 at [redacted]w[redacted]d being seen today for ongoing prenatal care.  She is currently monitored for the following issues for this high-risk pregnancy and has Hypertension; Major depressive disorder; Supervision of high risk pregnancy, antepartum; Obesity affecting pregnancy, antepartum; Tobacco use affecting pregnancy, antepartum; History of pre-eclampsia in prior pregnancy, currently pregnant; and Chronic hypertension affecting pregnancy on their problem list.  ----------------------------------------------------------------------------------- Patient reports heartburn and carpal tunnel symptons. Baby is moving well. No pelvic pain or contractions. No headaches, visual changes, or epigastric pain. She is able to check her blood pressure with her grandmother's machine and it has been normal. She is taking her medication for  hypertension. Vag. Bleeding: None.  Movement: Present. No leaking of fluid.  ----------------------------------------------------------------------------------- The following portions of the patient's history were reviewed and updated as appropriate: allergies, current medications, past family history, past medical history, past social history, past surgical history and problem list. Problem list updated.   Objective  Last menstrual period 01/26/2018. Pregravid weight 171 lb (77.6 kg) Total Weight Gain 3 lb (1.361 kg)  Assessment   33 y.o. G3P1011 at [redacted]w[redacted]d, EDD 11/09/2018, by Ultrasound presenting for a routine prenatal visit.  Plan   pregnancy Problems (from 03/23/18 to present)    Problem Noted Resolved   Chronic hypertension affecting pregnancy 06/09/2018 by Will Bonnet, MD No   Overview Signed 06/09/2018 11:56 AM by Will Bonnet, MD    [ ]  Aspirin 81 mg daily after 12 weeks; discontinue after 36 weeks [ ]  baseline labs with CBC, CMP, urine protein/creatinine ratio [ ]  no BP meds unless BPs become elevated [ ]  ultrasound for growth at 28, 32, 36 weeks [ ]  Aspirin 81 mg daily after 12 weeks; discontinue after 36 weeks [ ]  Baseline EKG   Current antihypertensives:  Labetalol 100 mg bid  Baseline and surveillance labs (pulled in from [facility], refresh links as needed)  Lab Results  Component Value Date   PLT 277 04/12/2018   CREATININE 0.57 04/12/2018   AST 17 04/12/2018   ALT 13 04/12/2018    Antenatal Testing CHTN - O10.919  Group I  BP < 140/90, no preeclampsia, AGA,  nml AFV, +/- meds    Group Small BP > 140/90, on meds, no preeclampsia, AGA, nml AFV  20-28-34-38  20-24-28-32-35-38  32//2 x wk  28//BPP wkly then 32//2 x wk  40 no meds; 39 meds  PRN or 37  Pre-eclampsia  GHTN - O13.9/Preeclampsia without severe features  - O14.00   Preeclampsia with severe features - O14.10  Q 3-4wks  Q 2 wks  28//BPP wkly then 32//2 x wk  Inpatient  37  PRN or 34           Reviewed comfort measures for carpal tunnel symptoms. Rx for Pepcid to treat heartburn.  Preterm labor symptoms and general obstetric precautions including but not limited to vaginal bleeding, contractions, leaking of fluid and fetal movement were reviewed.  Return in about 4 weeks (around 08/04/2018) for ROB/telephone visit.

## 2018-08-04 ENCOUNTER — Ambulatory Visit (INDEPENDENT_AMBULATORY_CARE_PROVIDER_SITE_OTHER): Payer: Medicaid Other | Admitting: Certified Nurse Midwife

## 2018-08-04 ENCOUNTER — Encounter: Payer: Self-pay | Admitting: Certified Nurse Midwife

## 2018-08-04 ENCOUNTER — Other Ambulatory Visit: Payer: Self-pay

## 2018-08-04 VITALS — BP 104/60 | Wt 175.4 lb

## 2018-08-04 DIAGNOSIS — O99332 Smoking (tobacco) complicating pregnancy, second trimester: Secondary | ICD-10-CM

## 2018-08-04 DIAGNOSIS — Z113 Encounter for screening for infections with a predominantly sexual mode of transmission: Secondary | ICD-10-CM

## 2018-08-04 DIAGNOSIS — O10912 Unspecified pre-existing hypertension complicating pregnancy, second trimester: Secondary | ICD-10-CM

## 2018-08-04 DIAGNOSIS — Z130 Encounter for screening for diseases of the blood and blood-forming organs and certain disorders involving the immune mechanism: Secondary | ICD-10-CM

## 2018-08-04 DIAGNOSIS — F17210 Nicotine dependence, cigarettes, uncomplicated: Secondary | ICD-10-CM

## 2018-08-04 DIAGNOSIS — O0990 Supervision of high risk pregnancy, unspecified, unspecified trimester: Secondary | ICD-10-CM

## 2018-08-04 DIAGNOSIS — O10919 Unspecified pre-existing hypertension complicating pregnancy, unspecified trimester: Secondary | ICD-10-CM

## 2018-08-04 DIAGNOSIS — Z131 Encounter for screening for diabetes mellitus: Secondary | ICD-10-CM

## 2018-08-04 DIAGNOSIS — Z3A26 26 weeks gestation of pregnancy: Secondary | ICD-10-CM

## 2018-08-04 DIAGNOSIS — F419 Anxiety disorder, unspecified: Secondary | ICD-10-CM | POA: Insufficient documentation

## 2018-08-04 DIAGNOSIS — O99342 Other mental disorders complicating pregnancy, second trimester: Secondary | ICD-10-CM

## 2018-08-04 DIAGNOSIS — O99330 Smoking (tobacco) complicating pregnancy, unspecified trimester: Secondary | ICD-10-CM

## 2018-08-04 LAB — POCT URINALYSIS DIPSTICK OB
Glucose, UA: NEGATIVE
POC,PROTEIN,UA: NEGATIVE

## 2018-08-04 NOTE — Progress Notes (Signed)
ROB- patient states left side of stomach hurts all the time for the last 4-5 days, pain stays there through out day, hands are still going numb and is driving patient crazy

## 2018-08-04 NOTE — Progress Notes (Signed)
HROB at 26wk1d: CHTN on labetalol 100 mgm BID. BP 104/60 today. Normotensive at home.  Sometimes feels lightheaded if in warm environment. Discussed stopping labetalol at this time. Patient wishes to continue on daily dosing in evening. Continues on ASA 81 mgm daily. Baby active. Hx of prior Cesarean section for FITL. Discussed TOLAC vs repeat Cesarean section. Aware of risk of uterine rupture with spontaneous labor is <1%. Is not interested in TOLAC. Desires repeat Cesarean section.  Tobacco use: now smoking 3-4 cigs/day. Tried nicorette patch x 3 weeks. Stopped when developed skin burn from patch. Nicorette gum caused nausea and vomiting. Anxiety: continues on Lexapro 10 mgm daily. HROB in 2 weeks with growth scan and 28 week labs  Note FHT and FH were from 08/05/2018.  Dalia Heading, CNM

## 2018-08-05 MED ORDER — LABETALOL HCL 100 MG PO TABS: 100.0000 mg | ORAL_TABLET | Freq: Every day | ORAL | 6 refills | Status: DC

## 2018-08-19 ENCOUNTER — Other Ambulatory Visit: Payer: Medicaid Other

## 2018-08-19 ENCOUNTER — Ambulatory Visit (INDEPENDENT_AMBULATORY_CARE_PROVIDER_SITE_OTHER): Payer: Medicaid Other | Admitting: Obstetrics and Gynecology

## 2018-08-19 ENCOUNTER — Other Ambulatory Visit: Payer: Self-pay

## 2018-08-19 ENCOUNTER — Encounter: Payer: Self-pay | Admitting: Obstetrics and Gynecology

## 2018-08-19 ENCOUNTER — Ambulatory Visit (INDEPENDENT_AMBULATORY_CARE_PROVIDER_SITE_OTHER): Payer: Medicaid Other

## 2018-08-19 VITALS — BP 140/82 | Wt 177.0 lb

## 2018-08-19 DIAGNOSIS — Z131 Encounter for screening for diabetes mellitus: Secondary | ICD-10-CM

## 2018-08-19 DIAGNOSIS — Z3A28 28 weeks gestation of pregnancy: Secondary | ICD-10-CM

## 2018-08-19 DIAGNOSIS — Z362 Encounter for other antenatal screening follow-up: Secondary | ICD-10-CM

## 2018-08-19 DIAGNOSIS — O99330 Smoking (tobacco) complicating pregnancy, unspecified trimester: Secondary | ICD-10-CM

## 2018-08-19 DIAGNOSIS — O99210 Obesity complicating pregnancy, unspecified trimester: Secondary | ICD-10-CM

## 2018-08-19 DIAGNOSIS — Z130 Encounter for screening for diseases of the blood and blood-forming organs and certain disorders involving the immune mechanism: Secondary | ICD-10-CM

## 2018-08-19 DIAGNOSIS — O0993 Supervision of high risk pregnancy, unspecified, third trimester: Secondary | ICD-10-CM

## 2018-08-19 DIAGNOSIS — O0990 Supervision of high risk pregnancy, unspecified, unspecified trimester: Secondary | ICD-10-CM

## 2018-08-19 DIAGNOSIS — Z113 Encounter for screening for infections with a predominantly sexual mode of transmission: Secondary | ICD-10-CM

## 2018-08-19 DIAGNOSIS — O99333 Smoking (tobacco) complicating pregnancy, third trimester: Secondary | ICD-10-CM

## 2018-08-19 DIAGNOSIS — O99213 Obesity complicating pregnancy, third trimester: Secondary | ICD-10-CM

## 2018-08-19 DIAGNOSIS — O09299 Supervision of pregnancy with other poor reproductive or obstetric history, unspecified trimester: Secondary | ICD-10-CM

## 2018-08-19 DIAGNOSIS — O09293 Supervision of pregnancy with other poor reproductive or obstetric history, third trimester: Secondary | ICD-10-CM

## 2018-08-19 DIAGNOSIS — K219 Gastro-esophageal reflux disease without esophagitis: Secondary | ICD-10-CM

## 2018-08-19 DIAGNOSIS — O10919 Unspecified pre-existing hypertension complicating pregnancy, unspecified trimester: Secondary | ICD-10-CM

## 2018-08-19 NOTE — Patient Instructions (Signed)
.  Preeclampsia and Eclampsia    Preeclampsia is a serious condition that may develop during pregnancy. It is also called toxemia of pregnancy. This condition causes high blood pressure along with other symptoms, such as swelling and headaches. These symptoms may develop as the condition gets worse. Preeclampsia may occur at 20 weeks of pregnancy or later.  Diagnosing and treating preeclampsia early is very important. If not treated early, it can cause serious problems for you and your baby. One problem it can lead to is eclampsia. Eclampsia is a condition that causes muscle jerking or shaking (convulsions or seizures) and other serious problems for the mother. During pregnancy, delivering your baby may be the best treatment for preeclampsia or eclampsia. For most women, preeclampsia and eclampsia symptoms go away after giving birth.  In rare cases, a woman may develop preeclampsia after giving birth (postpartum preeclampsia). This usually occurs within 48 hours after childbirth but may occur up to 6 weeks after giving birth.  What are the causes?  The cause of preeclampsia is not known.  What increases the risk?  The following risk factors make you more likely to develop preeclampsia:   Being pregnant for the first time.   Having had preeclampsia during a past pregnancy.   Having a family history of preeclampsia.   Having high blood pressure.   Being pregnant with more than one baby.   Being 35 or older.   Being African-American.   Having kidney disease or diabetes.   Having medical conditions such as lupus or blood diseases.   Being very overweight (obese).  What are the signs or symptoms?  The earliest signs of preeclampsia are:   High blood pressure.   Increased protein in your urine. Your health care provider will check for this at every visit before you give birth (prenatal visit).  Other symptoms that may develop as the condition gets worse include:   Severe headaches.   Sudden weight  gain.   Swelling of the hands, face, legs, and feet.   Nausea and vomiting.   Vision problems, such as blurred or double vision.   Numbness in the face, arms, legs, and feet.   Urinating less than usual.   Dizziness.   Slurred speech.   Abdominal pain, especially upper abdominal pain.   Convulsions or seizures.  How is this diagnosed?  There are no screening tests for preeclampsia. Your health care provider will ask you about symptoms and check for signs of preeclampsia during your prenatal visits. You may also have tests that include:   Urine tests.   Blood tests.   Checking your blood pressure.   Monitoring your baby's heart rate.   Ultrasound.  How is this treated?  You and your health care provider will determine the treatment approach that is best for you. Treatment may include:   Having more frequent prenatal exams to check for signs of preeclampsia, if you have an increased risk for preeclampsia.   Medicine to lower your blood pressure.   Staying in the hospital, if your condition is severe. There, treatment will focus on controlling your blood pressure and the amount of fluids in your body (fluid retention).   Taking medicine (magnesium sulfate) to prevent seizures. This may be given as an injection or through an IV.   Taking a low-dose aspirin during your pregnancy.   Delivering your baby early, if your condition gets worse. You may have your labor started with medicine (induced), or you may have a cesarean   delivery.  Follow these instructions at home:  Eating and drinking     Drink enough fluid to keep your urine pale yellow.   Avoid caffeine.  Lifestyle   Do not use any products that contain nicotine or tobacco, such as cigarettes and e-cigarettes. If you need help quitting, ask your health care provider.   Do not use alcohol or drugs.   Avoid stress as much as possible. Rest and get plenty of sleep.  General instructions   Take over-the-counter and prescription medicines only as  told by your health care provider.   When lying down, lie on your left side. This keeps pressure off your major blood vessels.   When sitting or lying down, raise (elevate) your feet. Try putting some pillows underneath your lower legs.   Exercise regularly. Ask your health care provider what kinds of exercise are best for you.   Keep all follow-up and prenatal visits as told by your health care provider. This is important.  How is this prevented?  There is no known way of preventing preeclampsia or eclampsia from developing. However, to lower your risk of complications and detect problems early:   Get regular prenatal care. Your health care provider may be able to diagnose and treat the condition early.   Maintain a healthy weight. Ask your health care provider for help managing weight gain during pregnancy.   Work with your health care provider to manage any long-term (chronic) health conditions you have, such as diabetes or kidney problems.   You may have tests of your blood pressure and kidney function after giving birth.   Your health care provider may have you take low-dose aspirin during your next pregnancy.  Contact a health care provider if:   You have symptoms that your health care provider told you may require more treatment or monitoring, such as:  ? Headaches.  ? Nausea or vomiting.  ? Abdominal pain.  ? Dizziness.  ? Light-headedness.  Get help right away if:   You have severe:  ? Abdominal pain.  ? Headaches that do not get better.  ? Dizziness.  ? Vision problems.  ? Confusion.  ? Nausea or vomiting.   You have any of the following:  ? A seizure.  ? Sudden, rapid weight gain.  ? Sudden swelling in your hands, ankles, or face.  ? Trouble moving any part of your body.  ? Numbness in any part of your body.  ? Trouble speaking.  ? Abnormal bleeding.   You faint.  Summary   Preeclampsia is a serious condition that may develop during pregnancy. It is also called toxemia of pregnancy.   This  condition causes high blood pressure along with other symptoms, such as swelling and headaches.   Diagnosing and treating preeclampsia early is very important. If not treated early, it can cause serious problems for you and your baby.   Get help right away if you have symptoms that your health care provider told you to watch for.  This information is not intended to replace advice given to you by your health care provider. Make sure you discuss any questions you have with your health care provider.  Document Released: 02/21/2000 Document Revised: 02/09/2017 Document Reviewed: 09/30/2015  Elsevier Interactive Patient Education  2019 [employer]

## 2018-08-19 NOTE — Progress Notes (Signed)
ROB/GTT/US C/o sore on the sides, and heart burn Denies lof, no vb, Good FM

## 2018-08-19 NOTE — Progress Notes (Signed)
Routine Prenatal Care Visit  Subjective  Carol Small is a 33 y.o. G3P1011 at [redacted]w[redacted]d being seen today for ongoing prenatal care.  She is currently monitored for the following issues for this high-risk pregnancy and has Hypertension; Major depressive disorder; Supervision of high risk pregnancy, antepartum; Obesity affecting pregnancy, antepartum; Tobacco use affecting pregnancy, antepartum; History of pre-eclampsia in prior pregnancy, currently pregnant; Chronic hypertension affecting pregnancy; and Anxiety on their problem list.  ----------------------------------------------------------------------------------- Patient reports abdominal discomfort.   Contractions: Not present. Vag. Bleeding: None.  Movement: Present. Denies leaking of fluid.  ----------------------------------------------------------------------------------- The following portions of the patient's history were reviewed and updated as appropriate: allergies, current medications, past family history, past medical history, past social history, past surgical history and problem list. Problem list updated.   Objective  Blood pressure 140/82, weight 177 lb (80.3 kg), last menstrual period 01/26/2018. Pregravid weight 171 lb (77.6 kg) Total Weight Gain 6 lb (2.722 kg) Urinalysis:      Fetal Status: Fetal Heart Rate (bpm): 134   Movement: Present  Presentation: Vertex  General:  Alert, oriented and cooperative. Patient is in no acute distress.  Skin: Skin is warm and dry. No rash noted.   Cardiovascular: Normal heart rate noted  Respiratory: Normal respiratory effort, no problems with respiration noted  Abdomen: Soft, gravid, appropriate for gestational age. Pain/Pressure: Present     Pelvic:  Cervical exam deferred        Extremities: Normal range of motion.  Edema: None  Mental Status: Normal mood and affect. Normal behavior. Normal judgment and thought content.     Assessment   33 y.o. G3P1011 at [redacted]w[redacted]d by   11/09/2018, by Ultrasound presenting for routine prenatal visit  Plan   pregnancy Problems (from 03/23/18 to present)    Problem Noted Resolved   Chronic hypertension affecting pregnancy 06/09/2018 by Will Bonnet, MD No   Overview Signed 06/09/2018 11:56 AM by Will Bonnet, MD    [ ]  Aspirin 81 mg daily after 12 weeks; discontinue after 36 weeks [ ]  baseline labs with CBC, CMP, urine protein/creatinine ratio [ ]  no BP meds unless BPs become elevated [ ]  ultrasound for growth at 28, 32, 36 weeks [ ]  Aspirin 81 mg daily after 12 weeks; discontinue after 36 weeks [ ]  Baseline EKG   Current antihypertensives:  Labetalol 100 mg bid  Baseline and surveillance labs (pulled in from [facility], refresh links as needed)  Lab Results  Component Value Date   PLT 277 04/12/2018   CREATININE 0.57 04/12/2018   AST 17 04/12/2018   ALT 13 04/12/2018    Antenatal Testing CHTN - O10.919  Group I  BP < 140/90, no preeclampsia, AGA,  nml AFV, +/- meds    Group Small BP > 140/90, on meds, no preeclampsia, AGA, nml AFV  20-28-34-38  20-24-28-32-35-38  32//2 x wk  28//BPP wkly then 32//2 x wk  40 no meds; 39 meds  PRN or 37  Pre-eclampsia  GHTN - O13.9/Preeclampsia without severe features  - O14.00   Preeclampsia with severe features - O14.10  Q 3-4wks  Q 2 wks  28//BPP wkly then 32//2 x wk  Inpatient  37  PRN or 60             Gestational age appropriate obstetric precautions including but not limited to vaginal bleeding, contractions, leaking of fluid and fetal movement were reviewed in detail with the patient.    Discussed blood pressure with patient.  She reports that she did not take her medication today. She had decreased to only 100mg  once a day because her BP was low. She declined a repeat blood pressure reading. She will follow up in 1 week. Encouraged her to resume taking labetalol 100 mg once a day. May need to increase.  High fluid, but not polyhydramnios. Will need  repeat growth in 4 weeks.   Return in about 1 week (around 08/26/2018) for ROB in person.  Homero Fellers MD [facility] 08/19/2018, 11:07 AM

## 2018-08-20 LAB — 28 WEEK RH+PANEL
Basophils Absolute: 0 10*3/uL (ref 0.0–0.2)
Basos: 0 %
EOS (ABSOLUTE): 0.1 10*3/uL (ref 0.0–0.4)
Eos: 1 %
Gestational Diabetes Screen: 158 mg/dL — ABNORMAL HIGH (ref 65–139)
HIV Screen 4th Generation wRfx: NONREACTIVE
Hematocrit: 31 % — ABNORMAL LOW (ref 34.0–46.6)
Hemoglobin: 10.5 g/dL — ABNORMAL LOW (ref 11.1–15.9)
Immature Grans (Abs): 0.1 10*3/uL (ref 0.0–0.1)
Immature Granulocytes: 1 %
Lymphocytes Absolute: 3.1 10*3/uL (ref 0.7–3.1)
Lymphs: 22 %
MCH: 31.4 pg (ref 26.6–33.0)
MCHC: 33.9 g/dL (ref 31.5–35.7)
MCV: 93 fL (ref 79–97)
Monocytes Absolute: 1 10*3/uL — ABNORMAL HIGH (ref 0.1–0.9)
Monocytes: 7 %
Neutrophils Absolute: 10 10*3/uL — ABNORMAL HIGH (ref 1.4–7.0)
Neutrophils: 69 %
Platelets: 288 10*3/uL (ref 150–450)
RBC: 3.34 x10E6/uL — ABNORMAL LOW (ref 3.77–5.28)
RDW: 12.7 % (ref 11.7–15.4)
RPR Ser Ql: NONREACTIVE
WBC: 14.3 10*3/uL — ABNORMAL HIGH (ref 3.4–10.8)

## 2018-08-24 ENCOUNTER — Ambulatory Visit (INDEPENDENT_AMBULATORY_CARE_PROVIDER_SITE_OTHER): Payer: Medicaid Other | Admitting: Obstetrics and Gynecology

## 2018-08-24 ENCOUNTER — Other Ambulatory Visit: Payer: Self-pay

## 2018-08-24 VITALS — BP 142/82 | Wt 179.0 lb

## 2018-08-24 DIAGNOSIS — Z3A29 29 weeks gestation of pregnancy: Secondary | ICD-10-CM

## 2018-08-24 DIAGNOSIS — O99210 Obesity complicating pregnancy, unspecified trimester: Secondary | ICD-10-CM

## 2018-08-24 DIAGNOSIS — O0993 Supervision of high risk pregnancy, unspecified, third trimester: Secondary | ICD-10-CM

## 2018-08-24 DIAGNOSIS — O09299 Supervision of pregnancy with other poor reproductive or obstetric history, unspecified trimester: Secondary | ICD-10-CM

## 2018-08-24 DIAGNOSIS — O99810 Abnormal glucose complicating pregnancy: Secondary | ICD-10-CM

## 2018-08-24 DIAGNOSIS — O09293 Supervision of pregnancy with other poor reproductive or obstetric history, third trimester: Secondary | ICD-10-CM

## 2018-08-24 DIAGNOSIS — O10919 Unspecified pre-existing hypertension complicating pregnancy, unspecified trimester: Secondary | ICD-10-CM

## 2018-08-24 DIAGNOSIS — O0990 Supervision of high risk pregnancy, unspecified, unspecified trimester: Secondary | ICD-10-CM

## 2018-08-24 DIAGNOSIS — O99213 Obesity complicating pregnancy, third trimester: Secondary | ICD-10-CM

## 2018-08-24 DIAGNOSIS — O10913 Unspecified pre-existing hypertension complicating pregnancy, third trimester: Secondary | ICD-10-CM

## 2018-08-24 LAB — POCT URINALYSIS DIPSTICK OB: Glucose, UA: NEGATIVE

## 2018-08-24 NOTE — Progress Notes (Signed)
Routine Prenatal Care Visit  Subjective  Carol Small II is a 33 y.o. G3P1011 at [redacted]w[redacted]d being seen today for ongoing prenatal care.  She is currently monitored for the following issues for this high-risk pregnancy and has Hypertension; Major depressive disorder; Supervision of high risk pregnancy, antepartum; Obesity affecting pregnancy, antepartum; Tobacco use affecting pregnancy, antepartum; History of pre-eclampsia in prior pregnancy, currently pregnant; Chronic hypertension affecting pregnancy; and Anxiety on their problem list.  ----------------------------------------------------------------------------------- Patient reports some LUQ pain, positional, reproducible with palpation.  Reports increased lower extremity swelling, also some swelling in hands with carpel tunnel symptoms.  No headaches or vision changes  Contractions: Not present. Vag. Bleeding: None.  Movement: Present. Denies leaking of fluid.  ----------------------------------------------------------------------------------- The following portions of the patient's history were reviewed and updated as appropriate: allergies, current medications, past family history, past medical history, past social history, past surgical history and problem list. Problem list updated.   Objective  Blood pressure (!) 142/82, weight 179 lb (81.2 kg), last menstrual period 01/26/2018. Pregravid weight 171 lb (77.6 kg) Total Weight Gain 8 lb (3.629 kg) Urinalysis:      Fetal Status: Fetal Heart Rate (bpm): 140   Movement: Present     General:  Alert, oriented and cooperative. Patient is in no acute distress.  Skin: Skin is warm and dry. No rash noted.   Cardiovascular: Normal heart rate noted  Respiratory: Normal respiratory effort, no problems with respiration noted  Abdomen: Soft, gravid, appropriate for gestational age. Pain/Pressure: Present     Pelvic:  Cervical exam deferred        Extremities: Normal range of motion.     ental  Status: Normal mood and affect. Normal behavior. Normal judgment and thought content.     Assessment   33 y.o. G3P1011 at [redacted]w[redacted]d by  11/09/2018, by Ultrasound presenting for routine prenatal visit  Plan   pregnancy Problems (from 03/23/18 to present)    Problem Noted Resolved   Chronic hypertension affecting pregnancy 06/09/2018 by Will Bonnet, MD No   Overview Signed 06/09/2018 11:56 AM by Will Bonnet, MD    [X]  Aspirin 81 mg daily after 12 weeks; discontinue after 36 weeks [X]  baseline labs with CBC, CMP, urine protein/creatinine ratio [ ]  no BP meds unless BPs become elevated [X]  ultrasound for growth at 28 1,299g (2lbs 14oz) c/w 60.8%ile, 32, 36 weeks [ ]  Aspirin 81 mg daily after 12 weeks; discontinue after 36 weeks [ ]  Baseline EKG   Current antihypertensives:  Labetalol 100 mg bid  Baseline and surveillance labs (pulled in from [facility], refresh links as needed)  Lab Results  Component Value Date   PLT 277 04/12/2018   CREATININE 0.57 04/12/2018   AST 17 04/12/2018   ALT 13 04/12/2018    Antenatal Testing CHTN - O10.919  Group I  BP < 140/90, no preeclampsia, AGA,  nml AFV, +/- meds    Group II BP > 140/90, on meds, no preeclampsia, AGA, nml AFV  20-28-34-38  20-24-28-32-35-38  32//2 x wk  28//BPP wkly then 32//2 x wk  40 no meds; 39 meds  PRN or 37  Pre-eclampsia  GHTN - O13.9/Preeclampsia without severe features  - O14.00   Preeclampsia with severe features - O14.10  Q 3-4wks  Q 2 wks  28//BPP wkly then 32//2 x wk  Inpatient  37  PRN or 18             Gestational age appropriate obstetric precautions including but  not limited to vaginal bleeding, contractions, leaking of fluid and fetal movement were reviewed in detail with the patient.    - repeat preeclampsia labs today.  BP stable and still within goal, continues on ASA and labetalol - weight stable 8lbs weight gain total this pregnancy, and 2lbs since last week - no significant  pitting edema - carpel tunnel with +Tinel's sign discussed possibility of orthopedic referral for steroid injections.  Has tried braces but did not work - follow up BP check and 3-hr for abnormal 1-hr in 2 days   Return in about 2 days (around 08/26/2018) for ROB, 3-hr OGTT, repeat BP check.  Malachy Mood, MD, [facility], [facility] 08/24/2018, 12:23 PM

## 2018-08-24 NOTE — Progress Notes (Signed)
ROB C/o LQ pain that is constant, swelling in extremities  Denies lof, no vb, Good FM

## 2018-08-25 LAB — COMPREHENSIVE METABOLIC PANEL
ALT: 12 IU/L (ref 0–32)
AST: 19 IU/L (ref 0–40)
Albumin/Globulin Ratio: 1.5 (ref 1.2–2.2)
Albumin: 3.3 g/dL — ABNORMAL LOW (ref 3.8–4.8)
Alkaline Phosphatase: 78 IU/L (ref 39–117)
BUN/Creatinine Ratio: 11 (ref 9–23)
BUN: 7 mg/dL (ref 6–20)
Bilirubin Total: 0.2 mg/dL (ref 0.0–1.2)
CO2: 20 mmol/L (ref 20–29)
Calcium: 9.1 mg/dL (ref 8.7–10.2)
Chloride: 104 mmol/L (ref 96–106)
Creatinine, Ser: 0.64 mg/dL (ref 0.57–1.00)
GFR calc Af Amer: 137 mL/min/{1.73_m2} (ref >59)
GFR calc non Af Amer: 118 mL/min/{1.73_m2} (ref >59)
Globulin, Total: 2.2 g/dL (ref 1.5–4.5)
Glucose: 92 mg/dL (ref 65–99)
Potassium: 3.7 mmol/L (ref 3.5–5.2)
Sodium: 137 mmol/L (ref 134–144)
Total Protein: 5.5 g/dL — ABNORMAL LOW (ref 6.0–8.5)

## 2018-08-25 LAB — CBC
Hematocrit: 30.9 % — ABNORMAL LOW (ref 34.0–46.6)
Hemoglobin: 10.1 g/dL — ABNORMAL LOW (ref 11.1–15.9)
MCH: 30.9 pg (ref 26.6–33.0)
MCHC: 32.7 g/dL (ref 31.5–35.7)
MCV: 95 fL (ref 79–97)
Platelets: 305 10*3/uL (ref 150–450)
RBC: 3.27 x10E6/uL — ABNORMAL LOW (ref 3.77–5.28)
RDW: 12.8 % (ref 11.7–15.4)
WBC: 14.2 10*3/uL — ABNORMAL HIGH (ref 3.4–10.8)

## 2018-08-25 LAB — PROTEIN / CREATININE RATIO, URINE
Creatinine, Urine: 226.1 mg/dL
Protein, Ur: 35.1 mg/dL
Protein/Creat Ratio: 155 mg/g creat (ref 0–200)

## 2018-08-29 ENCOUNTER — Other Ambulatory Visit: Payer: Self-pay | Admitting: Obstetrics and Gynecology

## 2018-08-29 ENCOUNTER — Telehealth: Payer: Self-pay

## 2018-08-29 DIAGNOSIS — G5600 Carpal tunnel syndrome, unspecified upper limb: Secondary | ICD-10-CM

## 2018-08-29 DIAGNOSIS — O26899 Other specified pregnancy related conditions, unspecified trimester: Secondary | ICD-10-CM

## 2018-08-29 NOTE — Telephone Encounter (Signed)
Pt calling c/o hands are still going numb and they hurt really bad.  [phone]  VM not set up yet.

## 2018-08-29 NOTE — Telephone Encounter (Signed)
Please advise 

## 2018-08-29 NOTE — Telephone Encounter (Signed)
Referral sent 

## 2018-08-29 NOTE — Telephone Encounter (Signed)
Pt returned call.  I adv wrist splints and tylenol.  Pt states she is wearing the wrist splints and it seem they hurt more.  She is also taking tylenol for h/a but it doesn't help with the wrist pain.  Pain is so bad she didn't want to drive to work this am. Michela Pitcher AMS was going to refer her to someone about her carpal tunnel.   Also c/o feet and hands are really swollen.  [phone]

## 2018-08-30 NOTE — Telephone Encounter (Signed)
Patient aware.

## 2018-08-30 NOTE — Telephone Encounter (Signed)
Tried to call pt to let her know. VM not set up, unable to leave a message

## 2018-09-01 ENCOUNTER — Other Ambulatory Visit: Payer: Self-pay

## 2018-09-01 ENCOUNTER — Other Ambulatory Visit: Payer: Medicaid Other

## 2018-09-01 ENCOUNTER — Encounter: Payer: Self-pay | Admitting: Advanced Practice Midwife

## 2018-09-01 ENCOUNTER — Ambulatory Visit (INDEPENDENT_AMBULATORY_CARE_PROVIDER_SITE_OTHER): Payer: Medicaid Other | Admitting: Advanced Practice Midwife

## 2018-09-01 VITALS — BP 122/70 | Wt 184.0 lb

## 2018-09-01 DIAGNOSIS — O99810 Abnormal glucose complicating pregnancy: Secondary | ICD-10-CM

## 2018-09-01 DIAGNOSIS — Z23 Encounter for immunization: Secondary | ICD-10-CM

## 2018-09-01 DIAGNOSIS — O0993 Supervision of high risk pregnancy, unspecified, third trimester: Secondary | ICD-10-CM

## 2018-09-01 DIAGNOSIS — O0990 Supervision of high risk pregnancy, unspecified, unspecified trimester: Secondary | ICD-10-CM

## 2018-09-01 DIAGNOSIS — O10919 Unspecified pre-existing hypertension complicating pregnancy, unspecified trimester: Secondary | ICD-10-CM

## 2018-09-01 DIAGNOSIS — O10913 Unspecified pre-existing hypertension complicating pregnancy, third trimester: Secondary | ICD-10-CM

## 2018-09-01 DIAGNOSIS — Z3A30 30 weeks gestation of pregnancy: Secondary | ICD-10-CM

## 2018-09-01 NOTE — Patient Instructions (Signed)
Third Trimester of Pregnancy The third trimester is from week 28 through week 40 (months 7 through 9). The third trimester is a time when the unborn baby (fetus) is growing rapidly. At the end of the ninth month, the fetus is about 20 inches in length and weighs 6-10 pounds. Body changes during your third trimester Your body will continue to go through many changes during pregnancy. The changes vary from woman to woman. During the third trimester:  Your weight will continue to increase. You can expect to gain 25-35 pounds (11-16 kg) by the end of the pregnancy.  You may begin to get stretch marks on your hips, abdomen, and breasts.  You may urinate more often because the fetus is moving lower into your pelvis and pressing on your bladder.  You may develop or continue to have heartburn. This is caused by increased hormones that slow down muscles in the digestive tract.  You may develop or continue to have constipation because increased hormones slow digestion and cause the muscles that push waste through your intestines to relax.  You may develop hemorrhoids. These are swollen veins (varicose veins) in the rectum that can itch or be painful.  You may develop swollen, bulging veins (varicose veins) in your legs.  You may have increased body aches in the pelvis, back, or thighs. This is due to weight gain and increased hormones that are relaxing your joints.  You may have changes in your hair. These can include thickening of your hair, rapid growth, and changes in texture. Some women also have hair loss during or after pregnancy, or hair that feels dry or thin. Your hair will most likely return to normal after your baby is born.  Your breasts will continue to grow and they will continue to become tender. A yellow fluid (colostrum) may leak from your breasts. This is the first milk you are producing for your baby.  Your belly button may stick out.  You may notice more swelling in your hands,  face, or ankles.  You may have increased tingling or numbness in your hands, arms, and legs. The skin on your belly may also feel numb.  You may feel short of breath because of your expanding uterus.  You may have more problems sleeping. This can be caused by the size of your belly, increased need to urinate, and an increase in your body's metabolism.  You may notice the fetus "dropping," or moving lower in your abdomen (lightening).  You may have increased vaginal discharge.  You may notice your joints feel loose and you may have pain around your pelvic bone. What to expect at prenatal visits You will have prenatal exams every 2 weeks until week 36. Then you will have weekly prenatal exams. During a routine prenatal visit:  You will be weighed to make sure you and the baby are growing normally.  Your blood pressure will be taken.  Your abdomen will be measured to track your baby's growth.  The fetal heartbeat will be listened to.  Any test results from the previous visit will be discussed.  You may have a cervical check near your due date to see if your cervix has softened or thinned (effaced).  You will be tested for Group B streptococcus. This happens between 35 and 37 weeks. Your health care provider may ask you:  What your birth plan is.  How you are feeling.  If you are feeling the baby move.  If you have had any abnormal   symptoms, such as leaking fluid, bleeding, severe headaches, or abdominal cramping.  If you are using any tobacco products, including cigarettes, chewing tobacco, and electronic cigarettes.  If you have any questions. Other tests or screenings that may be performed during your third trimester include:  Blood tests that check for low iron levels (anemia).  Fetal testing to check the health, activity level, and growth of the fetus. Testing is done if you have certain medical conditions or if there are problems during the pregnancy.  Nonstress test  (NST). This test checks the health of your baby to make sure there are no signs of problems, such as the baby not getting enough oxygen. During this test, a belt is placed around your belly. The baby is made to move, and its heart rate is monitored during movement. What is false labor? False labor is a condition in which you feel small, irregular tightenings of the muscles in the womb (contractions) that usually go away with rest, changing position, or drinking water. These are called Braxton Hicks contractions. Contractions may last for hours, days, or even weeks before true labor sets in. If contractions come at regular intervals, become more frequent, increase in intensity, or become painful, you should see your health care provider. What are the signs of labor?  Abdominal cramps.  Regular contractions that start at 10 minutes apart and become stronger and more frequent with time.  Contractions that start on the top of the uterus and spread down to the lower abdomen and back.  Increased pelvic pressure and dull back pain.  A watery or bloody mucus discharge that comes from the vagina.  Leaking of amniotic fluid. This is also known as your "water breaking." It could be a slow trickle or a gush. Let your health care provider know if it has a color or strange odor. If you have any of these signs, call your health care provider right away, even if it is before your due date. Follow these instructions at home: Medicines  Follow your health care provider's instructions regarding medicine use. Specific medicines may be either safe or unsafe to take during pregnancy.  Take a prenatal vitamin that contains at least 600 micrograms (mcg) of folic acid.  If you develop constipation, try taking a stool softener if your health care provider approves. Eating and drinking   Eat a balanced diet that includes fresh fruits and vegetables, whole grains, good sources of protein such as meat, eggs, or tofu,  and low-fat dairy. Your health care provider will help you determine the amount of weight gain that is right for you.  Avoid raw meat and uncooked cheese. These carry germs that can cause birth defects in the baby.  If you have low calcium intake from food, talk to your health care provider about whether you should take a daily calcium supplement.  Eat four or five small meals rather than three large meals a day.  Limit foods that are high in fat and processed sugars, such as fried and sweet foods.  To prevent constipation: ? Drink enough fluid to keep your urine clear or pale yellow. ? Eat foods that are high in fiber, such as fresh fruits and vegetables, whole grains, and beans. Activity  Exercise only as directed by your health care provider. Most women can continue their usual exercise routine during pregnancy. Try to exercise for 30 minutes at least 5 days a week. Stop exercising if you experience uterine contractions.  Avoid heavy lifting.  Do   not exercise in extreme heat or humidity, or at high altitudes.  Wear low-heel, comfortable shoes.  Practice good posture.  You may continue to have sex unless your health care provider tells you otherwise. Relieving pain and discomfort  Take frequent breaks and rest with your legs elevated if you have leg cramps or low back pain.  Take warm sitz baths to soothe any pain or discomfort caused by hemorrhoids. Use hemorrhoid cream if your health care provider approves.  Wear a good support bra to prevent discomfort from breast tenderness.  If you develop varicose veins: ? Wear support pantyhose or compression stockings as told by your healthcare provider. ? Elevate your feet for 15 minutes, 3-4 times a day. Prenatal care  Write down your questions. Take them to your prenatal visits.  Keep all your prenatal visits as told by your health care provider. This is important. Safety  Wear your seat belt at all times when driving.  Make  a list of emergency phone numbers, including numbers for family, friends, the hospital, and police and fire departments. General instructions  Avoid cat litter boxes and soil used by cats. These carry germs that can cause birth defects in the baby. If you have a cat, ask someone to clean the litter box for you.  Do not travel far distances unless it is absolutely necessary and only with the approval of your health care provider.  Do not use hot tubs, steam rooms, or saunas.  Do not drink alcohol.  Do not use any products that contain nicotine or tobacco, such as cigarettes and e-cigarettes. If you need help quitting, ask your health care provider.  Do not use any medicinal herbs or unprescribed drugs. These chemicals affect the formation and growth of the baby.  Do not douche or use tampons or scented sanitary pads.  Do not cross your legs for long periods of time.  To prepare for the arrival of your baby: ? Take prenatal classes to understand, practice, and ask questions about labor and delivery. ? Make a trial run to the hospital. ? Visit the hospital and tour the maternity area. ? Arrange for maternity or paternity leave through employers. ? Arrange for family and friends to take care of pets while you are in the hospital. ? Purchase a rear-facing car seat and make sure you know how to install it in your car. ? Pack your hospital bag. ? Prepare the baby's nursery. Make sure to remove all pillows and stuffed animals from the baby's crib to prevent suffocation.  Visit your dentist if you have not gone during your pregnancy. Use a soft toothbrush to brush your teeth and be gentle when you floss. Contact a health care provider if:  You are unsure if you are in labor or if your water has broken.  You become dizzy.  You have mild pelvic cramps, pelvic pressure, or nagging pain in your abdominal area.  You have lower back pain.  You have persistent nausea, vomiting, or  diarrhea.  You have an unusual or bad smelling vaginal discharge.  You have pain when you urinate. Get help right away if:  Your water breaks before 37 weeks.  You have regular contractions less than 5 minutes apart before 37 weeks.  You have a fever.  You are leaking fluid from your vagina.  You have spotting or bleeding from your vagina.  You have severe abdominal pain or cramping.  You have rapid weight loss or weight gain.  You have   shortness of breath with chest pain.  You notice sudden or extreme swelling of your face, hands, ankles, feet, or legs.  Your baby makes fewer than 10 movements in 2 hours.  You have severe headaches that do not go away when you take medicine.  You have vision changes. Summary  The third trimester is from week 28 through week 40, months 7 through 9. The third trimester is a time when the unborn baby (fetus) is growing rapidly.  During the third trimester, your discomfort may increase as you and your baby continue to gain weight. You may have abdominal, leg, and back pain, sleeping problems, and an increased need to urinate.  During the third trimester your breasts will keep growing and they will continue to become tender. A yellow fluid (colostrum) may leak from your breasts. This is the first milk you are producing for your baby.  False labor is a condition in which you feel small, irregular tightenings of the muscles in the womb (contractions) that eventually go away. These are called Braxton Hicks contractions. Contractions may last for hours, days, or even weeks before true labor sets in.  Signs of labor can include: abdominal cramps; regular contractions that start at 10 minutes apart and become stronger and more frequent with time; watery or bloody mucus discharge that comes from the vagina; increased pelvic pressure and dull back pain; and leaking of amniotic fluid. This information is not intended to replace advice given to you by your  health care provider. Make sure you discuss any questions you have with your health care provider. Document Released: 02/17/2001 Document Revised: 03/31/2016 Document Reviewed: 03/31/2016 Elsevier Interactive Patient Education  2019 [employer]  

## 2018-09-01 NOTE — Progress Notes (Signed)
ROB/3 Hr GTT Tdap/ BT consent today  C/o swelling in hands and feet, carpal tunnel pain in wrist and hands

## 2018-09-01 NOTE — Progress Notes (Signed)
Routine Prenatal Care Visit  Subjective  Carol Small is a 33 y.o. G3P1011 at [redacted]w[redacted]d being seen today for ongoing prenatal care.  She is currently monitored for the following issues for this high-risk pregnancy and has Hypertension; Major depressive disorder; Supervision of high risk pregnancy, antepartum; Obesity affecting pregnancy, antepartum; Tobacco use affecting pregnancy, antepartum; History of pre-eclampsia in prior pregnancy, currently pregnant; Chronic hypertension affecting pregnancy; and Anxiety on their problem list.  ----------------------------------------------------------------------------------- Patient reports bilateral abdominal pain. She wonders if she will have to deliver before 39 weeks. We discussed ongoing assessment of pregnancy risk factors prior to making that decision.  3 hour gtt today. She did not take her Labetalol this morning in preparation for gtt. She denies headache, visual changes Contractions: Not present. Vag. Bleeding: None.  Movement: Present. Denies leaking of fluid.  ----------------------------------------------------------------------------------- The following portions of the patient's history were reviewed and updated as appropriate: allergies, current medications, past family history, past medical history, past social history, past surgical history and problem list. Problem list updated.   Objective  Blood pressure 122/70, weight 184 lb (83.5 kg), last menstrual period 01/26/2018. Pregravid weight 171 lb (77.6 kg) Total Weight Gain 13 lb (5.897 kg) Urinalysis: Urine Protein    Urine Glucose    Fetal Status: Fetal Heart Rate (bpm): 137 Fundal Height: 32 cm Movement: Present     General:  Alert, oriented and cooperative. Patient is in no acute distress.  Skin: Skin is warm and dry. No rash noted.   Cardiovascular: Normal heart rate noted  Respiratory: Normal respiratory effort, no problems with respiration noted  Abdomen: Soft, gravid,  appropriate for gestational age. Pain/Pressure: Present     Pelvic:  Cervical exam deferred        Extremities: Normal range of motion.  Edema: None  Mental Status: Normal mood and affect. Normal behavior. Normal judgment and thought content.   Assessment   33 y.o. G3P1011 at [redacted]w[redacted]d by  11/09/2018, by Ultrasound presenting for routine prenatal visit  Plan   pregnancy Problems (from 03/23/18 to present)    Problem Noted Resolved   Chronic hypertension affecting pregnancy 06/09/2018 by Will Bonnet, MD No   Overview Signed 06/09/2018 11:56 AM by Will Bonnet, MD    [ ]  Aspirin 81 mg daily after 12 weeks; discontinue after 36 weeks [ ]  baseline labs with CBC, CMP, urine protein/creatinine ratio [ ]  no BP meds unless BPs become elevated [ ]  ultrasound for growth at 28, 32, 36 weeks [ ]  Aspirin 81 mg daily after 12 weeks; discontinue after 36 weeks [ ]  Baseline EKG   Current antihypertensives:  Labetalol 100 mg bid  Baseline and surveillance labs (pulled in from [facility], refresh links as needed)  Lab Results  Component Value Date   PLT 277 04/12/2018   CREATININE 0.57 04/12/2018   AST 17 04/12/2018   ALT 13 04/12/2018    Antenatal Testing CHTN - O10.919  Group I  BP < 140/90, no preeclampsia, AGA,  nml AFV, +/- meds    Group Small BP > 140/90, on meds, no preeclampsia, AGA, nml AFV  20-28-34-38  20-24-28-32-35-38  32//2 x wk  28//BPP wkly then 32//2 x wk  40 no meds; 39 meds  PRN or 37  Pre-eclampsia  GHTN - O13.9/Preeclampsia without severe features  - O14.00   Preeclampsia with severe features - O14.10  Q 3-4wks  Q 2 wks  28//BPP wkly then 32//2 x wk  Inpatient  37  PRN or  34             Preterm labor symptoms and general obstetric precautions including but not limited to vaginal bleeding, contractions, leaking of fluid and fetal movement were reviewed in detail with the patient. Please refer to After Visit Summary for other counseling recommendations.    Return in about 2 weeks (around 09/15/2018) for growth scan and rob.  Rod Can, CNM 09/01/2018 10:21 AM

## 2018-09-01 NOTE — Addendum Note (Signed)
Addended by: Raechel Chute on: 09/01/2018 03:37 PM   Modules accepted: Orders

## 2018-09-02 LAB — GESTATIONAL GLUCOSE TOLERANCE
Glucose, Fasting: 83 mg/dL (ref 65–94)
Glucose, GTT - 1 Hour: 162 mg/dL (ref 65–179)
Glucose, GTT - 2 Hour: 120 mg/dL (ref 65–154)
Glucose, GTT - 3 Hour: 113 mg/dL (ref 65–139)

## 2018-09-04 ENCOUNTER — Other Ambulatory Visit: Payer: Self-pay

## 2018-09-04 ENCOUNTER — Observation Stay
Admission: EM | Admit: 2018-09-04 | Discharge: 2018-09-04 | Disposition: A | Discharge status: Home / Self Care | Payer: Medicaid Other | Attending: Obstetrics and Gynecology | Admitting: Obstetrics and Gynecology

## 2018-09-04 ENCOUNTER — Encounter: Payer: Self-pay | Admitting: *Deleted

## 2018-09-04 DIAGNOSIS — R109 Unspecified abdominal pain: Secondary | ICD-10-CM | POA: Diagnosis not present

## 2018-09-04 DIAGNOSIS — O10913 Unspecified pre-existing hypertension complicating pregnancy, third trimester: Secondary | ICD-10-CM | POA: Insufficient documentation

## 2018-09-04 DIAGNOSIS — Z881 Allergy status to other antibiotic agents status: Secondary | ICD-10-CM | POA: Insufficient documentation

## 2018-09-04 DIAGNOSIS — R51 Headache: Secondary | ICD-10-CM | POA: Diagnosis not present

## 2018-09-04 DIAGNOSIS — O26893 Other specified pregnancy related conditions, third trimester: Principal | ICD-10-CM | POA: Diagnosis present

## 2018-09-04 DIAGNOSIS — O34219 Maternal care for unspecified type scar from previous cesarean delivery: Secondary | ICD-10-CM | POA: Insufficient documentation

## 2018-09-04 DIAGNOSIS — Z3A30 30 weeks gestation of pregnancy: Secondary | ICD-10-CM | POA: Insufficient documentation

## 2018-09-04 DIAGNOSIS — Z888 Allergy status to other drugs, medicaments and biological substances status: Secondary | ICD-10-CM | POA: Diagnosis not present

## 2018-09-04 DIAGNOSIS — O10919 Unspecified pre-existing hypertension complicating pregnancy, unspecified trimester: Secondary | ICD-10-CM

## 2018-09-04 LAB — COMPREHENSIVE METABOLIC PANEL
ALT: 19 U/L (ref 0–44)
AST: 23 U/L (ref 15–41)
Albumin: 3 g/dL — ABNORMAL LOW (ref 3.5–5.0)
Alkaline Phosphatase: 97 U/L (ref 38–126)
Anion gap: 8 (ref 5–15)
BUN: 9 mg/dL (ref 6–20)
CO2: 22 mmol/L (ref 22–32)
Calcium: 8.4 mg/dL — ABNORMAL LOW (ref 8.9–10.3)
Chloride: 107 mmol/L (ref 98–111)
Creatinine, Ser: 0.49 mg/dL (ref 0.44–1.00)
GFR calc Af Amer: >60 mL/min (ref >60)
GFR calc non Af Amer: >60 mL/min (ref >60)
Glucose, Bld: 87 mg/dL (ref 70–99)
Potassium: 3.4 mmol/L — ABNORMAL LOW (ref 3.5–5.1)
Sodium: 137 mmol/L (ref 135–145)
Total Bilirubin: 0.5 mg/dL (ref 0.3–1.2)
Total Protein: 6 g/dL — ABNORMAL LOW (ref 6.5–8.1)

## 2018-09-04 LAB — URINALYSIS, COMPLETE (UACMP) WITH MICROSCOPIC
Bacteria, UA: NONE SEEN
Bilirubin Urine: NEGATIVE
Glucose, UA: NEGATIVE mg/dL
Hgb urine dipstick: NEGATIVE
Ketones, ur: NEGATIVE mg/dL
Leukocytes,Ua: NEGATIVE
Nitrite: NEGATIVE
Protein, ur: 30 mg/dL — AB
Specific Gravity, Urine: 1.025 (ref 1.005–1.030)
pH: 7 (ref 5.0–8.0)

## 2018-09-04 LAB — PROTEIN / CREATININE RATIO, URINE
Creatinine, Urine: 224 mg/dL
Protein Creatinine Ratio: 0.15 mg/mg{Cre} (ref 0.00–0.15)
Total Protein, Urine: 34 mg/dL

## 2018-09-04 LAB — CBC
HCT: 29.2 % — ABNORMAL LOW (ref 36.0–46.0)
Hemoglobin: 9.6 g/dL — ABNORMAL LOW (ref 12.0–15.0)
MCH: 30.4 pg (ref 26.0–34.0)
MCHC: 32.9 g/dL (ref 30.0–36.0)
MCV: 92.4 fL (ref 80.0–100.0)
Platelets: 321 10*3/uL (ref 150–400)
RBC: 3.16 MIL/uL — ABNORMAL LOW (ref 3.87–5.11)
RDW: 13.4 % (ref 11.5–15.5)
WBC: 14.7 10*3/uL — ABNORMAL HIGH (ref 4.0–10.5)
nRBC: 0 % (ref 0.0–0.2)

## 2018-09-04 LAB — LIPASE, BLOOD: Lipase: 33 U/L (ref 11–51)

## 2018-09-04 MED ORDER — ACETAMINOPHEN 325 MG PO TABS
650.0000 mg | ORAL_TABLET | Freq: Once | ORAL | Status: AC
  Administered 2018-09-04: 19:00:00 650 mg via ORAL
  Filled 2018-09-04: qty 2

## 2018-09-04 MED ORDER — ACETAMINOPHEN 325 MG PO TABS
ORAL_TABLET | ORAL | Status: AC
  Administered 2018-09-04: 650 mg via ORAL
  Filled 2018-09-04: qty 2

## 2018-09-04 MED ORDER — TRAZODONE HCL 50 MG PO TABS: 50.0000 mg | ORAL_TABLET | Freq: Every evening | ORAL | 1 refills | Status: AC | PRN

## 2018-09-04 MED ORDER — PROCHLORPERAZINE MALEATE 10 MG PO TABS
10.0000 mg | ORAL_TABLET | Freq: Once | ORAL | Status: AC
  Administered 2018-09-04: 19:00:00 10 mg via ORAL
  Filled 2018-09-04: qty 1

## 2018-09-04 NOTE — OB Triage Note (Signed)
[name] intermittent abdominal pain since Friday. Reports good fetal movement. Denies vaginal bleeding, LOF. Carol Small

## 2018-09-04 NOTE — Discharge Summary (Signed)
Physician Final Progress Note  Patient ID: Carol Small MRN: [id number] DOB/AGE: 06/22/1985 33 y.o.  Admit date: 09/04/2018 Admitting provider: Malachy Mood, MD Discharge date: 09/04/2018   Admission Diagnoses: Abdominal pain third trimester  Discharge Diagnoses:  Abdominal   33 y.o. G3P1011 at [redacted]w[redacted]d presenting with headache, and abdominal pain around her prior cesarean site.  The patient is being followed for a diagnosis CHTN, on labetalol, BP have been well controlled.  She has not taken anything for the headaches.  Abdominal pain has been intermittent.  States it feels like a hernia.  Sharp, positional.  No dysuria.  +FM, no LOF, no ctx.   BP normotensive on presentation.  Lab work stable, no change in WBC over past 3 and 2 weeks prior labs, negative lipase, negative UA.  Patient also reports problems sleeping at night provided with Rx Trazodone.  Given reassurance given normal fetal montioring, normal BP, and normal labs.  Headache improved with Tylenol and compazine.    pregnancy Problems (from 03/23/18 to present)    Problem Noted Resolved   Chronic hypertension affecting pregnancy 06/09/2018 by Will Bonnet, MD No   Overview Signed 06/09/2018 11:56 AM by Will Bonnet, MD    [ ]  Aspirin 81 mg daily after 12 weeks; discontinue after 36 weeks [ ]  baseline labs with CBC, CMP, urine protein/creatinine ratio [ ]  no BP meds unless BPs become elevated [ ]  ultrasound for growth at 28, 32, 36 weeks [ ]  Aspirin 81 mg daily after 12 weeks; discontinue after 36 weeks [ ]  Baseline EKG   Current antihypertensives:  Labetalol 100 mg bid  Baseline and surveillance labs (pulled in from [facility], refresh links as needed)  Lab Results  Component Value Date   PLT 277 04/12/2018   CREATININE 0.57 04/12/2018   AST 17 04/12/2018   ALT 13 04/12/2018    Antenatal Testing CHTN - O10.919  Group I  BP < 140/90, no preeclampsia, AGA,  nml AFV, +/- meds    Group II BP > 140/90, on meds,  no preeclampsia, AGA, nml AFV  20-28-34-38  20-24-28-32-35-38  32//2 x wk  28//BPP wkly then 32//2 x wk  40 no meds; 39 meds  PRN or 37  Pre-eclampsia  GHTN - O13.9/Preeclampsia without severe features  - O14.00   Preeclampsia with severe features - O14.10  Q 3-4wks  Q 2 wks  28//BPP wkly then 32//2 x wk  Inpatient  37  PRN or 34         Supervision of high risk pregnancy, antepartum 03/23/2018 by Rod Can, CNM No   Overview Addendum 09/03/2018 12:16 PM by Malachy Mood, MD    Clinic [facility] Prenatal Labs  Dating  5wk [place] Blood type: AB/Positive/-- (02/04 1650)   Genetic Screen  NIPS: normal xy Antibody:Negative (02/04 1650)  Anatomic [place] complete Rubella: 12.40 (02/04 1650) Varicella: Immune  GTT Early:99 Third trimester: 158 3-hr 83, 162, 120, 113 RPR: Non Reactive (02/04 1650)   Rhogam  not needed HBsAg: Negative (02/04 1650)   TDaP vaccine   Flu Shot: declines HIV: Non Reactive (02/04 1650)   Baby Food                                GBS:   Contraception  Pap: 2019 NIL  CBB     CS/VBAC 2013- desires repeat   Support Person Donyell  Weight:  [83.5 kg] 83.5 kg (06/28 1847)    Consults: None  Significant Findings/ Diagnostic Studies: Results for orders placed or performed during the hospital encounter of 09/04/18 (from the past 24 hour(s))  Protein / creatinine ratio, urine     Status: None   Collection Time: 09/04/18  7:11 PM  Result Value Ref Range   Creatinine, Urine 224 mg/dL   Total Protein, Urine 34 mg/dL   Protein Creatinine Ratio 0.15 0.00 - 0.15 mg/mg[Cre]  Urinalysis, Complete w Microscopic     Status: Abnormal   Collection Time: 09/04/18  7:11 PM  Result Value Ref Range   Color, Urine YELLOW (A) YELLOW   APPearance CLEAR (A) CLEAR   Specific Gravity, Urine 1.025 1.005 - 1.030   pH 7.0 5.0 - 8.0   Glucose, UA NEGATIVE NEGATIVE mg/dL   Hgb urine dipstick NEGATIVE NEGATIVE   Bilirubin Urine NEGATIVE NEGATIVE   Ketones, ur  NEGATIVE NEGATIVE mg/dL   Protein, ur 30 (A) NEGATIVE mg/dL   Nitrite NEGATIVE NEGATIVE   Leukocytes,Ua NEGATIVE NEGATIVE   RBC / HPF 0-5 0 - 5 RBC/hpf   WBC, UA 0-5 0 - 5 WBC/hpf   Bacteria, UA NONE SEEN NONE SEEN   Squamous Epithelial / LPF 0-5 0 - 5   Mucus PRESENT   Comprehensive metabolic panel     Status: Abnormal   Collection Time: 09/04/18  7:29 PM  Result Value Ref Range   Sodium 137 135 - 145 mmol/L   Potassium 3.4 (L) 3.5 - 5.1 mmol/L   Chloride 107 98 - 111 mmol/L   CO2 22 22 - 32 mmol/L   Glucose, Bld 87 70 - 99 mg/dL   BUN 9 6 - 20 mg/dL   Creatinine, Ser 0.49 0.44 - 1.00 mg/dL   Calcium 8.4 (L) 8.9 - 10.3 mg/dL   Total Protein 6.0 (L) 6.5 - 8.1 g/dL   Albumin 3.0 (L) 3.5 - 5.0 g/dL   AST 23 15 - 41 U/L   ALT 19 0 - 44 U/L   Alkaline Phosphatase 97 38 - 126 U/L   Total Bilirubin 0.5 0.3 - 1.2 mg/dL   GFR calc non Af Amer >60 >60 mL/min   GFR calc Af Amer >60 >60 mL/min   Anion gap 8 5 - 15  CBC     Status: Abnormal   Collection Time: 09/04/18  7:29 PM  Result Value Ref Range   WBC 14.7 (H) 4.0 - 10.5 K/uL   RBC 3.16 (L) 3.87 - 5.11 MIL/uL   Hemoglobin 9.6 (L) 12.0 - 15.0 g/dL   HCT 29.2 (L) 36.0 - 46.0 %   MCV 92.4 80.0 - 100.0 fL   MCH 30.4 26.0 - 34.0 pg   MCHC 32.9 30.0 - 36.0 g/dL   RDW 13.4 11.5 - 15.5 %   Platelets 321 150 - 400 K/uL   nRBC 0.0 0.0 - 0.2 %  Lipase, blood     Status: None   Collection Time: 09/04/18  7:29 PM  Result Value Ref Range   Lipase 33 11 - 51 U/L     Procedures:  Baseline: 140 Variability: moderate Accelerations: present Decelerations: one variable Tocometry: uterine irritability none reported by patient The patient was monitored for 30 minutes, fetal heart rate tracing was deemed reactive, category I tracing,  CPT [id number]   Discharge Condition: good  Disposition: Discharge disposition: 01-Home or Self Care       Diet: Regular diet  Discharge Activity: Activity as tolerated  Discharge Instructions     Discharge activity:  No Restrictions   Complete by: As directed    Discharge diet:  No restrictions   Complete by: As directed    No sexual activity restrictions   Complete by: As directed    Notify physician for a general feeling that "something is not right"   Complete by: As directed    Notify physician for increase or change in vaginal discharge   Complete by: As directed    Notify physician for intestinal cramps, with or without diarrhea, sometimes described as "gas pain"   Complete by: As directed    Notify physician for leaking of fluid   Complete by: As directed    Notify physician for low, dull backache, unrelieved by heat or Tylenol   Complete by: As directed    Notify physician for menstrual like cramps   Complete by: As directed    Notify physician for pelvic pressure   Complete by: As directed    Notify physician for uterine contractions.  These may be painless and feel like the uterus is tightening or the baby is  "balling up"   Complete by: As directed    Notify physician for vaginal bleeding   Complete by: As directed    PRETERM LABOR:  Includes any of the follwing symptoms that occur between 20 - [redacted] weeks gestation.  If these symptoms are not stopped, preterm labor can result in preterm delivery, placing your baby at risk   Complete by: As directed      Allergies as of 09/04/2018      Reactions   Erythromycin Base Other (See Comments)   Stomach cramps   Nickel Rash      Medication List    TAKE these medications   aspirin EC 81 MG tablet Take 1 tablet (81 mg total) by mouth daily. Take after 12 weeks for prevention of preeclampssia later in pregnancy   calcium carbonate 500 MG chewable tablet Commonly known as: TUMS - dosed in mg elemental calcium Chew 1 tablet by mouth as needed for indigestion or heartburn.   CitraNatal Harmony 27-1-260 MG Caps Take 1 tablet by mouth daily.   escitalopram 10 MG tablet Commonly known as: LEXAPRO   famotidine 20 MG  tablet Commonly known as: PEPCID Take 1 tablet (20 mg total) by mouth 2 (two) times daily.   labetalol 100 MG tablet Commonly known as: NORMODYNE Take 1 tablet (100 mg total) by mouth at bedtime.   traZODone 50 MG tablet Commonly known as: DESYREL Take 1 tablet (50 mg total) by mouth at bedtime as needed for sleep.        Total time spent taking care of this patient: 40 minutes  Signed: Malachy Mood 09/04/2018, 8:20 PM

## 2018-09-15 ENCOUNTER — Ambulatory Visit (INDEPENDENT_AMBULATORY_CARE_PROVIDER_SITE_OTHER): Payer: Medicaid Other | Admitting: Obstetrics and Gynecology

## 2018-09-15 ENCOUNTER — Ambulatory Visit (INDEPENDENT_AMBULATORY_CARE_PROVIDER_SITE_OTHER): Payer: Medicaid Other

## 2018-09-15 ENCOUNTER — Other Ambulatory Visit: Payer: Self-pay

## 2018-09-15 ENCOUNTER — Encounter: Payer: Self-pay | Admitting: Obstetrics and Gynecology

## 2018-09-15 ENCOUNTER — Telehealth: Payer: Self-pay | Admitting: Obstetrics and Gynecology

## 2018-09-15 VITALS — BP 130/80 | Wt 183.0 lb

## 2018-09-15 DIAGNOSIS — O10913 Unspecified pre-existing hypertension complicating pregnancy, third trimester: Secondary | ICD-10-CM

## 2018-09-15 DIAGNOSIS — O403XX0 Polyhydramnios, third trimester, not applicable or unspecified: Secondary | ICD-10-CM

## 2018-09-15 DIAGNOSIS — O10919 Unspecified pre-existing hypertension complicating pregnancy, unspecified trimester: Secondary | ICD-10-CM

## 2018-09-15 DIAGNOSIS — Z3A32 32 weeks gestation of pregnancy: Secondary | ICD-10-CM

## 2018-09-15 DIAGNOSIS — O0990 Supervision of high risk pregnancy, unspecified, unspecified trimester: Secondary | ICD-10-CM

## 2018-09-15 NOTE — Progress Notes (Signed)
Routine Prenatal Care Visit  Subjective  Carol Small is a 33 y.o. G3P1011 at [redacted]w[redacted]d being seen today for ongoing prenatal care.  She is currently monitored for the following issues for this high-risk pregnancy and has Hypertension; Major depressive disorder; Supervision of high risk pregnancy, antepartum; Obesity affecting pregnancy, antepartum; Tobacco use affecting pregnancy, antepartum; History of pre-eclampsia in prior pregnancy, currently pregnant; Chronic hypertension affecting pregnancy; Anxiety; and Right upper quadrant abdominal pain affecting pregnancy in third trimester on their problem list.  ----------------------------------------------------------------------------------- Patient reports abdominal pain.   Contractions: Not present. Vag. Bleeding: None.  Movement: Present. Denies leaking of fluid.  ----------------------------------------------------------------------------------- The following portions of the patient's history were reviewed and updated as appropriate: allergies, current medications, past family history, past medical history, past social history, past surgical history and problem list. Problem list updated.   Objective  Blood pressure 130/80, weight 183 lb (83 kg), last menstrual period 01/26/2018. Pregravid weight 171 lb (77.6 kg) Total Weight Gain 12 lb (5.443 kg) Urinalysis:      Fetal Status:     Movement: Present     General:  Alert, oriented and cooperative. Patient is in no acute distress.  Skin: Skin is warm and dry. No rash noted.   Cardiovascular: Normal heart rate noted  Respiratory: Normal respiratory effort, no problems with respiration noted  Abdomen: Soft, gravid, appropriate for gestational age. Pain/Pressure: Present     Pelvic:  Cervical exam deferred        Extremities: Normal range of motion.  Edema: None  Mental Status: Normal mood and affect. Normal behavior. Normal judgment and thought content.   NST: 130 bpm baseline,  moderate variability, 10x10 accelerations, no decelerations. Reactive   Assessment   33 y.o. G3P1011 at [redacted]w[redacted]d by  11/09/2018, by Ultrasound presenting for routine prenatal visit  Plan   pregnancy Problems (from 03/23/18 to present)    Problem Noted Resolved   Chronic hypertension affecting pregnancy 06/09/2018 by Will Bonnet, MD No   Overview Signed 06/09/2018 11:56 AM by Will Bonnet, MD    [ ]  Aspirin 81 mg daily after 12 weeks; discontinue after 36 weeks [ ]  baseline labs with CBC, CMP, urine protein/creatinine ratio [ ]  no BP meds unless BPs become elevated [ ]  ultrasound for growth at 28, 32, 36 weeks [ ]  Aspirin 81 mg daily after 12 weeks; discontinue after 36 weeks [ ]  Baseline EKG   Current antihypertensives:  Labetalol 100 mg bid  Baseline and surveillance labs (pulled in from [facility], refresh links as needed)  Lab Results  Component Value Date   PLT 277 04/12/2018   CREATININE 0.57 04/12/2018   AST 17 04/12/2018   ALT 13 04/12/2018    Antenatal Testing CHTN - O10.919  Group I  BP < 140/90, no preeclampsia, AGA,  nml AFV, +/- meds    Group Small BP > 140/90, on meds, no preeclampsia, AGA, nml AFV  20-28-34-38  20-24-28-32-35-38  32//2 x wk  28//BPP wkly then 32//2 x wk  40 no meds; 39 meds  PRN or 37  Pre-eclampsia  GHTN - O13.9/Preeclampsia without severe features  - O14.00   Preeclampsia with severe features - O14.10  Q 3-4wks  Q 2 wks  28//BPP wkly then 32//2 x wk  Inpatient  37  PRN or 34         Supervision of high risk pregnancy, antepartum 03/23/2018 by Rod Can, CNM No   Overview Addendum 09/03/2018 12:16 PM by Georgianne Fick,  Conan Bowens, [place] Prenatal Labs  Dating  5wk [place] Blood type: AB/Positive/-- (02/04 1650)   Genetic Screen  NIPS: normal xy Antibody:Negative (02/04 1650)  Anatomic [place] complete Rubella: 12.40 (02/04 1650) Varicella: Immune  GTT Early:99 Third trimester: 158 3-hr 83, 162, 120, 113 RPR: Non Reactive  (02/04 1650)   Rhogam  not needed HBsAg: Negative (02/04 1650)   TDaP vaccine   Flu Shot: declines HIV: Non Reactive (02/04 1650)   Baby Food                                GBS:   Contraception  Pap: 2019 NIL  CBB     CS/VBAC 2013- desires repeat   Support Person Carol Small              Gestational age appropriate obstetric precautions including but not limited to vaginal bleeding, contractions, leaking of fluid and fetal movement were reviewed in detail with the patient.    Polyhydramnios- severe, will refer to MFM Start NST once a week Scheduled cesarean section with Staebler for 37 weeks.   Return in about 1 week (around 09/22/2018) for ROB/ AFI/ NST with Staebler if possible.  Homero Fellers MD [facility], [facility] 09/15/2018, 12:31 PM

## 2018-09-15 NOTE — Telephone Encounter (Signed)
-----   Message from Homero Fellers, MD sent at 09/15/2018 12:25 PM EDT ----- I scheduled her for 37 weeks for [facility] on meds, controlled, has severe polyhydramnios at 37cm as well.    Surgery Booking Request Patient Full Name:  Carol Small  MRN: [id number]  DOB: 06-May-1985  Surgeon: Homero Fellers, MD  Requested Surgery Date and Time: 10/20/2018 Primary Diagnosis AND Code: hx LTCS Secondary Diagnosis and Code:  Surgical Procedure: Cesarean Section L&D Notification: Yes Admission Status: admit Length of Surgery: 1 hour Special Case Needs: none H&P: TBD (date) Phone Interview???: yes Interpreter: Language:  Medical Clearance: no Special Scheduling Instructions: none Acuity: P3  ?

## 2018-09-15 NOTE — Progress Notes (Signed)
ROB C/o Pelvic pressure, pain on abdominal sides  Denies lof, no vb, Good FM

## 2018-09-15 NOTE — Telephone Encounter (Signed)
Patient is aware of H&P at [facility] on 10/17/18 @ 8:10am w/ Dr. Georgianne Fick, Pre-admit testing and COVID testing to be scheduled, and OR on 10/20/18.

## 2018-09-16 ENCOUNTER — Telehealth: Payer: Self-pay

## 2018-09-16 NOTE — Telephone Encounter (Signed)
Pt calling; was told to quarantine after getting tested for covid.  Are we taking her out of work or does she continue to work after being tested?  [phone]

## 2018-09-16 NOTE — Telephone Encounter (Signed)
Please inform pt she should not go to work after being tested because she could be exposed in that short time after being testing. Thank you

## 2018-09-16 NOTE — Telephone Encounter (Signed)
Pt aware.

## 2018-09-22 ENCOUNTER — Other Ambulatory Visit: Payer: Self-pay

## 2018-09-22 ENCOUNTER — Other Ambulatory Visit: Payer: Self-pay | Admitting: Maternal & Fetal Medicine

## 2018-09-22 DIAGNOSIS — O403XX0 Polyhydramnios, third trimester, not applicable or unspecified: Secondary | ICD-10-CM

## 2018-09-23 ENCOUNTER — Ambulatory Visit (INDEPENDENT_AMBULATORY_CARE_PROVIDER_SITE_OTHER): Payer: Medicaid Other

## 2018-09-23 ENCOUNTER — Other Ambulatory Visit: Payer: Self-pay

## 2018-09-23 ENCOUNTER — Ambulatory Visit (INDEPENDENT_AMBULATORY_CARE_PROVIDER_SITE_OTHER): Payer: Medicaid Other | Admitting: Obstetrics and Gynecology

## 2018-09-23 VITALS — BP 128/76 | Wt 183.0 lb

## 2018-09-23 DIAGNOSIS — Z3A33 33 weeks gestation of pregnancy: Secondary | ICD-10-CM

## 2018-09-23 DIAGNOSIS — O403XX0 Polyhydramnios, third trimester, not applicable or unspecified: Secondary | ICD-10-CM | POA: Diagnosis not present

## 2018-09-23 DIAGNOSIS — O09299 Supervision of pregnancy with other poor reproductive or obstetric history, unspecified trimester: Secondary | ICD-10-CM

## 2018-09-23 DIAGNOSIS — O99213 Obesity complicating pregnancy, third trimester: Secondary | ICD-10-CM

## 2018-09-23 DIAGNOSIS — O10913 Unspecified pre-existing hypertension complicating pregnancy, third trimester: Secondary | ICD-10-CM

## 2018-09-23 DIAGNOSIS — O99210 Obesity complicating pregnancy, unspecified trimester: Secondary | ICD-10-CM

## 2018-09-23 DIAGNOSIS — O0993 Supervision of high risk pregnancy, unspecified, third trimester: Secondary | ICD-10-CM

## 2018-09-23 DIAGNOSIS — O0990 Supervision of high risk pregnancy, unspecified, unspecified trimester: Secondary | ICD-10-CM

## 2018-09-23 DIAGNOSIS — O34219 Maternal care for unspecified type scar from previous cesarean delivery: Secondary | ICD-10-CM

## 2018-09-23 DIAGNOSIS — O09293 Supervision of pregnancy with other poor reproductive or obstetric history, third trimester: Secondary | ICD-10-CM

## 2018-09-23 DIAGNOSIS — O10919 Unspecified pre-existing hypertension complicating pregnancy, unspecified trimester: Secondary | ICD-10-CM

## 2018-09-23 NOTE — Patient Instructions (Signed)
Polyhydramnios is the increase in amniotic fluid volume around the fetus describes as single deepest vertical pocket (SDP) of 8cm or amniotic fluid index (AFI) of >24.0cm, and affects approximately 1-2% of singleton pregnancies.  Previous research studies have used an amniotic fluid index cut of of >25cm, however 24.0cm represents the 97.5%ile at all gestational ages.  Polyhydramnios is further subclassified as mild 24.0-29.9cm or SDP 8-11cm comprising 65-70% of cases, moderate AFI 30.0-34.9cm or SDP 12-15cm comprising 20% of cases, and severe AFI >35cm or SDP >16cm comprising <15% of cases.  The majority of cases (60-70%) will be idiopathic, with the 2 most common pathologic causes attributable to maternal diabetes or fetal anomalies.  Idiopathic polyhdramnios is associated with an increased risk of macrosomia, with birth weight >4000g in 15-30% of cases as opposed to 8% in the general population.  The constellation of polyhydramnios and intrauterine growth restriction raises concern for trisomy 13 or 18.  Amnioreduction should be limited to patient with severe maternal discomfort, dyspnea, or both.  The use of indomethacin purely for the reduction of amniotic fluid volume is not recommended.  Isolated mild idiopathic polyhydramnios does not require additional antepartum surveillance, and delivery is not recommended prior to 39 weeks.  Cases of severe polyhydramnios should consider delivery at a tertiary care center given the high risk of associated fetal anatomic anomalies (20-40%).    [facility] Consult Series #46: Evaluation and management of polyhydramnios October 2018any additional antepartum fetal surveillance

## 2018-09-23 NOTE — Progress Notes (Signed)
ROB NST/AFI today/Appointment with MFM 7/20

## 2018-09-23 NOTE — Progress Notes (Signed)
Routine Prenatal Care Visit  Subjective  Carol Small is a 33 y.o. G3P1011 at [redacted]w[redacted]d being seen today for ongoing prenatal care.  She is currently monitored for the following issues for this high-risk pregnancy and has Hypertension; Major depressive disorder; Supervision of high risk pregnancy, antepartum; Obesity affecting pregnancy, antepartum; Tobacco use affecting pregnancy, antepartum; History of pre-eclampsia in prior pregnancy, currently pregnant; Chronic hypertension affecting pregnancy; Anxiety; Right upper quadrant abdominal pain affecting pregnancy in third trimester; Polyhydramnios in third trimester; and History of cesarean section complicating pregnancy on their problem list.  ----------------------------------------------------------------------------------- Patient reports pressure.   Contractions: Not present. Vag. Bleeding: None.  Movement: Present. Denies leaking of fluid.  ----------------------------------------------------------------------------------- The following portions of the patient's history were reviewed and updated as appropriate: allergies, current medications, past family history, past medical history, past social history, past surgical history and problem list. Problem list updated.   Objective  Last menstrual period 01/26/2018. Pregravid weight 171 lb (77.6 kg) Total Weight Gain 12 lb (5.443 kg) Urinalysis:      Fetal Status: Fetal Heart Rate (bpm): 140   Movement: Present     General:  Alert, oriented and cooperative. Patient is in no acute distress.  Skin: Skin is warm and dry. No rash noted.   Cardiovascular: Normal heart rate noted  Respiratory: Normal respiratory effort, no problems with respiration noted  Abdomen: Soft, gravid, appropriate for gestational age. Pain/Pressure: Present     Pelvic:  Cervical exam deferred        Extremities: Normal range of motion.     ental Status: Normal mood and affect. Normal behavior. Normal judgment and  thought content.   US Ob Detail + 14 Wk  Result Date: 09/23/2018 Patient Name: Carol Small DOB: 09/25/1985 MRN: [id number] ULTRASOUND REPORT Location: [facility] Date of Service: 09/23/2018 Indications:AFI Findings: Nelda [place]se[place] intrauterine pregnancy is visualized with FHR at 152 BPM. Fetal presentation is Cephalic. Placenta: posterior. Grade: 1 AFI: 39.0 cm Impression: 1. [redacted]w[redacted]d Viable Singleton Intrauterine pregnancy dated by previously established criteria. 2. AFI is 39.0 cm. Recommendations: 1.Clinical correlation with the patient's History and Physical Exam. Gweneth Dimitri, RT There is a singleton gestation with severe polyhdramnios. Polyhydramnios is the increase in amniotic fluid volume around the fetus describes as single deepest vertical pocket (SDP) of 8cm or amniotic fluid index (AFI) of >24.0cm, and affects approximately 1-2% of singleton pregnancies.  Previous research studies have used an amniotic fluid index cut of of >25cm, however 24.0cm represents the 97.5%ile at all gestational ages.  Polyhydramnios is further subclassified as mild 24.0-29.9cm or SDP 8-11cm comprising 65-70% of cases, moderate AFI 30.0-34.9cm or SDP 12-15cm comprising 20% of cases, and severe AFI >35cm or SDP >16cm comprising <15% of cases.  The majority of cases (60-70%) will be idiopathic, with the 2 most common pathologic causes attributable to maternal diabetes or fetal anomalies.  Idiopathic polyhdramnios is associated with an increased risk of macrosomia, with birth weight >4000g in 15-30% of cases as opposed to 8% in the general population.  The constellation of polyhydramnios and intrauterine growth restriction raises concern for trisomy 13 or 18.  Amnioreduction should be limited to patient with severe maternal discomfort, dyspnea, or both.  The use of indomethacin purely for the reduction of amniotic fluid volume is not recommended.  Isolated mild idiopathic polyhydramnios does not require additional  antepartum surveillance, and delivery is not recommended prior to 39 weeks.  Cases of severe polyhydramnios should consider delivery at a tertiary care  center given the high risk of associated fetal anatomic anomalies (20-40%).  [facility] Consult Series #46: Evaluation and management of polyhydramnios October 2018any additional antepartum fetal surveillance The patient has had negative NIPT testing, elevated 1-hr OGTT of 158 with normal 3-hr OGTT  83, 162, 120, 113. The visualized fetal anatomical survey appears within normal limits within the resolution of ultrasound as described above.  In particular the stomach appears appropriately distended, no evidence of double bubble sign or other GI tract obstruction.  It must be noted that a normal ultrasound is unable to rule out fetal aneuploidy.  Malachy Mood, MD, [facility], [facility] 09/23/2018, 9:24 PM   US Ob Follow Up  Result Date: 09/15/2018 Patient Name: Carol Small DOB: 28-Sep-1985 MRN: [id number] ULTRASOUND REPORT Location: [facility] Date of Service: 09/15/2018 Indications:growth/afi Findings: Nelda [place]se[place] intrauterine pregnancy is visualized with FHR at 141 BPM. Biometrics give an (U/S) Gestational age of [redacted]w[redacted]d and an (U/S) EDD of 11/11/2018; this correlates with the clinically established Estimated Date of Delivery: 11/09/18. Fetal presentation is Cephalic. Placenta: posterior. Grade: 2 AFI: 37.2 cm Growth percentile is 40.7. EFW: 1836 g ( 4 lb 1 oz ) Impression: 1. [redacted]w[redacted]d Viable Singleton Intrauterine pregnancy previously established criteria. 2. Growth is 40.7 %ile.  3. Polyhydramnios- AFI is 37.2 cm. Recommendations: 1.Clinical correlation with the patient's History and Physical Exam. Gweneth Dimitri, RT I have reviewed this ultrasound and the report. I agree with the above assessment and plan. [facility] 09/15/18 12:01 PM    Baseline: 140 Variability:  moderate Accelerations: present Decelerations: absent The patient was monitored for 30 minutes, fetal heart rate tracing was deemed reactive, category I tracing,   Assessment   33 y.o. G3P1011 at [redacted]w[redacted]d by  11/09/2018, by Ultrasound presenting for routine prenatal visit  Plan   pregnancy Problems (from 03/23/18 to present)    Problem Noted Resolved   Polyhydramnios in third trimester 09/23/2018 by Malachy Mood, MD No   History of cesarean section complicating pregnancy 06/15/8117 by Malachy Mood, MD No   Chronic hypertension affecting pregnancy 06/09/2018 by Will Bonnet, MD No   Overview Signed 06/09/2018 11:56 AM by Will Bonnet, MD    [ ]  Aspirin 81 mg daily after 12 weeks; discontinue after 36 weeks [ ]  baseline labs with CBC, CMP, urine protein/creatinine ratio [ ]  no BP meds unless BPs become elevated [ ]  ultrasound for growth at 28, 32, 36 weeks [ ]  Aspirin 81 mg daily after 12 weeks; discontinue after 36 weeks [ ]  Baseline EKG   Current antihypertensives:  Labetalol 100 mg bid  Baseline and surveillance labs (pulled in from [facility], refresh links as needed)  Lab Results  Component Value Date   PLT 277 04/12/2018   CREATININE 0.57 04/12/2018   AST 17 04/12/2018   ALT 13 04/12/2018    Antenatal Testing CHTN - O10.919  Group I  BP < 140/90, no preeclampsia, AGA,  nml AFV, +/- meds    Group Small BP > 140/90, on meds, no preeclampsia, AGA, nml AFV  20-28-34-38  20-24-28-32-35-38  32//2 x wk  28//BPP wkly then 32//2 x wk  40 no meds; 39 meds  PRN or 37  Pre-eclampsia  GHTN - O13.9/Preeclampsia without severe features  - O14.00   Preeclampsia with severe features - O14.10  Q 3-4wks  Q 2 wks  28//BPP wkly then 32//2 x wk  Inpatient  37  PRN or 34  Supervision of high risk pregnancy, antepartum 03/23/2018 by Rod Can, CNM No   Overview Addendum 09/03/2018 12:16 PM by Malachy Mood, MD    [facility] Prenatal Labs  Dating   5wk [place] Blood type: AB/Positive/-- (02/04 1650)   Genetic Screen  NIPS: normal xy Antibody:Negative (02/04 1650)  Anatomic [place] complete Rubella: 12.40 (02/04 1650) Varicella: Immune  GTT Early:99 Third trimester: 158 3-hr 83, 162, 120, 113 RPR: Non Reactive (02/04 1650)   Rhogam  not needed HBsAg: Negative (02/04 1650)   TDaP vaccine   Flu Shot: declines HIV: Non Reactive (02/04 1650)   Baby Food                                GBS:   Contraception  Pap: 2019 NIL  CBB     CS/VBAC 2013- desires repeat   Support Person Donyell              Gestational age appropriate obstetric precautions including but not limited to vaginal bleeding, contractions, leaking of fluid and fetal movement were reviewed in detail with the patient.    Polyhydramnios is the increase in amniotic fluid volume around the fetus describes as single deepest vertical pocket (SDP) of 8cm or amniotic fluid index (AFI) of >24.0cm, and affects approximately 1-2% of singleton pregnancies.  Previous research studies have used an amniotic fluid index cut of of >25cm, however 24.0cm represents the 97.5%ile at all gestational ages.  Polyhydramnios is further subclassified as mild 24.0-29.9cm or SDP 8-11cm comprising 65-70% of cases, moderate AFI 30.0-34.9cm or SDP 12-15cm comprising 20% of cases, and severe AFI >35cm or SDP >16cm comprising <15% of cases.  The majority of cases (60-70%) will be idiopathic, with the 2 most common pathologic causes attributable to maternal diabetes or fetal anomalies.  Idiopathic polyhdramnios is associated with an increased risk of macrosomia, with birth weight >4000g in 15-30% of cases as opposed to 8% in the general population.  The constellation of polyhydramnios and intrauterine growth restriction raises concern for trisomy 13 or 18.  Amnioreduction should be limited to patient with severe maternal discomfort, dyspnea, or both.  The use of indomethacin purely for the reduction of amniotic fluid volume is  not recommended.  Isolated mild idiopathic polyhydramnios does not require additional antepartum surveillance, and delivery is not recommended prior to 39 weeks.  Cases of severe polyhydramnios should consider delivery at a tertiary care center given the high risk of associated fetal anatomic anomalies (20-40%).    [facility] Consult Series #46: Evaluation and management of polyhydramnios October 2018any additional antepartum fetal surveillance  - Has MFM follow up 09/26/2018 - C-section currently scheduled [facility] 10/27/2018 at [facility] unless MFM feels needs to delivery at tertiary care center  Return in about 1 week (around 09/30/2018) for ROB, NST, AFI.  Malachy Mood, MD, [facility], [place]

## 2018-09-25 ENCOUNTER — Observation Stay
Admission: EM | Admit: 2018-09-25 | Discharge: 2018-09-25 | Disposition: A | Discharge status: Home / Self Care | Payer: Medicaid Other | Attending: Certified Nurse Midwife | Admitting: Certified Nurse Midwife

## 2018-09-25 ENCOUNTER — Other Ambulatory Visit: Payer: Self-pay

## 2018-09-25 DIAGNOSIS — O26893 Other specified pregnancy related conditions, third trimester: Principal | ICD-10-CM | POA: Diagnosis present

## 2018-09-25 DIAGNOSIS — Z888 Allergy status to other drugs, medicaments and biological substances status: Secondary | ICD-10-CM | POA: Diagnosis not present

## 2018-09-25 DIAGNOSIS — Z79899 Other long term (current) drug therapy: Secondary | ICD-10-CM | POA: Diagnosis not present

## 2018-09-25 DIAGNOSIS — O10919 Unspecified pre-existing hypertension complicating pregnancy, unspecified trimester: Secondary | ICD-10-CM

## 2018-09-25 DIAGNOSIS — O99333 Smoking (tobacco) complicating pregnancy, third trimester: Secondary | ICD-10-CM | POA: Diagnosis not present

## 2018-09-25 DIAGNOSIS — O403XX0 Polyhydramnios, third trimester, not applicable or unspecified: Secondary | ICD-10-CM | POA: Diagnosis not present

## 2018-09-25 DIAGNOSIS — O34219 Maternal care for unspecified type scar from previous cesarean delivery: Secondary | ICD-10-CM

## 2018-09-25 DIAGNOSIS — F418 Other specified anxiety disorders: Secondary | ICD-10-CM | POA: Insufficient documentation

## 2018-09-25 DIAGNOSIS — Z3A33 33 weeks gestation of pregnancy: Secondary | ICD-10-CM | POA: Insufficient documentation

## 2018-09-25 DIAGNOSIS — F329 Major depressive disorder, single episode, unspecified: Secondary | ICD-10-CM | POA: Insufficient documentation

## 2018-09-25 DIAGNOSIS — R109 Unspecified abdominal pain: Secondary | ICD-10-CM | POA: Diagnosis present

## 2018-09-25 DIAGNOSIS — F17210 Nicotine dependence, cigarettes, uncomplicated: Secondary | ICD-10-CM | POA: Insufficient documentation

## 2018-09-25 DIAGNOSIS — O10913 Unspecified pre-existing hypertension complicating pregnancy, third trimester: Secondary | ICD-10-CM | POA: Insufficient documentation

## 2018-09-25 DIAGNOSIS — O0990 Supervision of high risk pregnancy, unspecified, unspecified trimester: Secondary | ICD-10-CM

## 2018-09-25 DIAGNOSIS — Z7982 Long term (current) use of aspirin: Secondary | ICD-10-CM | POA: Insufficient documentation

## 2018-09-25 DIAGNOSIS — O99343 Other mental disorders complicating pregnancy, third trimester: Secondary | ICD-10-CM | POA: Insufficient documentation

## 2018-09-25 LAB — URINALYSIS, COMPLETE (UACMP) WITH MICROSCOPIC
Bacteria, UA: NONE SEEN
Bilirubin Urine: NEGATIVE
Glucose, UA: NEGATIVE mg/dL
Hgb urine dipstick: NEGATIVE
Ketones, ur: 5 mg/dL — AB
Nitrite: NEGATIVE
Protein, ur: 30 mg/dL — AB
Specific Gravity, Urine: 1.029 (ref 1.005–1.030)
pH: 6 (ref 5.0–8.0)

## 2018-09-25 NOTE — Final Progress Note (Signed)
Physician Final Progress Note  Patient ID: Carol Small MRN: [id number] DOB/AGE: 03/15/1985 33 y.o.  Admit date: 09/25/2018 Admitting provider: Will Bonnet, MD/ Jesus Genera. Danise Mina, CNM Discharge date: 09/25/2018   Admission Diagnoses: IUP at 33wk4d with abdominal pain Polyhydramnios  Discharge Diagnoses:   Consults: None  Significant Findings/ Diagnostic Studies:  HPI:  Carol Small is a 33 y.o. G81P1011 female with EDC=11/09/2018 at [redacted]w[redacted]d dated by a 5wk5d ultrasound.  Her pregnancy has been complicated by chronic hypertension, depression/anxiety, a history of previous Cesarean section, and polyhydramonios.Her last AFI on 7/17 was 39 cm. EFW 7/9 was 40.7% or 4#1oz (1836gm).  She presents to L&D for evaluation of abdominal pain since 0100 It began a few hours after eating at [employer]. The pain was a constant, stabbing pain with intermittent cramping and located mid abdomen. She occasionally has a pain on the left side. One episode of diarrhea occurred at 0300. It was watery and brown. The pain made her nauseous, but she is no longer nauseous. She rated the pain 7/10 prior to arrival. She states that the pain has subsided since arriving at hospital.  No bleeding or LOF. Positive fetal movement. She is schedued for an appointment on Monday at [facility] for follow up on polyhydramnios. Is currently scheduled for repeat Cesarean section on 13 August.    Prenatal care site: Prenatal care at [facility] has also been remarkable for  [facility] Prenatal Labs  Dating  5wk [place] Blood type: AB/Positive/-- (02/04 1650)   Genetic Screen  NIPS: normal xy Antibody:Negative (02/04 1650)  Anatomic [place] complete Rubella: 12.40 (02/04 1650) Varicella: Immune  GTT Early:99 Third trimester: 158 3-hr 83, 162, 120, 113 RPR: Non Reactive (02/04 1650)   Rhogam  not needed HBsAg: Negative (02/04 1650)   TDaP vaccine   Flu Shot: declines HIV: Non Reactive (02/04 1650)    Baby Food                                GBS:   Contraception  Pap: 2019 NIL  CBB     CS/VBAC 2013- desires repeat   Support Person Donyell         Maternal Medical History:   Past Medical History:  Diagnosis Date  . Anxiety   . GERD (gastroesophageal reflux disease)   . Hypertension    NO MEDS-NEVER FOLLOWED BACK UP WITH PCP  . S/P LEEP of cervix     Past Surgical History:  Procedure Laterality Date  . BREAST LUMPECTOMY Left 07/01/2015   Procedure: BREAST LUMPECTOMY;  Surgeon: Christene Lye, MD;  Location: [facility] ORS;  Service: General;  Laterality: Left;  . CESAREAN SECTION  05/11/2011   fetal intolerance  . FOOT SURGERY      Allergies  Allergen Reactions  . Erythromycin Base Other (See Comments)    Stomach cramps  . Adhesive [Tape] Other (See Comments)    Skin becomes raw  . Nickel Rash    Prior to Admission medications   Medication Sig Start Date End Date Taking? Authorizing Provider  aspirin EC 81 MG tablet Take 1 tablet (81 mg total) by mouth daily. Take after 12 weeks for prevention of preeclampssia later in pregnancy 05/10/18  Yes Rexene Agent, CNM  calcium carbonate (TUMS - DOSED IN MG ELEMENTAL CALCIUM) 500 MG chewable tablet Chew 1 tablet by mouth as needed for indigestion or heartburn.  Yes [provider]  escitalopram (LEXAPRO) 10 MG tablet  02/18/18  Yes [provider]  famotidine (PEPCID) 20 MG tablet Take 1 tablet (20 mg total) by mouth 2 (two) times daily. 07/07/18  Yes Rexene Agent, CNM  labetalol (NORMODYNE) 100 MG tablet Take 1 tablet (100 mg total) by mouth at bedtime. 08/05/18  Yes Dalia Heading, CNM  Prenat-FeFmCb-DSS-FA-DHA w/o A (CITRANATAL HARMONY) 27-1-260 MG CAPS Take 1 tablet by mouth daily. 03/30/18  Yes Gae Dry, MD  traZODone (DESYREL) 50 MG tablet Take 1 tablet (50 mg total) by mouth at bedtime as needed for sleep. 09/04/18  Yes Malachy Mood, MD          Social History: She   reports that she has been smoking cigarettes. She has a 6.50 pack-year smoking history. She has never used smokeless tobacco. She reports previous alcohol use. She reports that she does not use drugs.  Family History: family history includes Thyroid cancer in her maternal grandmother.   Review of Systems: Negative x 10 systems reviewed except as noted in the HPI.      Physical Exam:  Vital Signs: BP 126/81 (BP Location: Right Arm)   Pulse 97   Temp 98.1 F (36.7 C) (Oral)   Resp 18   Ht 5\' 2"  (1.575 m)   Wt 83 kg   LMP 01/26/2018 (Exact Date)   BMI 33.47 kg/m  General: gravid BF in no acute distress.  HEENT: normocephalic, atraumatic Heart: regular rate & rhythm.  No murmurs/rubs/gallops Lungs: normal respiratory effort Abdomen: soft, gravid, mild tenderness on left side of uterus. No guarding  Pelvic:   External: Normal external female genitalia  Cervix: Dilation: Closed / Effacement (%): Thick / Station: Ballotable   Extremities: non-tender, symmetric, trace edema bilaterally.   Neurologic: Alert & oriented x 3.   Baseline FHR: initially baseline 130s with accelerations to 150s to 170, moderate variability. Baseline change to 150 with increased fetal activity. Toco: irregular, mild contractions-does not seem to be related to pain she presented with. There was on contraction in the last 30 minutes.  Results for orders placed or performed during the hospital encounter of 09/25/18 (from the past 24 hour(s))  Urinalysis, Complete w Microscopic     Status: Abnormal   Collection Time: 09/25/18  4:52 AM  Result Value Ref Range   Color, Urine YELLOW (A) YELLOW   APPearance CLOUDY (A) CLEAR   Specific Gravity, Urine 1.029 1.005 - 1.030   pH 6.0 5.0 - 8.0   Glucose, UA NEGATIVE NEGATIVE mg/dL   Hgb urine dipstick NEGATIVE NEGATIVE   Bilirubin Urine NEGATIVE NEGATIVE   Ketones, ur 5 (A) NEGATIVE mg/dL   Protein, ur 30 (A) NEGATIVE mg/dL   Nitrite NEGATIVE NEGATIVE    Leukocytes,Ua TRACE (A) NEGATIVE   RBC / HPF 0-5 0 - 5 RBC/hpf   WBC, UA 6-10 0 - 5 WBC/hpf   Bacteria, UA NONE SEEN NONE SEEN   Squamous Epithelial / LPF 11-20 0 - 5   Mucus PRESENT    Ca Oxalate Crys, UA PRESENT      Assessment:  Carol Small is a 33 y.o. G48P1011 female at [redacted]w[redacted]d with sharp stabbing abdominal pain-now resolved. Some preterm contractions and cramping probably due to polyhydramnios-no cervical change FWB: Cat 1 tracing  Procedures: none  Discharge Condition: stable  Disposition: Discharge disposition: 01-Home or Self Care       Diet: Regular diet  Discharge Activity: Activity as tolerated  Discharge  Instructions    Discharge patient   Complete by: As directed    Discharge disposition: 01-Home or Self Care   Discharge patient date: 09/25/2018     Allergies as of 09/25/2018      Reactions   Erythromycin Base Other (See Comments)   Stomach cramps   Adhesive [tape] Other (See Comments)   Skin becomes raw   Nickel Rash      Medication List    TAKE these medications   aspirin EC 81 MG tablet Take 1 tablet (81 mg total) by mouth daily. Take after 12 weeks for prevention of preeclampssia later in pregnancy   calcium carbonate 500 MG chewable tablet Commonly known as: TUMS - dosed in mg elemental calcium Chew 1 tablet by mouth as needed for indigestion or heartburn.   CitraNatal Harmony 27-1-260 MG Caps Take 1 tablet by mouth daily.   escitalopram 10 MG tablet Commonly known as: LEXAPRO Take 10 mg by mouth daily.   famotidine 20 MG tablet Commonly known as: PEPCID Take 1 tablet (20 mg total) by mouth 2 (two) times daily.   labetalol 100 MG tablet Commonly known as: NORMODYNE Take 1 tablet (100 mg total) by mouth at bedtime.   traZODone 50 MG tablet Commonly known as: DESYREL Take 1 tablet (50 mg total) by mouth at bedtime as needed for sleep.        Total time spent taking care of this patient: 20 minutes  Signed: Dalia Heading 09/25/2018, 11:37 AM

## 2018-09-25 NOTE — OB Triage Note (Signed)
Pt. Presented to L/D triage with reported abdominal pain since 0100. It began a few hours after eating at a restaurant. The pain is a constant, stabbing pain with intermittent cramping. One episode of diarrhea occurred at 0300. It is watery and brown. She rates the pain 7/10. No bleeding or LOF. Positive fetal movement. She is schedued for an appointment on Monday for reported polyhydramnios. VSS. Will continue to monitor.

## 2018-09-26 ENCOUNTER — Ambulatory Visit
Admission: RE | Admit: 2018-09-26 | Discharge: 2018-09-26 | Disposition: A | Discharge status: Home / Self Care | Payer: Medicaid Other | Source: Ambulatory Visit | Attending: Maternal & Fetal Medicine | Admitting: Maternal & Fetal Medicine

## 2018-09-26 DIAGNOSIS — O34219 Maternal care for unspecified type scar from previous cesarean delivery: Secondary | ICD-10-CM

## 2018-09-26 DIAGNOSIS — O403XX0 Polyhydramnios, third trimester, not applicable or unspecified: Secondary | ICD-10-CM

## 2018-09-26 DIAGNOSIS — Z3A34 34 weeks gestation of pregnancy: Secondary | ICD-10-CM | POA: Insufficient documentation

## 2018-09-26 DIAGNOSIS — O0990 Supervision of high risk pregnancy, unspecified, unspecified trimester: Secondary | ICD-10-CM

## 2018-09-26 DIAGNOSIS — O10919 Unspecified pre-existing hypertension complicating pregnancy, unspecified trimester: Secondary | ICD-10-CM

## 2018-09-26 LAB — URINE CULTURE
Culture: NO GROWTH
Special Requests: NORMAL

## 2018-09-26 LAB — GLUCOSE, CAPILLARY: Glucose-Capillary: 97 mg/dL (ref 70–99)

## 2018-09-28 ENCOUNTER — Telehealth: Payer: Self-pay | Admitting: Obstetrics and Gynecology

## 2018-09-28 NOTE — Telephone Encounter (Signed)
I contacted the patient to move the C/S. Patient said she doesn't want to do the C/S later due to the fluid, that no one is able to tell her where the fluid is from, and doesn't want problems for her baby. She says it's her body and she wants to discuss. Carol Small she was seen in the ER on Saturday for contractions, which started again as soon as she left but she was too weak and tired to go back. She says she is hurting all of the time. She said she had a c/s with her first child, was told heartrate was dropping but it turned out to be the machine at [facility], that she had a c/s for nothing. Patient is very concerned and doesn't want anything to happen to this baby.

## 2018-09-28 NOTE — Telephone Encounter (Signed)
-----   Message from Malachy Mood, MD sent at 09/23/2018 11:48 AM EDT ----- Regarding: Surgery REschedule Surgery Date: 10/27/2018  LOS: surgery admit  Surgery Booking Request Patient Full Name: Carol Small MRN: [id number]  DOB: [birth year]  Surgeon: Gardiner Rhyme Requested Surgery Date and Time:  Primary Diagnosis and Code: History of cesarean section 034.219 Secondary Diagnosis and Code: Hypertension 010.919 and polyhydramnios O40.3XX0 Surgical Procedure: Cesarean Section L&D Notification:yes Admission Status: surgery admit Length of Surgery: 1h Special Case Needs: none H&P: week off (date) Phone Interview or Office Pre-Admit: pre-admit Interpreter: No Language: English Medical Clearance: No Special Scheduling Instructions: move from 8/13 to 8/20

## 2018-09-30 ENCOUNTER — Ambulatory Visit (INDEPENDENT_AMBULATORY_CARE_PROVIDER_SITE_OTHER): Payer: Medicaid Other

## 2018-09-30 ENCOUNTER — Other Ambulatory Visit: Payer: Self-pay

## 2018-09-30 ENCOUNTER — Ambulatory Visit (INDEPENDENT_AMBULATORY_CARE_PROVIDER_SITE_OTHER): Payer: Medicaid Other | Admitting: Advanced Practice Midwife

## 2018-09-30 ENCOUNTER — Encounter: Payer: Self-pay | Admitting: Advanced Practice Midwife

## 2018-09-30 VITALS — BP 142/62 | Wt 181.0 lb

## 2018-09-30 DIAGNOSIS — Z3A34 34 weeks gestation of pregnancy: Secondary | ICD-10-CM

## 2018-09-30 DIAGNOSIS — O10919 Unspecified pre-existing hypertension complicating pregnancy, unspecified trimester: Secondary | ICD-10-CM

## 2018-09-30 DIAGNOSIS — O403XX0 Polyhydramnios, third trimester, not applicable or unspecified: Secondary | ICD-10-CM

## 2018-09-30 DIAGNOSIS — O0990 Supervision of high risk pregnancy, unspecified, unspecified trimester: Secondary | ICD-10-CM

## 2018-09-30 DIAGNOSIS — O34219 Maternal care for unspecified type scar from previous cesarean delivery: Secondary | ICD-10-CM

## 2018-09-30 DIAGNOSIS — O10913 Unspecified pre-existing hypertension complicating pregnancy, third trimester: Secondary | ICD-10-CM

## 2018-09-30 DIAGNOSIS — O99210 Obesity complicating pregnancy, unspecified trimester: Secondary | ICD-10-CM

## 2018-09-30 DIAGNOSIS — O09299 Supervision of pregnancy with other poor reproductive or obstetric history, unspecified trimester: Secondary | ICD-10-CM

## 2018-09-30 DIAGNOSIS — O0993 Supervision of high risk pregnancy, unspecified, third trimester: Secondary | ICD-10-CM

## 2018-09-30 DIAGNOSIS — O09293 Supervision of pregnancy with other poor reproductive or obstetric history, third trimester: Secondary | ICD-10-CM

## 2018-09-30 LAB — POCT URINALYSIS DIPSTICK OB: Glucose, UA: NEGATIVE

## 2018-09-30 NOTE — Progress Notes (Signed)
Routine Prenatal Care Visit  Subjective  Carol Small is a 33 y.o. G3P1011 at [redacted]w[redacted]d being seen today for ongoing prenatal care.  She is currently monitored for the following issues for this high-risk pregnancy and has Hypertension; Major depressive disorder; Supervision of high risk pregnancy, antepartum; Obesity affecting pregnancy, antepartum; Tobacco use affecting pregnancy, antepartum; History of pre-eclampsia in prior pregnancy, currently pregnant; Chronic hypertension affecting pregnancy; Anxiety; Right upper quadrant abdominal pain affecting pregnancy in third trimester; Polyhydramnios in third trimester; History of cesarean section complicating pregnancy; and Abdominal pain during pregnancy in third trimester on their problem list.  ----------------------------------------------------------------------------------- Patient reports feeling well. She has good fetal movement. She prefers to have her c/section on the 13th instead of the 20th. Unable to resolve scheduling with Izora Gala during the visit, but later Izora Gala said the patient is scheduled for the 13th and she will call and let her know.  She is taking her Labetalol as prescribed. She had a headache yesterday that was relieved by tylenol. She denies visual disturbances or epigastric pain. She was seen on L&D recently for frequent contractions. No cervical dilation.   Contractions: Irregular. Vag. Bleeding: None.  Movement: Present. Denies leaking of fluid.  ----------------------------------------------------------------------------------- The following portions of the patient's history were reviewed and updated as appropriate: allergies, current medications, past family history, past medical history, past social history, past surgical history and problem list. Problem list updated.   Objective  Blood pressure (!) 142/62, weight 181 lb (82.1 kg), last menstrual period 01/26/2018. Pregravid weight 171 lb (77.6 kg) Total Weight Gain 10  lb (4.536 kg) Urinalysis: Urine Protein Small (1+)  Urine Glucose Negative  Fetal Status: Fetal Heart Rate (bpm): 140   Movement: Present     General:  Alert, oriented and cooperative. Patient is in no acute distress.  Skin: Skin is warm and dry. No rash noted.   Cardiovascular: Normal heart rate noted  Respiratory: Normal respiratory effort, no problems with respiration noted  Abdomen: Soft, gravid, appropriate for gestational age. Pain/Pressure: Present     Pelvic:  Cervical exam deferred        Extremities: Normal range of motion.  Edema: None  Mental Status: Normal mood and affect. Normal behavior. Normal judgment and thought content.   Assessment   33 y.o. G3P1011 at [redacted]w[redacted]d by  11/09/2018, by Ultrasound presenting for routine prenatal visit  Plan   pregnancy Problems (from 03/23/18 to present)    Problem Noted Resolved   Polyhydramnios in third trimester 09/23/2018 by Malachy Mood, MD No   History of cesarean section complicating pregnancy 7/84/6962 by Malachy Mood, MD No   Chronic hypertension affecting pregnancy 06/09/2018 by Will Bonnet, MD No   Overview Signed 06/09/2018 11:56 AM by Will Bonnet, MD    [ ]  Aspirin 81 mg daily after 12 weeks; discontinue after 36 weeks [ ]  baseline labs with CBC, CMP, urine protein/creatinine ratio [ ]  no BP meds unless BPs become elevated [ ]  ultrasound for growth at 28, 32, 36 weeks [ ]  Aspirin 81 mg daily after 12 weeks; discontinue after 36 weeks [ ]  Baseline EKG   Current antihypertensives:  Labetalol 100 mg bid  Baseline and surveillance labs (pulled in from [facility], refresh links as needed)  Lab Results  Component Value Date   PLT 277 04/12/2018   CREATININE 0.57 04/12/2018   AST 17 04/12/2018   ALT 13 04/12/2018    Antenatal Testing CHTN - O10.919  Group I  BP < 140/90, no  preeclampsia, AGA,  nml AFV, +/- meds    Group Small BP > 140/90, on meds, no preeclampsia, AGA, nml AFV  20-28-34-38  20-24-28-32-35-38   32//2 x wk  28//BPP wkly then 32//2 x wk  40 no meds; 39 meds  PRN or 37  Pre-eclampsia  GHTN - O13.9/Preeclampsia without severe features  - O14.00   Preeclampsia with severe features - O14.10  Q 3-4wks  Q 2 wks  28//BPP wkly then 32//2 x wk  Inpatient  37  PRN or 34         Supervision of high risk pregnancy, antepartum 03/23/2018 by Rod Can, CNM No   Overview Addendum 09/25/2018  4:41 AM by Dalia Heading, [place] Prenatal Labs  Dating  5wk [place] Blood type: AB/Positive/-- (02/04 1650)   Genetic Screen  NIPS: normal xy Antibody:Negative (02/04 1650)  Anatomic [place] complete Rubella: 12.40 (02/04 1650) Varicella: Immune  GTT Early:99 Third trimester: 158 3-hr 83, 162, 120, 113 RPR: Non Reactive (02/04 1650)   Rhogam  not needed HBsAg: Negative (02/04 1650)   TDaP vaccine   Flu Shot: declines HIV: Non Reactive (02/04 1650)   Baby Food                                GBS:   Contraception  Pap: 2019 NIL  CBB     CS/VBAC 2013- desires repeat   Support Person Donyell           Bruning labs today    Preterm labor symptoms and general obstetric precautions including but not limited to vaginal bleeding, contractions, leaking of fluid and fetal movement were reviewed in detail with the patient. Please refer to After Visit Summary for other counseling recommendations.   Increase hydration til urine is clear to light yellow  Return in about 1 week (around 10/07/2018) for afi/nst/rob with AMS.  Rod Can, CNM 09/30/2018 3:41 PM

## 2018-09-30 NOTE — Progress Notes (Signed)
ROB AFI/NST 

## 2018-09-30 NOTE — Telephone Encounter (Signed)
No answer, no v/m

## 2018-09-30 NOTE — Telephone Encounter (Signed)
Patient returned call, spoke to Clarise Cruz, who let her know the c/s is still on 10/20/18.

## 2018-09-30 NOTE — Patient Instructions (Signed)

## 2018-10-01 LAB — CBC WITH DIFFERENTIAL/PLATELET
Basophils Absolute: 0 x10E3/uL (ref 0.0–0.2)
Basos: 0 %
EOS (ABSOLUTE): 0 x10E3/uL (ref 0.0–0.4)
Eos: 0 %
Hematocrit: 30.4 % — ABNORMAL LOW (ref 34.0–46.6)
Hemoglobin: 10.3 g/dL — ABNORMAL LOW (ref 11.1–15.9)
Immature Grans (Abs): 0.1 x10E3/uL (ref 0.0–0.1)
Immature Granulocytes: 1 %
Lymphocytes Absolute: 3 x10E3/uL (ref 0.7–3.1)
Lymphs: 22 %
MCH: 29.9 pg (ref 26.6–33.0)
MCHC: 33.9 g/dL (ref 31.5–35.7)
MCV: 88 fL (ref 79–97)
Monocytes Absolute: 0.9 x10E3/uL (ref 0.1–0.9)
Monocytes: 7 %
Neutrophils Absolute: 9.5 x10E3/uL — ABNORMAL HIGH (ref 1.4–7.0)
Neutrophils: 70 %
Platelets: 393 x10E3/uL (ref 150–450)
RBC: 3.44 x10E6/uL — ABNORMAL LOW (ref 3.77–5.28)
RDW: 12.8 % (ref 11.7–15.4)
WBC: 13.5 x10E3/uL — ABNORMAL HIGH (ref 3.4–10.8)

## 2018-10-01 LAB — COMPREHENSIVE METABOLIC PANEL
ALT: 13 IU/L (ref 0–32)
AST: 18 IU/L (ref 0–40)
Albumin/Globulin Ratio: 1.7 (ref 1.2–2.2)
Albumin: 3.5 g/dL — ABNORMAL LOW (ref 3.8–4.8)
Alkaline Phosphatase: 141 IU/L — ABNORMAL HIGH (ref 39–117)
BUN/Creatinine Ratio: 17 (ref 9–23)
BUN: 10 mg/dL (ref 6–20)
Bilirubin Total: 0.2 mg/dL (ref 0.0–1.2)
CO2: 19 mmol/L — ABNORMAL LOW (ref 20–29)
Calcium: 9.1 mg/dL (ref 8.7–10.2)
Chloride: 103 mmol/L (ref 96–106)
Creatinine, Ser: 0.58 mg/dL (ref 0.57–1.00)
GFR calc Af Amer: 140 mL/min/{1.73_m2} (ref >59)
GFR calc non Af Amer: 121 mL/min/{1.73_m2} (ref >59)
Globulin, Total: 2.1 g/dL (ref 1.5–4.5)
Glucose: 88 mg/dL (ref 65–99)
Potassium: 3.8 mmol/L (ref 3.5–5.2)
Sodium: 136 mmol/L (ref 134–144)
Total Protein: 5.6 g/dL — ABNORMAL LOW (ref 6.0–8.5)

## 2018-10-01 LAB — PROTEIN / CREATININE RATIO, URINE
Creatinine, Urine: 228.6 mg/dL
Protein, Ur: 30.5 mg/dL
Protein/Creat Ratio: 133 mg/g{creat} (ref 0–200)

## 2018-10-04 ENCOUNTER — Observation Stay
Admission: EM | Admit: 2018-10-04 | Discharge: 2018-10-04 | Disposition: A | Discharge status: Home / Self Care | Payer: Medicaid Other | Attending: Obstetrics and Gynecology | Admitting: Obstetrics and Gynecology

## 2018-10-04 ENCOUNTER — Telehealth: Payer: Self-pay

## 2018-10-04 ENCOUNTER — Other Ambulatory Visit: Payer: Self-pay

## 2018-10-04 DIAGNOSIS — O34219 Maternal care for unspecified type scar from previous cesarean delivery: Secondary | ICD-10-CM

## 2018-10-04 DIAGNOSIS — Z7982 Long term (current) use of aspirin: Secondary | ICD-10-CM | POA: Diagnosis not present

## 2018-10-04 DIAGNOSIS — K219 Gastro-esophageal reflux disease without esophagitis: Secondary | ICD-10-CM | POA: Insufficient documentation

## 2018-10-04 DIAGNOSIS — O10919 Unspecified pre-existing hypertension complicating pregnancy, unspecified trimester: Secondary | ICD-10-CM

## 2018-10-04 DIAGNOSIS — O99613 Diseases of the digestive system complicating pregnancy, third trimester: Secondary | ICD-10-CM | POA: Insufficient documentation

## 2018-10-04 DIAGNOSIS — R109 Unspecified abdominal pain: Secondary | ICD-10-CM

## 2018-10-04 DIAGNOSIS — Z3A35 35 weeks gestation of pregnancy: Secondary | ICD-10-CM | POA: Diagnosis not present

## 2018-10-04 DIAGNOSIS — O0990 Supervision of high risk pregnancy, unspecified, unspecified trimester: Secondary | ICD-10-CM

## 2018-10-04 DIAGNOSIS — O403XX0 Polyhydramnios, third trimester, not applicable or unspecified: Secondary | ICD-10-CM | POA: Diagnosis present

## 2018-10-04 DIAGNOSIS — O26893 Other specified pregnancy related conditions, third trimester: Secondary | ICD-10-CM

## 2018-10-04 DIAGNOSIS — R1030 Lower abdominal pain, unspecified: Secondary | ICD-10-CM | POA: Diagnosis present

## 2018-10-04 DIAGNOSIS — O368130 Decreased fetal movements, third trimester, not applicable or unspecified: Principal | ICD-10-CM | POA: Insufficient documentation

## 2018-10-04 LAB — CBC
HCT: 28.9 % — ABNORMAL LOW (ref 36.0–46.0)
Hemoglobin: 9.7 g/dL — ABNORMAL LOW (ref 12.0–15.0)
MCH: 30.3 pg (ref 26.0–34.0)
MCHC: 33.6 g/dL (ref 30.0–36.0)
MCV: 90.3 fL (ref 80.0–100.0)
Platelets: 351 10*3/uL (ref 150–400)
RBC: 3.2 MIL/uL — ABNORMAL LOW (ref 3.87–5.11)
RDW: 13.5 % (ref 11.5–15.5)
WBC: 13.8 10*3/uL — ABNORMAL HIGH (ref 4.0–10.5)
nRBC: 0 % (ref 0.0–0.2)

## 2018-10-04 MED ORDER — SUCRALFATE 1 G PO TABS: 1.0000 g | ORAL_TABLET | Freq: Four times a day (QID) | ORAL | 5 refills | Status: DC

## 2018-10-04 MED ORDER — DEXTROSE 5 % IN LACTATED RINGERS IV BOLUS
500.0000 mL | Freq: Once | INTRAVENOUS | Status: AC
  Administered 2018-10-04: 500 mL via INTRAVENOUS

## 2018-10-04 MED ORDER — ACETAMINOPHEN 500 MG PO TABS
1000.0000 mg | ORAL_TABLET | Freq: Four times a day (QID) | ORAL | Status: DC | PRN
  Administered 2018-10-04: 18:00:00 1000 mg via ORAL
  Filled 2018-10-04: qty 2

## 2018-10-04 MED ORDER — ACETAMINOPHEN-CODEINE #3 300-30 MG PO TABS: 1.0000 | ORAL_TABLET | Freq: Four times a day (QID) | ORAL | 0 refills | Status: DC | PRN

## 2018-10-04 MED ORDER — PANTOPRAZOLE SODIUM 40 MG IV SOLR: 40.0000 mg | Freq: Two times a day (BID) | INTRAVENOUS | Status: DC

## 2018-10-04 NOTE — Discharge Instructions (Signed)
Fetal Movement Counts Patient Name: ________________________________________________ Patient Due Date: ____________________ What is a fetal movement count?  A fetal movement count is the number of times that you feel your baby move during a certain amount of time. This may also be called a fetal kick count. A fetal movement count is recommended for every pregnant woman. You may be asked to start counting fetal movements as early as week 28 of your pregnancy. Pay attention to when your baby is most active. You may notice your baby's sleep and wake cycles. You may also notice things that make your baby move more. You should do a fetal movement count:  When your baby is normally most active.  At the same time each day. A good time to count movements is while you are resting, after having something to eat and drink. How do I count fetal movements? 1. Find a quiet, comfortable area. Sit, or lie down on your side. 2. Write down the date, the start time and stop time, and the number of movements that you felt between those two times. Take this information with you to your health care visits. 3. For 2 hours, count kicks, flutters, swishes, rolls, and jabs. You should feel at least 10 movements during 2 hours. 4. You may stop counting after you have felt 10 movements. 5. If you do not feel 10 movements in 2 hours, have something to eat and drink. Then, keep resting and counting for 1 hour. If you feel at least 4 movements during that hour, you may stop counting. Contact a health care provider if:  You feel fewer than 4 movements in 2 hours.  Your baby is not moving like he or she usually does. Date: ____________ Start time: ____________ Stop time: ____________ Movements: ____________ Date: ____________ Start time: ____________ Stop time: ____________ Movements: ____________ Date: ____________ Start time: ____________ Stop time: ____________ Movements: ____________ Date: ____________ Start time:  ____________ Stop time: ____________ Movements: ____________ Date: ____________ Start time: ____________ Stop time: ____________ Movements: ____________ Date: ____________ Start time: ____________ Stop time: ____________ Movements: ____________ Date: ____________ Start time: ____________ Stop time: ____________ Movements: ____________ Date: ____________ Start time: ____________ Stop time: ____________ Movements: ____________ Date: ____________ Start time: ____________ Stop time: ____________ Movements: ____________ This information is not intended to replace advice given to you by your health care provider. Make sure you discuss any questions you have with your health care provider. Document Released: 03/25/2006 Document Revised: 03/15/2018 Document Reviewed: 04/04/2015 Elsevier Patient Education  2020 [employer].

## 2018-10-04 NOTE — OB Triage Note (Signed)
Pt. Presented to L/D with reported decreased FM since yesterday. She has felt no FM today. FHT 144. When laying on her left side, she coughed and felt a "popping" in her left side. She has intermittent, RLQ and LLQ abdominal pain with occasional side pain. She rates it 6/10 and describes it as sharp.No urinary symptoms. Denies bleeding or LOF. VSS. Will continue to monitor.

## 2018-10-04 NOTE — Telephone Encounter (Signed)
Pt was tx'd to me by SP; pt states she was lying on her left side, coughed real hard, felt like something ripped apart; hurts really bad on the left side and to the right side. No leaking of fluid.  Also c/o decreased fetal movement the last few days.  Adv to go to L&D via ED.  Mary aware.

## 2018-10-04 NOTE — Discharge Summary (Signed)
Physician Discharge Summary   Patient ID: Carol Small [id number] 33 y.o. [birth year]  Admit date: 10/04/2018  Discharge date and time: No discharge date for patient encounter.   Admitting Physician: Homero Fellers, MD   Discharge Physician: Adrian Prows  Admission Diagnoses: 35 weeks preg lower abd pain  Discharge Diagnoses: Decreased fetal movement and Abdominal pain  Admission Condition: good  Discharged Condition: good  Indication for Admission: Patient was admitted for reports of decreased fetal movement.   Hospital Course: Her NST was reactive. She had complaints of abdominal pain but was resting comfortably in bed. She is not feeling contractions. She has significant polyhydramnios which has been a source of pain and discomfort for her. She is being follow my MFM. She has a cesarean section planned for 37 weeks because of this pregnancy complication. She has been having acid reflux which has not been controlled with TUMS.  Will discharge her home with Tylenol 3 for pain and carafate for GERD.    Consults: None  Significant Diagnostic Studies: labs: none  Treatments: IV hydration  Discharge Exam: BP 132/73 (BP Location: Left Arm)   Pulse 99   Resp 18   Ht 5\' 2"  (1.575 m)   Wt 82.1 kg   LMP 01/26/2018 (Exact Date)   BMI 33.11 kg/m   General Appearance:    Alert, cooperative, no distress, appears stated age  Head:    Normocephalic, without obvious abnormality, atraumatic  Eyes:    PERRL, conjunctiva/corneas clear, EOM's intact, fundi    benign, both eyes  Ears:    Normal TM's and external ear canals, both ears  Nose:   Nares normal, septum midline, mucosa normal, no drainage    or sinus tenderness  Throat:   Lips, mucosa, and tongue normal; teeth and gums normal  Neck:   Supple, symmetrical, trachea midline, no adenopathy;    thyroid:  no enlargement/tenderness/nodules; no carotid   bruit or JVD  Back:     Symmetric, no curvature, ROM normal, no  CVA tenderness  Lungs:     Clear to auscultation bilaterally, respirations unlabored  Chest Wall:    No tenderness or deformity   Heart:    Regular rate and rhythm, S1 and S2 normal, no murmur, rub   or gallop  Breast Exam:    No tenderness, masses, or nipple abnormality  Abdomen:     Soft, non-tender, bowel sounds active all four quadrants,    no masses, no organomegaly  Genitalia:    Normal female without lesion, discharge or tenderness  Rectal:    Normal tone, normal prostate, no masses or tenderness;   guaiac negative stool  Extremities:   Extremities normal, atraumatic, no cyanosis or edema  Pulses:   2+ and symmetric all extremities  Skin:   Skin color, texture, turgor normal, no rashes or lesions  Lymph nodes:   Cervical, supraclavicular, and axillary nodes normal  Neurologic:   CNII-XII intact, normal strength, sensation and reflexes    throughout    Disposition:  There are no questions and answers to display.        Patient Instructions:  Allergies as of 10/04/2018      Reactions   Erythromycin Base Other (See Comments)   Stomach cramps   Adhesive [tape] Other (See Comments)   Skin becomes raw   Nickel Rash      Medication List    TAKE these medications   acetaminophen-codeine 300-30 MG tablet Commonly known as: TYLENOL #3 Take 1  tablet by mouth every 6 (six) hours as needed for moderate pain.   aspirin EC 81 MG tablet Take 1 tablet (81 mg total) by mouth daily. Take after 12 weeks for prevention of preeclampssia later in pregnancy   calcium carbonate 500 MG chewable tablet Commonly known as: TUMS - dosed in mg elemental calcium Chew 1 tablet by mouth as needed for indigestion or heartburn.   CitraNatal Harmony 27-1-260 MG Caps Take 1 tablet by mouth daily.   escitalopram 10 MG tablet Commonly known as: LEXAPRO Take 10 mg by mouth daily.   famotidine 20 MG tablet Commonly known as: PEPCID Take 1 tablet (20 mg total) by mouth 2 (two) times daily.    labetalol 100 MG tablet Commonly known as: NORMODYNE Take 1 tablet (100 mg total) by mouth at bedtime.   sucralfate 1 g tablet Commonly known as: Carafate Take 1 tablet (1 g total) by mouth 4 (four) times daily.   traZODone 50 MG tablet Commonly known as: DESYREL Take 50 mg by mouth at bedtime.   traZODone 50 MG tablet Commonly known as: DESYREL Take 1 tablet (50 mg total) by mouth at bedtime as needed for sleep.      Activity: activity as tolerated Diet: regular diet Wound Care: none needed  Follow-up with [facility] in 1 day.  Signed: Homero Fellers 10/04/2018 6:58 PM

## 2018-10-04 NOTE — Progress Notes (Signed)
Discharge instructions given to pt, kick counts and labor precautions given. Pt verbalized understanding. Pt walked out.

## 2018-10-05 ENCOUNTER — Ambulatory Visit (INDEPENDENT_AMBULATORY_CARE_PROVIDER_SITE_OTHER): Payer: Medicaid Other

## 2018-10-05 ENCOUNTER — Ambulatory Visit (INDEPENDENT_AMBULATORY_CARE_PROVIDER_SITE_OTHER): Payer: Medicaid Other | Admitting: Obstetrics and Gynecology

## 2018-10-05 VITALS — BP 119/83 | Wt 188.0 lb

## 2018-10-05 DIAGNOSIS — O09299 Supervision of pregnancy with other poor reproductive or obstetric history, unspecified trimester: Secondary | ICD-10-CM

## 2018-10-05 DIAGNOSIS — O0993 Supervision of high risk pregnancy, unspecified, third trimester: Secondary | ICD-10-CM

## 2018-10-05 DIAGNOSIS — R109 Unspecified abdominal pain: Secondary | ICD-10-CM

## 2018-10-05 DIAGNOSIS — Z3689 Encounter for other specified antenatal screening: Secondary | ICD-10-CM

## 2018-10-05 DIAGNOSIS — O26893 Other specified pregnancy related conditions, third trimester: Secondary | ICD-10-CM

## 2018-10-05 DIAGNOSIS — O0990 Supervision of high risk pregnancy, unspecified, unspecified trimester: Secondary | ICD-10-CM

## 2018-10-05 DIAGNOSIS — O10913 Unspecified pre-existing hypertension complicating pregnancy, third trimester: Secondary | ICD-10-CM

## 2018-10-05 DIAGNOSIS — O99210 Obesity complicating pregnancy, unspecified trimester: Secondary | ICD-10-CM

## 2018-10-05 DIAGNOSIS — O403XX0 Polyhydramnios, third trimester, not applicable or unspecified: Secondary | ICD-10-CM

## 2018-10-05 DIAGNOSIS — O99213 Obesity complicating pregnancy, third trimester: Secondary | ICD-10-CM

## 2018-10-05 DIAGNOSIS — O09293 Supervision of pregnancy with other poor reproductive or obstetric history, third trimester: Secondary | ICD-10-CM

## 2018-10-05 DIAGNOSIS — O10919 Unspecified pre-existing hypertension complicating pregnancy, unspecified trimester: Secondary | ICD-10-CM

## 2018-10-05 DIAGNOSIS — Z3A35 35 weeks gestation of pregnancy: Secondary | ICD-10-CM

## 2018-10-05 DIAGNOSIS — O34219 Maternal care for unspecified type scar from previous cesarean delivery: Secondary | ICD-10-CM

## 2018-10-05 LAB — POCT URINALYSIS DIPSTICK OB
Glucose, UA: NEGATIVE
POC,PROTEIN,UA: NEGATIVE

## 2018-10-05 NOTE — Progress Notes (Signed)
Routine Prenatal Care Visit  Subjective  Carol Small is a 33 y.o. G3P1011 at [redacted]w[redacted]d being seen today for ongoing prenatal care.  She is currently monitored for the following issues for this high-risk pregnancy and has Hypertension; Major depressive disorder; Supervision of high risk pregnancy, antepartum; Obesity affecting pregnancy, antepartum; Tobacco use affecting pregnancy, antepartum; History of pre-eclampsia in prior pregnancy, currently pregnant; Chronic hypertension affecting pregnancy; Anxiety; Right upper quadrant abdominal pain affecting pregnancy in third trimester; Polyhydramnios in third trimester; History of cesarean section complicating pregnancy; Abdominal pain during pregnancy in third trimester; and Polyhydramnios affecting pregnancy in third trimester on their problem list.  ----------------------------------------------------------------------------------- Patient reports pressure, contractions.  Was seen and evaluated on L&D yesterday.   Contractions: Irregular. Vag. Bleeding: None.  Movement: Present. Denies leaking of fluid.  ----------------------------------------------------------------------------------- The following portions of the patient's history were reviewed and updated as appropriate: allergies, current medications, past family history, past medical history, past social history, past surgical history and problem list. Problem list updated.   Objective  Blood pressure 119/83, weight 188 lb (85.3 kg), last menstrual period 01/26/2018. Pregravid weight 171 lb (77.6 kg) Total Weight Gain 17 lb (7.711 kg) Urinalysis:      Fetal Status: Fetal Heart Rate (bpm): 145   Movement: Present     General:  Alert, oriented and cooperative. Patient is in no acute distress.  Skin: Skin is warm and dry. No rash noted.   Cardiovascular: Normal heart rate noted  Respiratory: Normal respiratory effort, no problems with respiration noted  Abdomen: Soft, gravid,  appropriate for gestational age. Pain/Pressure: Present     Pelvic:  Cervical exam deferred        Extremities: Normal range of motion.     ental Status: Normal mood and affect. Normal behavior. Normal judgment and thought content.   US Ob Detail + 14 Wk  Result Date: 09/23/2018 Patient Name: Carol Small DOB: [birth year] MRN: [id number] ULTRASOUND REPORT Location: [facility] Date of Service: 09/23/2018 Indications:AFI Findings: Nelda [place] intrauterine pregnancy is visualized with FHR at 152 BPM. Fetal presentation is Cephalic. Placenta: posterior. Grade: 1 AFI: 39.0 cm Impression: 1. [redacted]w[redacted]d Viable Singleton Intrauterine pregnancy dated by previously established criteria. 2. AFI is 39.0 cm. Recommendations: 1.Clinical correlation with the patient's History and Physical Exam. Gweneth Dimitri, RT There is a singleton gestation with severe polyhdramnios. Polyhydramnios is the increase in amniotic fluid volume around the fetus describes as single deepest vertical pocket (SDP) of 8cm or amniotic fluid index (AFI) of >24.0cm, and affects approximately 1-2% of singleton pregnancies.  Previous research studies have used an amniotic fluid index cut of of >25cm, however 24.0cm represents the 97.5%ile at all gestational ages.  Polyhydramnios is further subclassified as mild 24.0-29.9cm or SDP 8-11cm comprising 65-70% of cases, moderate AFI 30.0-34.9cm or SDP 12-15cm comprising 20% of cases, and severe AFI >35cm or SDP >16cm comprising <15% of cases.  The majority of cases (60-70%) will be idiopathic, with the 2 most common pathologic causes attributable to maternal diabetes or fetal anomalies.  Idiopathic polyhdramnios is associated with an increased risk of macrosomia, with birth weight >4000g in 15-30% of cases as opposed to 8% in the general population.  The constellation of polyhydramnios and intrauterine growth restriction raises concern for trisomy 13 or 18.  Amnioreduction should be limited to patient with  severe maternal discomfort, dyspnea, or both.  The use of indomethacin purely for the reduction of amniotic fluid volume is not recommended.  Isolated mild  idiopathic polyhydramnios does not require additional antepartum surveillance, and delivery is not recommended prior to 39 weeks.  Cases of severe polyhydramnios should consider delivery at a tertiary care center given the high risk of associated fetal anatomic anomalies (20-40%).  [facility] Consult Series #[id number]: Evaluation and management of polyhydramnios October 2018any additional antepartum fetal surveillance The patient has had negative NIPT testing, elevated 1-hr OGTT of 158 with normal 3-hr OGTT  83, 162, 120, 113. The visualized fetal anatomical survey appears within normal limits within the resolution of ultrasound as described above.  In particular the stomach appears appropriately distended, no evidence of double bubble sign or other GI tract obstruction.  It must be noted that a normal ultrasound is unable to rule out fetal aneuploidy.  Malachy Mood, MD, [facility], [facility] 09/23/2018, 9:24 PM   US Ob Limited  Result Date: 10/05/2018 Patient Name: Carol Small DOB: [birth year] MRN: [id number] ULTRASOUND REPORT Location: [facility] Date of Service: 10/05/2018 Indications:AFI Findings: Nelda [place] intrauterine pregnancy is visualized with FHR at 145 BPM. Fetal presentation is Cephalic. Placenta: posterior. Grade: 2 AFI: 37.6 cm Impression: 1. [redacted]w[redacted]d Viable Singleton Intrauterine pregnancy dated by previously established criteria. 2. AFI is 37.6 cm. Recommendations: 1.Clinical correlation with the patient's History and Physical Exam Gweneth Dimitri, RT There is a singleton gestation with amniotic fluid volume consistent with severe polyhydramnios. The visualized fetal anatomical survey appears within normal limits within the resolution of ultrasound as described above.  It must be noted that a normal ultrasound is unable  to rule out fetal aneuploidy.  Malachy Mood, MD, [facility], [facility] 10/05/2018, 2:01 PM   US Ob Limited  Result Date: 09/30/2018 Patient Name: Carol Small DOB: 09-16-1985 MRN: [id number] ULTRASOUND REPORT Location: [facility] Date of Service: 09/30/2018 Indications:AFI Findings: Nelda [place] intrauterine pregnancy is visualized with FHR at 148 BPM. Fetal presentation is Cephalic. Placenta: posterior. Grade: 2 AFI: 29.6 cm Impression: 1. [redacted]w[redacted]d Viable Singleton Intrauterine pregnancy dated by previously established criteria. 2. Mild Polyhdramnios.  AFI is 29.6 cm. Gweneth Dimitri, RT The ultrasound images and findings were reviewed by me and I agree with the above report. Prentice Docker, MD, [facility], [facility] 09/30/2018 6:35 PM     US Ob Follow Up  Result Date: 09/15/2018 Patient Name: Carol Small DOB: September 14, 1985 MRN: [id number] ULTRASOUND REPORT Location: [facility] Date of Service: 09/15/2018 Indications:growth/afi Findings: Nelda [place]se[place] intrauterine pregnancy is visualized with FHR at 141 BPM. Biometrics give an (U/S) Gestational age of [redacted]w[redacted]d and an (U/S) EDD of 11/11/2018; this correlates with the clinically established Estimated Date of Delivery: 11/09/18. Fetal presentation is Cephalic. Placenta: posterior. Grade: 2 AFI: 37.2 cm Growth percentile is 40.7. EFW: 1836 g ( 4 lb 1 oz ) Impression: 1. [redacted]w[redacted]d Viable Singleton Intrauterine pregnancy previously established criteria. 2. Growth is 40.7 %ile.  3. Polyhydramnios- AFI is 37.2 cm. Recommendations: 1.Clinical correlation with the patient's History and Physical Exam. Gweneth Dimitri, RT I have reviewed this ultrasound and the report. I agree with the above assessment and plan. [facility] 09/15/18 12:01 PM    [place] Mfm Ob Detail +14 Wk  Result Date:  09/26/2018 ----------------------------------------------------------------------  OBSTETRICS REPORT                       (Signed Final 09/26/2018 03:49 pm) ---------------------------------------------------------------------- PATIENT INFO:  ID #:  [id number]                          D.O.B.:  1985-07-24 (33 yrs)  Name:       Carol Small                 Visit Date: 09/26/2018 02:15 pm ---------------------------------------------------------------------- PERFORMED BY:  Performed By:     Rosalia Hammers          Referred By:      Lucia Estelle ---------------------------------------------------------------------- SERVICE(S) PROVIDED:   [place] MFM OB DETAIL +14 WK                              76811.01  ---------------------------------------------------------------------- INDICATIONS:   [redacted] weeks gestation of pregnancy                Z3A.34  ---------------------------------------------------------------------- FETAL EVALUATION:  Num Of Fetuses:         1  Fetal Heart Rate(bpm):  144  Cardiac Activity:       Present  Presentation:           Cephalic  Placenta:               Fundal  AFI Sum(cm)     %Tile       Largest Pocket(cm)  30.6            > 97        8.2  RUQ(cm)       RLQ(cm)       LUQ(cm)        LLQ(cm)  8             6.97          8.2            7.43 ---------------------------------------------------------------------- BIOMETRY:  BPD:      84.4  mm     G. Age:  34w 0d         47  %    CI:        76.38   %    70 - 86                                                          FL/HC:      21.0   %    19.4 - 21.8  HC:       306   mm     G. Age:  34w 1d         18  %    HC/AC:      1.01        0.96 - 1.11  AC:      301.5  mm     G. Age:  34w 1d         57  %    FL/BPD:     76.1   %    71 - 87  FL:       64.2  mm     G. Age:  33w 1d         20  %    FL/AC:      21.3   %    20 - 24  Est. FW:    2285  gm      5 lb 1 oz     41  %  ---------------------------------------------------------------------- GESTATIONAL AGE:  LMP:           34w 5d        Date:  01/26/18                 EDD:   11/02/18  U/S Today:     33w 6d                                        EDD:   11/08/18  Best:          34w 0d     Det. By:  Loman Chroman         EDD:   11/07/18                                      (03/30/18) ---------------------------------------------------------------------- ANATOMY:  Cranium:               Within Normal Limits   LVOT:                   Normal appearance  Cavum:                 CSP visualized         Aortic Arch:            Within normal limits  Ventricles:            Normal appearance      Diaphragm:              Within Normal Limits  Choroid Plexus:        Suboptimal             Stomach:                Seen  Cerebellum:            Suboptimal             Abdomen:                Within Normal                                                                        Limits  Posterior Fossa:       Within Normal Limits   Abdominal Wall:         Normal appearance  Nuchal Fold:           Within Normal Limits   Cord Vessels:           3 vessels  Face:  Orbits visualized      Kidneys:                Normal appearance  Lips:                  Normal appearance      Bladder:                Seen  Thoracic:              Within Normal Limits   Spine:                  Normal appearance  Heart:                 4-Chamber view         Upper Extremities:      Visualized                         appears normal  RVOT:                  Visualized             Lower Extremities:      Visualized ---------------------------------------------------------------------- CERVIX UTERUS ADNEXA:  Cervix  Suboptimal ---------------------------------------------------------------------- IMPRESSION:  Thank you for referring your patient  for a fetal anatomical  survey due to severe polyhydramnios noted on ultrasound in  your office with AFI 37. Her history is  significant for chronic  hth (on labetalol 100mg  bid).  She had normal glucose  screening.  Ultrasound demonstrates a single, live pregnancy at [redacted]  weeks gestation.  Dating is by earliest available ultrasound  performed at [facility] radiology on 03/14/18; measurements were  consistent with 5 weeks 5 days.  The fetal biometry correlates with established dating.  The  AFI is 31.  Detailed evaluation of the fetal anatomy was performed.  Images of the hands, IVD/SVD, 3VV, and profile were  suboptimally imaged due to advanced gestational age and  position.  The remainder of the anatomic survey is  unremarkable. The fetus was noted to be moving freely, and  the nose and lips were visualized.  There were no dilated  loops of bowel, and the kidneys appeared WNL.  The bladder  was visualized and there was no sign of fetal hydrops.  An incidental BPP of 8/8 is seen.  It must be noted that a normal ultrasound cannot rule out  aneuploidy.  Polyhydramnios was noted on the ultrasound today.  The  fetus was moving well and there was a stomach bubble seen.  The BPP was noted to be 8/8, which is reassuring.  Findings were reviewed. Polyhydramnios (AFI>=24 or MVP  >=8) complicated 3-9% of all deliveries and most common  causes are DM and congnital anomalies.  Congenital anomalies in all organ systems have been linked  with excessive accumulation of amniotic fluid. The most  common structural defects associated with polyhydramnios  are those that interfere with fetal swallowing and/or  absorption of fluid, such as gastrointestinal obstruction due to  duodenal, esophageal, or intestinal atresia. Decreased  swallowing may also be due to neuromuscular disorders.  An  AFI <30 is more frequently associated with idiopathic  polyhydramnios, while an etiology is more likely identified  when the AFI >30.  The patient was noted to have a normal  normal GCT. Her  fasting blood sugar was 97 today.  The patient was counseled regarding these factors and   possible etiologies;  the patient was informed that in a normal  appearing fetus with normal movement in a non-diabetic  mother, the most likely diagnosis is idiopathic.  However,  polyhydramnios has been found to be associated with fetal  macrosomia, fetal malposition, umbilical cord prolapse,  postpartum uterine atony, preterm labor, as well as abruption  in the setting of membrane rupture.  Recommend weekly antenatal testing (BPP or NST/AFI.  She  is scheduled for delivery on 8/13.  Please do not hesitate to contact us if we can be of further  assistance. ----------------------------------------------------------------------                   Manfred Shirts, MD Electronically Signed Final Report   09/26/2018 03:49 pm ----------------------------------------------------------------------    Assessment   33 y.o. G3P1011 at [redacted]w[redacted]d by  11/09/2018, by Ultrasound presenting for routine prenatal visit  Plan   pregnancy Problems (from 03/23/18 to present)    Problem Noted Resolved   Polyhydramnios in third trimester 09/23/2018 by Malachy Mood, MD No   History of cesarean section complicating pregnancy 04/22/863 by Malachy Mood, MD No   Chronic hypertension affecting pregnancy 06/09/2018 by Will Bonnet, MD No   Overview Signed 06/09/2018 11:56 AM by Will Bonnet, MD    [ ]  Aspirin 81 mg daily after 12 weeks; discontinue after 36 weeks [ ]  baseline labs with CBC, CMP, urine protein/creatinine ratio [ ]  no BP meds unless BPs become elevated [ ]  ultrasound for growth at 28, 32, 36 weeks [ ]  Aspirin 81 mg daily after 12 weeks; discontinue after 36 weeks [ ]  Baseline EKG   Current antihypertensives:  Labetalol 100 mg bid  Baseline and surveillance labs (pulled in from [facility], refresh links as needed)  Lab Results  Component Value Date   PLT 277 04/12/2018   CREATININE 0.57 04/12/2018   AST 17 04/12/2018   ALT 13 04/12/2018    Antenatal Testing CHTN - O10.919  Group I  BP < 140/90,  no preeclampsia, AGA,  nml AFV, +/- meds    Group Small BP > 140/90, on meds, no preeclampsia, AGA, nml AFV  20-28-34-38  20-24-28-32-35-38  32//2 x wk  28//BPP wkly then 32//2 x wk  40 no meds; 39 meds  PRN or 37  Pre-eclampsia  GHTN - O13.9/Preeclampsia without severe features  - O14.00   Preeclampsia with severe features - O14.10  Q 3-4wks  Q 2 wks  28//BPP wkly then 32//2 x wk  Inpatient  37  PRN or 34         Supervision of high risk pregnancy, antepartum 03/23/2018 by Rod Can, CNM No   Overview Addendum 09/25/2018  4:41 AM by Dalia Heading, [facility] Prenatal Labs  Dating  5wk [place] Blood type: AB/Positive/-- (02/04 1650)   Genetic Screen  NIPS: normal xy Antibody:Negative (02/04 1650)  Anatomic [place] complete Rubella: 12.40 (02/04 1650) Varicella: Immune  GTT Early:99 Third trimester: 158 3-hr 83, 162, 120, 113 RPR: Non Reactive (02/04 1650)   Rhogam  not needed HBsAg: Negative (02/04 1650)   TDaP vaccine   Flu Shot: declines HIV: Non Reactive (02/04 1650)   Baby Food                                GBS:   Contraception  Pap: 2019 NIL  CBB     CS/VBAC 2013- desires repeat   Support Person Donyell  Gestational age appropriate obstetric precautions including but not limited to vaginal bleeding, contractions, leaking of fluid and fetal movement were reviewed in detail with the patient.    - NST done yesterday at hospital - BP remains well controlled - still with severe polyhydramnios on ultrasound  Return in about 1 week (around 10/12/2018) for ROB, NST, growth scan .  Malachy Mood, MD, [facility], [facility] 10/05/2018, 2:09 PM

## 2018-10-05 NOTE — Progress Notes (Signed)
ROB AFI/NST 

## 2018-10-11 ENCOUNTER — Other Ambulatory Visit: Payer: Self-pay

## 2018-10-11 ENCOUNTER — Ambulatory Visit (INDEPENDENT_AMBULATORY_CARE_PROVIDER_SITE_OTHER): Payer: Medicaid Other

## 2018-10-11 DIAGNOSIS — Z362 Encounter for other antenatal screening follow-up: Secondary | ICD-10-CM

## 2018-10-11 DIAGNOSIS — O10919 Unspecified pre-existing hypertension complicating pregnancy, unspecified trimester: Secondary | ICD-10-CM

## 2018-10-11 DIAGNOSIS — O403XX0 Polyhydramnios, third trimester, not applicable or unspecified: Secondary | ICD-10-CM

## 2018-10-11 DIAGNOSIS — O26893 Other specified pregnancy related conditions, third trimester: Secondary | ICD-10-CM

## 2018-10-11 DIAGNOSIS — O0990 Supervision of high risk pregnancy, unspecified, unspecified trimester: Secondary | ICD-10-CM

## 2018-10-11 DIAGNOSIS — O99210 Obesity complicating pregnancy, unspecified trimester: Secondary | ICD-10-CM

## 2018-10-11 DIAGNOSIS — O34219 Maternal care for unspecified type scar from previous cesarean delivery: Secondary | ICD-10-CM

## 2018-10-11 DIAGNOSIS — O09299 Supervision of pregnancy with other poor reproductive or obstetric history, unspecified trimester: Secondary | ICD-10-CM

## 2018-10-12 ENCOUNTER — Ambulatory Visit (INDEPENDENT_AMBULATORY_CARE_PROVIDER_SITE_OTHER): Payer: Medicaid Other | Admitting: Maternal Newborn

## 2018-10-12 ENCOUNTER — Observation Stay
Admission: EM | Admit: 2018-10-12 | Discharge: 2018-10-12 | Disposition: A | Discharge status: Home / Self Care | Payer: Medicaid Other | Attending: Obstetrics and Gynecology | Admitting: Obstetrics and Gynecology

## 2018-10-12 ENCOUNTER — Encounter: Payer: Self-pay | Admitting: Maternal Newborn

## 2018-10-12 ENCOUNTER — Other Ambulatory Visit: Payer: Self-pay

## 2018-10-12 VITALS — BP 134/60 | Wt 190.6 lb

## 2018-10-12 DIAGNOSIS — O0990 Supervision of high risk pregnancy, unspecified, unspecified trimester: Secondary | ICD-10-CM

## 2018-10-12 DIAGNOSIS — K219 Gastro-esophageal reflux disease without esophagitis: Secondary | ICD-10-CM | POA: Insufficient documentation

## 2018-10-12 DIAGNOSIS — F419 Anxiety disorder, unspecified: Secondary | ICD-10-CM | POA: Insufficient documentation

## 2018-10-12 DIAGNOSIS — Z3A36 36 weeks gestation of pregnancy: Secondary | ICD-10-CM

## 2018-10-12 DIAGNOSIS — O26893 Other specified pregnancy related conditions, third trimester: Principal | ICD-10-CM | POA: Insufficient documentation

## 2018-10-12 DIAGNOSIS — O34219 Maternal care for unspecified type scar from previous cesarean delivery: Secondary | ICD-10-CM

## 2018-10-12 DIAGNOSIS — Z7982 Long term (current) use of aspirin: Secondary | ICD-10-CM | POA: Diagnosis not present

## 2018-10-12 DIAGNOSIS — Z79899 Other long term (current) drug therapy: Secondary | ICD-10-CM | POA: Diagnosis not present

## 2018-10-12 DIAGNOSIS — O403XX0 Polyhydramnios, third trimester, not applicable or unspecified: Secondary | ICD-10-CM

## 2018-10-12 DIAGNOSIS — O99333 Smoking (tobacco) complicating pregnancy, third trimester: Secondary | ICD-10-CM | POA: Diagnosis not present

## 2018-10-12 DIAGNOSIS — O99343 Other mental disorders complicating pregnancy, third trimester: Secondary | ICD-10-CM | POA: Insufficient documentation

## 2018-10-12 DIAGNOSIS — N898 Other specified noninflammatory disorders of vagina: Secondary | ICD-10-CM | POA: Diagnosis not present

## 2018-10-12 DIAGNOSIS — O10919 Unspecified pre-existing hypertension complicating pregnancy, unspecified trimester: Secondary | ICD-10-CM

## 2018-10-12 DIAGNOSIS — O163 Unspecified maternal hypertension, third trimester: Secondary | ICD-10-CM | POA: Diagnosis not present

## 2018-10-12 DIAGNOSIS — O10913 Unspecified pre-existing hypertension complicating pregnancy, third trimester: Secondary | ICD-10-CM

## 2018-10-12 DIAGNOSIS — O99613 Diseases of the digestive system complicating pregnancy, third trimester: Secondary | ICD-10-CM | POA: Diagnosis not present

## 2018-10-12 LAB — POCT URINALYSIS DIPSTICK OB
Glucose, UA: NEGATIVE
POC,PROTEIN,UA: NEGATIVE

## 2018-10-12 LAB — ROM PLUS (ARMC ONLY): Rom Plus: NEGATIVE

## 2018-10-12 NOTE — Progress Notes (Signed)
Routine Prenatal Care Visit  Subjective  Carol Small is a 33 y.o. G3P1011 at [redacted]w[redacted]d being seen today for ongoing prenatal care.  She is currently monitored for the following issues for this high-risk pregnancy and has Hypertension; Major depressive disorder; Supervision of high risk pregnancy, antepartum; Obesity affecting pregnancy, antepartum; Tobacco use affecting pregnancy, antepartum; History of pre-eclampsia in prior pregnancy, currently pregnant; Chronic hypertension affecting pregnancy; Anxiety; Right upper quadrant abdominal pain affecting pregnancy in third trimester; Polyhydramnios in third trimester; History of cesarean section complicating pregnancy; Abdominal pain during pregnancy in third trimester; and Polyhydramnios affecting pregnancy in third trimester on their problem list.  ----------------------------------------------------------------------------------- Patient reports loss of clear fluid around 0400 that soaked her underwear. The fluid was odorless and did not have the appearance of urine. She has not had any leaking of fluid since that time. She has had some intermittent contractions that have spaced out over the day and are only occurring occasionally now. No vaginal bleeding. ----------------------------------------------------------------------------------- The following portions of the patient's history were reviewed and updated as appropriate: allergies, current medications, past family history, past medical history, past social history, past surgical history and problem list. Problem list updated.   Objective  Blood pressure 134/60, weight 190 lb 9.6 oz (86.5 kg), last menstrual period 01/26/2018. Pregravid weight 171 lb (77.6 kg) Total Weight Gain 19 lb 9.6 oz (8.891 kg)   General:  Alert, oriented and cooperative. Patient is in no acute distress.  Skin: Skin is warm and dry. No rash noted.   Cardiovascular: Normal heart rate noted  Respiratory: Normal  respiratory effort, no problems with respiration noted  Abdomen: Soft, gravid, appropriate for gestational age.       Pelvic:  Cervical exam deferred        Extremities: Normal range of motion.     Mental Status: Normal mood and affect. Normal behavior. Normal judgment and thought content.     Assessment   33 y.o. G3P1011 at [redacted]w[redacted]d, EDD 11/09/2018 by Ultrasound presenting for a work-in prenatal visit.  Plan   pregnancy Problems (from 03/23/18 to present)    Problem Noted Resolved   Polyhydramnios in third trimester 09/23/2018 by Malachy Mood, MD No   History of cesarean section complicating pregnancy 9/37/1696 by Malachy Mood, MD No   Chronic hypertension affecting pregnancy 06/09/2018 by Will Bonnet, MD No   Overview Signed 06/09/2018 11:56 AM by Will Bonnet, MD    [ ]  Aspirin 81 mg daily after 12 weeks; discontinue after 36 weeks [ ]  baseline labs with CBC, CMP, urine protein/creatinine ratio [ ]  no BP meds unless BPs become elevated [ ]  ultrasound for growth at 28, 32, 36 weeks [ ]  Aspirin 81 mg daily after 12 weeks; discontinue after 36 weeks [ ]  Baseline EKG   Current antihypertensives:  Labetalol 100 mg bid  Baseline and surveillance labs (pulled in from [facility], refresh links as needed)  Lab Results  Component Value Date   PLT 277 04/12/2018   CREATININE 0.57 04/12/2018   AST 17 04/12/2018   ALT 13 04/12/2018    Antenatal Testing CHTN - O10.919  Group I  BP < 140/90, no preeclampsia, AGA,  nml AFV, +/- meds    Group Small BP > 140/90, on meds, no preeclampsia, AGA, nml AFV  20-28-34-38  20-24-28-32-35-38  32//2 x wk  28//BPP wkly then 32//2 x wk  40 no meds; 39 meds  PRN or 37  Pre-eclampsia  GHTN - O13.9/Preeclampsia without severe  features  - O14.00   Preeclampsia with severe features - O14.10  Q 3-4wks  Q 2 wks  28//BPP wkly then 32//2 x wk  Inpatient  37  PRN or 34         Supervision of high risk pregnancy, antepartum  03/23/2018 by Rod Can, CNM No   Overview Addendum 09/25/2018  4:41 AM by Dalia Heading, [place] Prenatal Labs  Dating  5wk [place] Blood type: AB/Positive/-- (02/04 1650)   Genetic Screen  NIPS: normal xy Antibody:Negative (02/04 1650)  Anatomic [place] complete Rubella: 12.40 (02/04 1650) Varicella: Immune  GTT Early:99 Third trimester: 158 3-hr 83, 162, 120, 113 RPR: Non Reactive (02/04 1650)   Rhogam  not needed HBsAg: Negative (02/04 1650)   TDaP vaccine   Flu Shot: declines HIV: Non Reactive (02/04 1650)   Baby Food                                GBS:   Contraception  Pap: 2019 NIL  CBB     CS/VBAC 2013- desires repeat   Support Person Donyell             Nitrazine negative, pooling negative, ferning equivocal (difficult to see because of other discharge).  To Labor and Delivery triage for ROM Plus to determine membrane status.  Avel Sensor, CNM 10/12/2018  2:09 PM

## 2018-10-12 NOTE — OB Triage Note (Signed)
Pt. presented to L/D triage with complains of leaking of fluid since 0400. She describes it as clear. She reports no bleeding. Positive fetal movement. Some intermittent abdominal pain noted. She is being seen for polyhydramnios.  VSS. Patient stable at this time. Will continue to monitor.

## 2018-10-12 NOTE — OB Triage Note (Signed)
Sterile speculum exam performed by provider. Fern test negative.

## 2018-10-12 NOTE — Discharge Summary (Signed)
Physician Final Progress Note  Patient ID: Carol Small MRN: [id number] DOB/AGE: [birth year] 33 y.o.  Admit date: 10/12/2018 Admitting provider: Malachy Mood, MD Discharge date: 10/12/2018   Admission Diagnoses: leaking fluid  Discharge Diagnoses:  Active Problems:   Indication for care in labor and delivery, antepartum IUP at 36 weeks Reactive NST Membranes intact  History of Present Illness: The patient is a 33 y.o. female G3P1011 at [redacted]w[redacted]d who presents from the office for equivocal test of ruptured membrane. She noticed fluid since about 4 AM this morning and wore a pad that was also wet. She denies urine leakage. She admits positive fetal movement. She feels contractions every 5-10 minutes. She denies vaginal bleeding. She has a scheduled c/section on the 13th of this month.   Patient was placed on monitors. ROM plus sent to lab. Sterile speculum exam for pooling/ferning.   Past Medical History:  Diagnosis Date  . Anxiety   . GERD (gastroesophageal reflux disease)   . Hypertension    NO MEDS-NEVER FOLLOWED BACK UP WITH PCP  . S/P LEEP of cervix     Past Surgical History:  Procedure Laterality Date  . BREAST LUMPECTOMY Left 07/01/2015   Procedure: BREAST LUMPECTOMY;  Surgeon: Christene Lye, MD;  Location: [facility] ORS;  Service: General;  Laterality: Left;  . CESAREAN SECTION  05/11/2011   fetal intolerance  . FOOT SURGERY      No current facility-administered medications on file prior to encounter.    Current Outpatient Medications on File Prior to Encounter  Medication Sig Dispense Refill  . acetaminophen (TYLENOL) 500 MG tablet Take 500-1,000 mg by mouth every 6 (six) hours as needed (for pain.).    Marland Kitchen aspirin EC 81 MG tablet Take 1 tablet (81 mg total) by mouth daily. Take after 12 weeks for prevention of preeclampssia later in pregnancy 300 tablet 2  . calcium carbonate (TUMS - DOSED IN MG ELEMENTAL CALCIUM) 500 MG chewable tablet Chew 1 tablet by mouth 4  (four) times daily as needed for indigestion or heartburn.     . escitalopram (LEXAPRO) 10 MG tablet Take 10 mg by mouth daily.    . famotidine (PEPCID) 20 MG tablet Take 1 tablet (20 mg total) by mouth 2 (two) times daily. (Patient taking differently: Take 20 mg by mouth daily. ) 60 tablet 3  . labetalol (NORMODYNE) 100 MG tablet Take 1 tablet (100 mg total) by mouth at bedtime. (Patient taking differently: Take 100 mg by mouth 2 (two) times daily. ) 60 tablet 6  . Menthol, Topical Analgesic, (BIOFREEZE EX) Apply 1 application topically 4 (four) times daily as needed (pain.).    Marland Kitchen Prenat-FeFmCb-DSS-FA-DHA w/o A (CITRANATAL HARMONY) 27-1-260 MG CAPS Take 1 tablet by mouth daily. 30 capsule 11  . sucralfate (CARAFATE) 1 g tablet Take 1 tablet (1 g total) by mouth 4 (four) times daily. 120 tablet 5  . traZODone (DESYREL) 50 MG tablet Take 1 tablet (50 mg total) by mouth at bedtime as needed for sleep. 30 tablet 1  . acetaminophen-codeine (TYLENOL #3) 300-30 MG tablet Take 1 tablet by mouth every 6 (six) hours as needed for moderate pain. (Patient not taking: Reported on 10/12/2018) 30 tablet 0    Allergies  Allergen Reactions  . Erythromycin Base Other (See Comments)    Stomach cramps  . Adhesive [Tape] Other (See Comments)    Skin becomes raw  . Nickel Rash    Social History   Socioeconomic History  . Marital status:  Single    Spouse name: Not on file  . Number of children: Not on file  . Years of education: Not on file  . Highest education level: Not on file  Occupational History  . Not on file  Social Needs  . Financial resource strain: Not hard at all  . Food insecurity    Worry: Never true    Inability: Never true  . Transportation needs    Medical: No    Non-medical: No  Tobacco Use  . Smoking status: Current Every Day Smoker    Packs/day: 0.25    Years: 13.00    Pack years: 3.25    Types: Cigarettes  . Smokeless tobacco: Never Used  Substance and Sexual Activity  .  Alcohol use: Not Currently    Alcohol/week: 0.0 standard drinks    Comment: occ  . Drug use: No  . Sexual activity: Yes    Birth control/protection: Surgical    Comment: tubal considered  Lifestyle  . Physical activity    Days per week: 0 days    Minutes per session: 0 min  . Stress: Very much  Relationships  . Social connections    Talks on phone: More than three times a week    Gets together: More than three times a week    Attends religious service: Never    Active member of club or organization: No    Attends meetings of clubs or organizations: Never    Relationship status: Patient refused  . Intimate partner violence    Fear of current or ex partner: No    Emotionally abused: No    Physically abused: No    Forced sexual activity: No  Other Topics Concern  . Not on file  Social History Narrative  . Not on file    Family History  Problem Relation Age of Onset  . Thyroid cancer Maternal Grandmother      Review of Systems  Constitutional: Negative.   HENT: Negative.   Eyes: Negative.   Respiratory: Negative.   Cardiovascular: Negative.   Gastrointestinal: Negative.   Genitourinary: Negative.   Musculoskeletal: Negative.   Skin: Negative.   Neurological: Negative.   Endo/Heme/Allergies: Negative.   Psychiatric/Behavioral: Negative.      Physical Exam: BP 131/83 (BP Location: Right Arm)   Pulse 89   Temp 98.5 F (36.9 C) (Oral)   Resp 18   Ht 5\' 2"  (1.575 m)   Wt 86.2 kg   LMP 01/26/2018 (Exact Date)   BMI 34.75 kg/m   Constitutional: Well nourished, well developed female in no acute distress.  HEENT: normal Skin: Warm and dry.  Cardiovascular: Regular rate and rhythm.   Extremity: no edema  Respiratory: Clear to auscultation bilateral. Normal respiratory effort Abdomen: FHT present Back: no CVAT Neuro: DTRs 2+, Cranial nerves grossly intact Psych: Alert and Oriented x3. No memory deficits. Normal mood and affect.  MS: normal gait, normal  bilateral lower extremity ROM/strength/stability.  Pelvic exam: (female chaperone present) is not limited by body habitus EGBUS: within normal limits Vagina: within normal limits and with normal mucosa, normal discharge  Cervix: sterile speculum exam: negative pooling, negative ferning, appears to be closed Toco: every 5-7 minutes Fetal well being: 135 bpm, moderate variability, +accelerations, -decelerations  Consults: None  Significant Findings/ Diagnostic Studies: labs:  Results for SARAHMARIE, LEAVEY" (MRN [id number]) as of 10/12/2018 16:48  Ref. Range 10/12/2018 15:19  Rom Plus Unknown NEGATIVE    Procedures: NST  Hospital Course:  The patient was admitted to Labor and Delivery Triage for observation.   Discharge Condition: good  Disposition: Discharge disposition: 01-Home or Self Care      Diet: Regular diet  Discharge Activity: Activity as tolerated  Discharge Instructions    Discharge activity:  No Restrictions   Complete by: As directed    Discharge diet:  No restrictions   Complete by: As directed    Fetal Kick Count:  Lie on our left side for one hour after a meal, and count the number of times your baby kicks.  If it is less than 5 times, get up, move around and drink some juice.  Repeat the test 30 minutes later.  If it is still less than 5 kicks in an hour, notify your doctor.   Complete by: As directed    No sexual activity restrictions   Complete by: As directed    Notify physician for a general feeling that "something is not right"   Complete by: As directed    Notify physician for increase or change in vaginal discharge   Complete by: As directed    Notify physician for intestinal cramps, with or without diarrhea, sometimes described as "gas pain"   Complete by: As directed    Notify physician for leaking of fluid   Complete by: As directed    Notify physician for low, dull backache, unrelieved by heat or Tylenol   Complete by: As directed     Notify physician for menstrual like cramps   Complete by: As directed    Notify physician for pelvic pressure   Complete by: As directed    Notify physician for uterine contractions.  These may be painless and feel like the uterus is tightening or the baby is  "balling up"   Complete by: As directed    Notify physician for vaginal bleeding   Complete by: As directed    PRETERM LABOR:  Includes any of the follwing symptoms that occur between 20 - [redacted] weeks gestation.  If these symptoms are not stopped, preterm labor can result in preterm delivery, placing your baby at risk   Complete by: As directed      Allergies as of 10/12/2018      Reactions   Erythromycin Base Other (See Comments)   Stomach cramps   Adhesive [tape] Other (See Comments)   Skin becomes raw   Nickel Rash      Medication List    STOP taking these medications   acetaminophen-codeine 300-30 MG tablet Commonly known as: TYLENOL #3   aspirin EC 81 MG tablet     TAKE these medications   acetaminophen 500 MG tablet Commonly known as: TYLENOL Take 500-1,000 mg by mouth every 6 (six) hours as needed (for pain.).   BIOFREEZE EX Apply 1 application topically 4 (four) times daily as needed (pain.).   calcium carbonate 500 MG chewable tablet Commonly known as: TUMS - dosed in mg elemental calcium Chew 1 tablet by mouth 4 (four) times daily as needed for indigestion or heartburn.   CitraNatal Harmony 27-1-260 MG Caps Take 1 tablet by mouth daily.   escitalopram 10 MG tablet Commonly known as: LEXAPRO Take 10 mg by mouth daily.   famotidine 20 MG tablet Commonly known as: PEPCID Take 1 tablet (20 mg total) by mouth 2 (two) times daily. What changed: when to take this   labetalol 100 MG tablet Commonly known as: NORMODYNE Take 1 tablet (100 mg total) by mouth at bedtime.  What changed: when to take this   sucralfate 1 g tablet Commonly known as: Carafate Take 1 tablet (1 g total) by mouth 4 (four) times  daily.   traZODone 50 MG tablet Commonly known as: DESYREL Take 1 tablet (50 mg total) by mouth at bedtime as needed for sleep.      [facility]. Go to.   Specialty: Obstetrics and Gynecology Why: regular scheduled appointment Contact information: [address] [place] [id number] [phone]          Total time spent taking care of this patient: 20 minutes  Signed: Rod Can, CNM  10/12/2018, 4:31 PM

## 2018-10-12 NOTE — Progress Notes (Signed)
Around 4 am had a lot of fluid come out, nothing after that, little lower abdominal cramping that comes and go

## 2018-10-17 ENCOUNTER — Encounter
Admission: RE | Admit: 2018-10-17 | Discharge: 2018-10-17 | Disposition: A | Discharge status: Home / Self Care | Payer: Medicaid Other | Source: Ambulatory Visit | Attending: Obstetrics and Gynecology | Admitting: Obstetrics and Gynecology

## 2018-10-17 ENCOUNTER — Ambulatory Visit
Admission: RE | Admit: 2018-10-17 | Discharge: 2018-10-17 | Disposition: A | Discharge status: Home / Self Care | Payer: Medicaid Other | Source: Ambulatory Visit | Attending: Obstetrics and Gynecology | Admitting: Obstetrics and Gynecology

## 2018-10-17 ENCOUNTER — Ambulatory Visit (INDEPENDENT_AMBULATORY_CARE_PROVIDER_SITE_OTHER): Payer: Medicaid Other | Admitting: Obstetrics and Gynecology

## 2018-10-17 ENCOUNTER — Encounter: Payer: Self-pay | Admitting: Obstetrics and Gynecology

## 2018-10-17 ENCOUNTER — Other Ambulatory Visit: Payer: Self-pay

## 2018-10-17 VITALS — BP 142/96 | Wt 195.0 lb

## 2018-10-17 DIAGNOSIS — O99213 Obesity complicating pregnancy, third trimester: Secondary | ICD-10-CM

## 2018-10-17 DIAGNOSIS — O99210 Obesity complicating pregnancy, unspecified trimester: Secondary | ICD-10-CM

## 2018-10-17 DIAGNOSIS — Z3A36 36 weeks gestation of pregnancy: Secondary | ICD-10-CM

## 2018-10-17 DIAGNOSIS — R1011 Right upper quadrant pain: Secondary | ICD-10-CM

## 2018-10-17 DIAGNOSIS — Z20828 Contact with and (suspected) exposure to other viral communicable diseases: Secondary | ICD-10-CM | POA: Diagnosis not present

## 2018-10-17 DIAGNOSIS — O10913 Unspecified pre-existing hypertension complicating pregnancy, third trimester: Secondary | ICD-10-CM

## 2018-10-17 DIAGNOSIS — Z01812 Encounter for preprocedural laboratory examination: Secondary | ICD-10-CM | POA: Diagnosis present

## 2018-10-17 DIAGNOSIS — O403XX0 Polyhydramnios, third trimester, not applicable or unspecified: Secondary | ICD-10-CM

## 2018-10-17 DIAGNOSIS — O34219 Maternal care for unspecified type scar from previous cesarean delivery: Secondary | ICD-10-CM

## 2018-10-17 DIAGNOSIS — O26893 Other specified pregnancy related conditions, third trimester: Secondary | ICD-10-CM

## 2018-10-17 DIAGNOSIS — O0993 Supervision of high risk pregnancy, unspecified, third trimester: Secondary | ICD-10-CM

## 2018-10-17 DIAGNOSIS — O10919 Unspecified pre-existing hypertension complicating pregnancy, unspecified trimester: Secondary | ICD-10-CM

## 2018-10-17 DIAGNOSIS — O0990 Supervision of high risk pregnancy, unspecified, unspecified trimester: Secondary | ICD-10-CM

## 2018-10-17 LAB — SARS CORONAVIRUS 2 (TAT 6-24 HRS): SARS Coronavirus 2: NEGATIVE

## 2018-10-17 NOTE — Patient Instructions (Addendum)
Your procedure is scheduled on: 10/20/18 Report to  [facility] or Emergency Dept at scheduled time. .  Remember: Instructions that are not followed completely may result in serious medical risk, up to and including death, or upon the discretion of your surgeon and anesthesiologist your surgery may need to be rescheduled.     _X__ 1. Do not eat food after midnight the night before your procedure.                 No gum chewing or hard candies. You may drink clear liquids up to 2 hours                 before you are scheduled to arrive for your surgery- DO not drink clear                 liquids within 2 hours of the start of your surgery.                 Clear Liquids include:  water, apple juice without pulp, clear carbohydrate                 drink such as Clearfast or Gatorade, Black Coffee or Tea (Do not add                 anything to coffee or tea). Diabetics water only  __X__2.  On the morning of surgery brush your teeth with toothpaste and water, you                 may rinse your mouth with mouthwash if you wish.  Do not swallow any              toothpaste of mouthwash.     _X__ 3.  No Alcohol for 24 hours before or after surgery.   _X__ 4.  Do Not Smoke or use e-cigarettes For 24 Hours Prior to Your Surgery.                 Do not use any chewable tobacco products for at least 6 hours prior to                 surgery.  ____  5.  Bring all medications with you on the day of surgery if instructed.   __X__  6.  Notify your doctor if there is any change in your medical condition      (cold, fever, infections).     Do not wear jewelry, make-up, hairpins, clips or nail polish. Do not wear lotions, powders, or perfumes.  Do not shave 48 hours prior to surgery. Men may shave face and neck. Do not bring valuables to the hospital.    [facility] is not responsible for any belongings or valuables.  Contacts, dentures/partials or body piercings may not be worn into surgery.  Bring a case for your contacts, glasses or hearing aids, a denture cup will be supplied. Leave your suitcase in the car. After surgery it may be brought to your room. For patients admitted to the hospital, discharge time is determined by your treatment team.   Patients discharged the day of surgery will not be allowed to drive home.   Please read over the following fact sheets that you were given:   MRSA Information  __X__ Take these medicines the morning of surgery with A SIP OF WATER:    1. famotidine  2. escitalopram  3. labatolol  4.  5.  6.  ____ Fleet Enema (as directed)   __X__ Use CHG Soap/SAGE wipes as directed avoid using on breasts  ____ Use inhalers on the day of surgery  ____ Stop metformin/Janumet/Farxiga 2 days prior to surgery    ____ Take 1/2 of usual insulin dose the night before surgery. No insulin the morning          of surgery.   ____ Stop Blood Thinners Coumadin/Plavix/Xarelto/Pleta/Pradaxa/Eliquis/Effient/Aspirin  on   Or contact your Surgeon, Cardiologist or Medical Doctor regarding  ability to stop your blood thinners  __X__ Stop Anti-inflammatories 7 days before surgery such as Advil, Ibuprofen, Motrin,  BC or Goodies Powder, Naprosyn, Naproxen, Aleve, Aspirin    __X__ Stop all herbal supplements, fish oil or vitamin E until after surgery.    ____ Bring C-Pap to the hospital.       Telephone instructions provided. Patient verbalized understanding. Patient to confirm answers to questions with L&D staff at time of covid testing.

## 2018-10-17 NOTE — Progress Notes (Signed)
Obstetric H&P   Chief Complaint: Schedule C-section  Prenatal Care Provider: [facility]  History of Present Illness: 33 y.o. G3P1011 [redacted]w[redacted]d by 11/09/2018, by Ultrasound presenting to L&D for scheduled repeat cesarean section. +FM, irregular contraction, no LOF, no VB. Patient pregnancy complicated by [facility] on labetalol.  She was noted to have severe polyhydramnios on routine third trimester growth scan.  Anatomy appears normal and source appears idiopathic.  She did have an elevated 1-hr but a normal 3-hr OGTT.  Fetal growth otherwise normal.    Pregravid weight 171 lb (77.6 kg) Total Weight Gain 19 lb (8.618 kg)  pregnancy Problems (from 03/23/18 to present)    Problem Noted Resolved   Polyhydramnios in third trimester 09/23/2018 by Malachy Mood, MD No   History of cesarean section complicating pregnancy 09/03/3149 by Malachy Mood, MD No   Chronic hypertension affecting pregnancy 06/09/2018 by Will Bonnet, MD No   Overview Signed 06/09/2018 11:56 AM by Will Bonnet, MD    [ ]  Aspirin 81 mg daily after 12 weeks; discontinue after 36 weeks [ ]  baseline labs with CBC, CMP, urine protein/creatinine ratio [ ]  no BP meds unless BPs become elevated [ ]  ultrasound for growth at 28, 32, 36 weeks [ ]  Aspirin 81 mg daily after 12 weeks; discontinue after 36 weeks [ ]  Baseline EKG   Current antihypertensives:  Labetalol 100 mg bid  Baseline and surveillance labs (pulled in from [facility], refresh links as needed)  Lab Results  Component Value Date   PLT 277 04/12/2018   CREATININE 0.57 04/12/2018   AST 17 04/12/2018   ALT 13 04/12/2018    Antenatal Testing CHTN - O10.919  Group I  BP < 140/90, no preeclampsia, AGA,  nml AFV, +/- meds    Group II BP > 140/90, on meds, no preeclampsia, AGA, nml AFV  20-28-34-38  20-24-28-32-35-38  32//2 x wk  28//BPP wkly then 32//2 x wk  40 no meds; 39 meds  PRN or 37  Pre-eclampsia  GHTN - O13.9/Preeclampsia without severe features  -  O14.00   Preeclampsia with severe features - O14.10  Q 3-4wks  Q 2 wks  28//BPP wkly then 32//2 x wk  Inpatient  37  PRN or 34         Supervision of high risk pregnancy, antepartum 03/23/2018 by Rod Can, CNM No   Overview Addendum 09/25/2018  4:41 AM by Dalia Heading, [place] Prenatal Labs  Dating  5wk [place] Blood type: AB/Positive/-- (02/04 1650)   Genetic Screen  NIPS: normal xy Antibody:Negative (02/04 1650)  Anatomic [place] complete Rubella: 12.40 (02/04 1650) Varicella: Immune  GTT Early:99 Third trimester: 158 3-hr 83, 162, 120, 113 RPR: Non Reactive (02/04 1650)   Rhogam  not needed HBsAg: Negative (02/04 1650)   TDaP vaccine   Flu Shot: declines HIV: Non Reactive (02/04 1650)   Baby Food                                GBS: unknown  Contraception BTL Pap: 2019 NIL  CBB     CS/VBAC 2013- desires repeat   Support Person Donyell              Review of Systems: 10 point review of systems negative unless otherwise noted in HPI  Past Medical History: Past Medical History:  Diagnosis Date  . Anxiety   . GERD (gastroesophageal reflux disease)   .  Hypertension    NO MEDS-NEVER FOLLOWED BACK UP WITH PCP  . S/P LEEP of cervix     Past Surgical History: Past Surgical History:  Procedure Laterality Date  . BREAST LUMPECTOMY Left 07/01/2015   Procedure: BREAST LUMPECTOMY;  Surgeon: Christene Lye, MD;  Location: [facility] ORS;  Service: General;  Laterality: Left;  . CESAREAN SECTION  05/11/2011   fetal intolerance  . FOOT SURGERY      Past Obstetric History: # 1 - Date: 2012, Sex: None, Weight: None, GA: None, Delivery: None, Apgar1: None, Apgar5: None, Living: None, Birth Comments: None  # 2 - Date: 05/11/11, Sex: Female, Weight: 6 lb 14 oz (3.118 kg), GA: [redacted]w[redacted]d, Delivery: C-Section, Low Transverse, Apgar1: 9, Apgar5: 9, Living: Living, Birth Comments: None  # 3 - Date: None, Sex: None, Weight: None, GA: None, Delivery: None, Apgar1:  None, Apgar5: None, Living: None, Birth Comments: None   Past Gynecologic History:  Family History: Family History  Problem Relation Age of Onset  . Thyroid cancer Maternal Grandmother     Social History: Social History   Socioeconomic History  . Marital status: Single    Spouse name: Not on file  . Number of children: Not on file  . Years of education: Not on file  . Highest education level: Not on file  Occupational History  . Not on file  Social Needs  . Financial resource strain: Not hard at all  . Food insecurity    Worry: Never true    Inability: Never true  . Transportation needs    Medical: No    Non-medical: No  Tobacco Use  . Smoking status: Current Every Day Smoker    Packs/day: 0.25    Years: 13.00    Pack years: 3.25    Types: Cigarettes  . Smokeless tobacco: Never Used  Substance and Sexual Activity  . Alcohol use: Not Currently    Alcohol/week: 0.0 standard drinks    Comment: occ  . Drug use: No  . Sexual activity: Yes    Birth control/protection: Surgical    Comment: tubal considered  Lifestyle  . Physical activity    Days per week: 0 days    Minutes per session: 0 min  . Stress: Very much  Relationships  . Social connections    Talks on phone: More than three times a week    Gets together: More than three times a week    Attends religious service: Never    Active member of club or organization: No    Attends meetings of clubs or organizations: Never    Relationship status: Patient refused  . Intimate partner violence    Fear of current or ex partner: No    Emotionally abused: No    Physically abused: No    Forced sexual activity: No  Other Topics Concern  . Not on file  Social History Narrative  . Not on file    Medications: Prior to Admission medications   Medication Sig Start Date End Date Taking? Authorizing Provider  acetaminophen (TYLENOL) 500 MG tablet Take 500-1,000 mg by mouth every 6 (six) hours as needed (for pain.).    Yes [provider]  calcium carbonate (TUMS - DOSED IN MG ELEMENTAL CALCIUM) 500 MG chewable tablet Chew 1 tablet by mouth 4 (four) times daily as needed for indigestion or heartburn.    Yes [provider]  escitalopram (LEXAPRO) 10 MG tablet Take 10 mg by mouth daily.   Yes [provider]  famotidine (PEPCID) 20 MG tablet Take 1 tablet (20 mg total) by mouth 2 (two) times daily. Patient taking differently: Take 20 mg by mouth daily.  07/07/18  Yes Rexene Agent, CNM  labetalol (NORMODYNE) 100 MG tablet Take 1 tablet (100 mg total) by mouth at bedtime. Patient taking differently: Take 100 mg by mouth 2 (two) times daily.  08/05/18  Yes Dalia Heading, CNM  Menthol, Topical Analgesic, (BIOFREEZE EX) Apply 1 application topically 4 (four) times daily as needed (pain.).   Yes [provider]  Prenat-FeFmCb-DSS-FA-DHA w/o A (CITRANATAL HARMONY) 27-1-260 MG CAPS Take 1 tablet by mouth daily. 03/30/18  Yes Gae Dry, MD  sucralfate (CARAFATE) 1 g tablet Take 1 tablet (1 g total) by mouth 4 (four) times daily. 10/04/18 10/04/19 Yes Schuman, Stefanie Libel, MD  traZODone (DESYREL) 50 MG tablet Take 1 tablet (50 mg total) by mouth at bedtime as needed for sleep. 09/04/18  Yes Malachy Mood, MD    Allergies: Allergies  Allergen Reactions  . Erythromycin Base Other (See Comments)    Stomach cramps  . Adhesive [Tape] Other (See Comments)    Skin becomes raw  . Nickel Rash    Physical Exam: Vitals: Blood pressure (!) 142/96, weight 195 lb (88.5 kg), last menstrual period 01/26/2018.  Baseline: 140 Variability: moderate Accelerations: present Decelerations: absent The patient was monitored for 30 minutes, fetal heart rate tracing was deemed reactive, category I tracing,  General: NAD HEENT: normocephalic, anicteric Pulmonary: No increased work of breathing Cardiovascular: RRR, distal pulses 2+ Abdomen: Gravid, non-tender Leopolds: vtx  Genitourinary: deferred Extremities: no edema, erythema, or tenderness Neurologic: Grossly intact Psychiatric: mood appropriate, affect full  Labs: No results found for this or any previous visit (from the past 24 hour(s)).  Assessment: 33 y.o. G3P1011 [redacted]w[redacted]d by 11/09/2018, by Ultrasound presenting to schedule C-section  Plan: 1) The patient was counseled regarding risk and benefits to proceeding with Cesarean section to expedite delivery.  Risk of cesarean section were discussed including risk of bleeding and need for potential intraoperative or postoperative blood transfusion with a rate of approximately 5% quoted for all Cesarean sections, risk of injury to adjacent organs including but not limited to bowl and bladder, the need for additional surgical procedures to address such injuries, and the risk of infection.    33 y.o. G3P1011  with undesired fertility, desires permanent sterilization.  Other reversible forms of contraception were discussed with patient; she declines all other modalities. Permanent nature of as well as associated risks of the procedure discussed with patient including but not limited to: risk of regret, permanence of method, bleeding, infection, injury to surrounding organs and need for additional procedures.  Failure risk of 0.5-1% with increased risk of ectopic gestation if pregnancy occurs was also discussed with patient.    2) Fetus - reactive NST  3) PNL - Blood type AB/Positive/-- (02/04 1650) / Anti-bodyscreen Negative (02/04 1650) / Rubella 12.40 (02/04 1650) / Varicella Immune / RPR Non Reactive (06/12 1037) / HBsAg Negative (02/04 1650) / HIV Non Reactive (06/12 1037)  4) Immunization History -  Immunization History  Administered Date(s) Administered  . HPV Quadrivalent 04/02/2006  . Hepatitis A, Adult 12/08/2004  . Hepatitis B, adult 09/27/2000, 11/12/2000, 09/13/2001  . Meningococcal Conjugate 12/08/2004  . Td 09/27/2000  . Tdap 12/08/2004, 09/01/2018     5) Disposition -   Malachy Mood, MD, [place], [place] 10/17/2018, 8:24 AM

## 2018-10-20 ENCOUNTER — Inpatient Hospital Stay: Payer: Medicaid Other | Admitting: Anesthesiology

## 2018-10-20 ENCOUNTER — Other Ambulatory Visit: Payer: Self-pay

## 2018-10-20 ENCOUNTER — Inpatient Hospital Stay
Admission: RE | Admit: 2018-10-20 | Discharge: 2018-10-23 | DRG: 787 | Disposition: A | Discharge status: Home / Self Care | Payer: Medicaid Other | Attending: Obstetrics and Gynecology | Admitting: Obstetrics and Gynecology

## 2018-10-20 ENCOUNTER — Encounter
Admission: RE | Disposition: A | Discharge status: Home / Self Care | Payer: Self-pay | Source: Home / Self Care | Attending: Obstetrics and Gynecology

## 2018-10-20 DIAGNOSIS — O99334 Smoking (tobacco) complicating childbirth: Secondary | ICD-10-CM | POA: Diagnosis present

## 2018-10-20 DIAGNOSIS — Z98891 History of uterine scar from previous surgery: Secondary | ICD-10-CM

## 2018-10-20 DIAGNOSIS — D62 Acute posthemorrhagic anemia: Secondary | ICD-10-CM | POA: Diagnosis not present

## 2018-10-20 DIAGNOSIS — O34211 Maternal care for low transverse scar from previous cesarean delivery: Principal | ICD-10-CM | POA: Diagnosis present

## 2018-10-20 DIAGNOSIS — O403XX0 Polyhydramnios, third trimester, not applicable or unspecified: Secondary | ICD-10-CM | POA: Diagnosis not present

## 2018-10-20 DIAGNOSIS — O34219 Maternal care for unspecified type scar from previous cesarean delivery: Secondary | ICD-10-CM | POA: Diagnosis not present

## 2018-10-20 DIAGNOSIS — O1002 Pre-existing essential hypertension complicating childbirth: Secondary | ICD-10-CM | POA: Diagnosis present

## 2018-10-20 DIAGNOSIS — Z3A37 37 weeks gestation of pregnancy: Secondary | ICD-10-CM

## 2018-10-20 DIAGNOSIS — O9081 Anemia of the puerperium: Secondary | ICD-10-CM | POA: Diagnosis not present

## 2018-10-20 DIAGNOSIS — O1092 Unspecified pre-existing hypertension complicating childbirth: Secondary | ICD-10-CM | POA: Diagnosis not present

## 2018-10-20 DIAGNOSIS — I10 Essential (primary) hypertension: Secondary | ICD-10-CM | POA: Diagnosis present

## 2018-10-20 LAB — CBC
HCT: 29.6 % — ABNORMAL LOW (ref 36.0–46.0)
Hemoglobin: 9.7 g/dL — ABNORMAL LOW (ref 12.0–15.0)
MCH: 30 pg (ref 26.0–34.0)
MCHC: 32.8 g/dL (ref 30.0–36.0)
MCV: 91.6 fL (ref 80.0–100.0)
Platelets: 343 10*3/uL (ref 150–400)
RBC: 3.23 MIL/uL — ABNORMAL LOW (ref 3.87–5.11)
RDW: 14 % (ref 11.5–15.5)
WBC: 14 10*3/uL — ABNORMAL HIGH (ref 4.0–10.5)
nRBC: 0 % (ref 0.0–0.2)

## 2018-10-20 LAB — COMPREHENSIVE METABOLIC PANEL
ALT: 18 U/L (ref 0–44)
AST: 25 U/L (ref 15–41)
Albumin: 2.7 g/dL — ABNORMAL LOW (ref 3.5–5.0)
Alkaline Phosphatase: 151 U/L — ABNORMAL HIGH (ref 38–126)
Anion gap: 11 (ref 5–15)
BUN: 8 mg/dL (ref 6–20)
CO2: 21 mmol/L — ABNORMAL LOW (ref 22–32)
Calcium: 8.7 mg/dL — ABNORMAL LOW (ref 8.9–10.3)
Chloride: 106 mmol/L (ref 98–111)
Creatinine, Ser: 0.73 mg/dL (ref 0.44–1.00)
GFR calc Af Amer: >60 mL/min (ref >60)
GFR calc non Af Amer: >60 mL/min (ref >60)
Glucose, Bld: 84 mg/dL (ref 70–99)
Potassium: 3.2 mmol/L — ABNORMAL LOW (ref 3.5–5.1)
Sodium: 138 mmol/L (ref 135–145)
Total Bilirubin: 0.5 mg/dL (ref 0.3–1.2)
Total Protein: 6.2 g/dL — ABNORMAL LOW (ref 6.5–8.1)

## 2018-10-20 LAB — TYPE AND SCREEN
ABO/RH(D): AB POS
Antibody Screen: NEGATIVE

## 2018-10-20 SURGERY — Surgical Case
Anesthesia: Spinal

## 2018-10-20 MED ORDER — SOD CITRATE-CITRIC ACID 500-334 MG/5ML PO SOLN
30.0000 mL | ORAL | Status: AC
  Administered 2018-10-20: 30 mL via ORAL
  Filled 2018-10-20: qty 30

## 2018-10-20 MED ORDER — DIPHENHYDRAMINE HCL 25 MG PO CAPS
25.0000 mg | ORAL_CAPSULE | Freq: Four times a day (QID) | ORAL | Status: DC | PRN
  Administered 2018-10-21: 25 mg via ORAL
  Filled 2018-10-20: qty 1

## 2018-10-20 MED ORDER — EPHEDRINE SULFATE 50 MG/ML IJ SOLN
INTRAMUSCULAR | Status: DC | PRN
  Administered 2018-10-20: 10 mg via INTRAVENOUS

## 2018-10-20 MED ORDER — LACTATED RINGERS IV SOLN
INTRAVENOUS | Status: DC
  Administered 2018-10-20: 08:00:00 via INTRAVENOUS

## 2018-10-20 MED ORDER — OXYCODONE-ACETAMINOPHEN 5-325 MG PO TABS: 1.0000 | ORAL_TABLET | ORAL | Status: DC | PRN

## 2018-10-20 MED ORDER — PHENYLEPHRINE HCL (PRESSORS) 10 MG/ML IV SOLN
INTRAVENOUS | Status: AC
  Filled 2018-10-20: qty 1

## 2018-10-20 MED ORDER — FENTANYL CITRATE (PF) 100 MCG/2ML IJ SOLN
INTRAMUSCULAR | Status: DC | PRN
  Administered 2018-10-20: 15 ug via INTRAVENOUS

## 2018-10-20 MED ORDER — ONDANSETRON HCL 4 MG/2ML IJ SOLN
INTRAMUSCULAR | Status: AC
  Filled 2018-10-20: qty 2

## 2018-10-20 MED ORDER — LIDOCAINE HCL (PF) 1 % IJ SOLN
INTRAMUSCULAR | Status: DC | PRN
  Administered 2018-10-20: 3 mL via SUBCUTANEOUS

## 2018-10-20 MED ORDER — ENOXAPARIN SODIUM 40 MG/0.4ML ~~LOC~~ SOLN
40.0000 mg | SUBCUTANEOUS | Status: DC
  Administered 2018-10-21 – 2018-10-23 (×3): 40 mg via SUBCUTANEOUS
  Filled 2018-10-20 (×3): qty 0.4

## 2018-10-20 MED ORDER — CEFAZOLIN SODIUM-DEXTROSE 2-4 GM/100ML-% IV SOLN
2.0000 g | INTRAVENOUS | Status: AC
  Administered 2018-10-20: 2 g via INTRAVENOUS
  Filled 2018-10-20: qty 100

## 2018-10-20 MED ORDER — NALOXONE HCL 0.4 MG/ML IJ SOLN: 0.4000 mg | INTRAMUSCULAR | Status: DC | PRN

## 2018-10-20 MED ORDER — CARBOPROST TROMETHAMINE 250 MCG/ML IM SOLN
INTRAMUSCULAR | Status: AC
  Filled 2018-10-20: qty 1

## 2018-10-20 MED ORDER — EPHEDRINE SULFATE 50 MG/ML IJ SOLN
INTRAMUSCULAR | Status: AC
  Filled 2018-10-20: qty 1

## 2018-10-20 MED ORDER — SENNOSIDES-DOCUSATE SODIUM 8.6-50 MG PO TABS
2.0000 | ORAL_TABLET | ORAL | Status: DC
  Administered 2018-10-21 – 2018-10-22 (×2): 2 via ORAL
  Filled 2018-10-20 (×3): qty 2

## 2018-10-20 MED ORDER — LACTATED RINGERS IV SOLN: INTRAVENOUS | Status: DC

## 2018-10-20 MED ORDER — KETOROLAC TROMETHAMINE 30 MG/ML IJ SOLN
30.0000 mg | Freq: Four times a day (QID) | INTRAMUSCULAR | Status: AC | PRN
  Administered 2018-10-20 (×2): 30 mg via INTRAVENOUS
  Filled 2018-10-20 (×2): qty 1

## 2018-10-20 MED ORDER — COCONUT OIL OIL: 1.0000 "application " | TOPICAL_OIL | Status: DC | PRN

## 2018-10-20 MED ORDER — MORPHINE SULFATE (PF) 0.5 MG/ML IJ SOLN
INTRAMUSCULAR | Status: DC | PRN
  Administered 2018-10-20: .1 mg via EPIDURAL

## 2018-10-20 MED ORDER — ACETAMINOPHEN 500 MG PO TABS
1000.0000 mg | ORAL_TABLET | Freq: Four times a day (QID) | ORAL | Status: AC
  Administered 2018-10-20 – 2018-10-21 (×4): 1000 mg via ORAL
  Filled 2018-10-20 (×4): qty 2

## 2018-10-20 MED ORDER — MISOPROSTOL 200 MCG PO TABS
ORAL_TABLET | ORAL | Status: AC
  Filled 2018-10-20: qty 5

## 2018-10-20 MED ORDER — MENTHOL 3 MG MT LOZG
1.0000 | LOZENGE | OROMUCOSAL | Status: DC | PRN
  Filled 2018-10-20: qty 9

## 2018-10-20 MED ORDER — SIMETHICONE 80 MG PO CHEW: 80.0000 mg | CHEWABLE_TABLET | ORAL | Status: DC | PRN

## 2018-10-20 MED ORDER — KETOROLAC TROMETHAMINE 30 MG/ML IJ SOLN: 30.0000 mg | Freq: Four times a day (QID) | INTRAMUSCULAR | Status: AC | PRN

## 2018-10-20 MED ORDER — BUPIVACAINE HCL (PF) 0.5 % IJ SOLN: INTRAMUSCULAR | Status: DC | PRN

## 2018-10-20 MED ORDER — LABETALOL HCL 100 MG PO TABS
ORAL_TABLET | ORAL | Status: AC
  Filled 2018-10-20: qty 1

## 2018-10-20 MED ORDER — FENTANYL CITRATE (PF) 100 MCG/2ML IJ SOLN
INTRAMUSCULAR | Status: AC
  Filled 2018-10-20: qty 2

## 2018-10-20 MED ORDER — BUPIVACAINE 0.25 % ON-Q PUMP DUAL CATH 400 ML
400.0000 mL | INJECTION | Status: DC
  Filled 2018-10-20: qty 400

## 2018-10-20 MED ORDER — BUPIVACAINE HCL 0.5 % IJ SOLN
15.0000 mL | INTRAMUSCULAR | Status: DC
  Filled 2018-10-20: qty 15

## 2018-10-20 MED ORDER — NALBUPHINE HCL 10 MG/ML IJ SOLN: 5.0000 mg | INTRAMUSCULAR | Status: DC | PRN

## 2018-10-20 MED ORDER — ONDANSETRON HCL 4 MG/2ML IJ SOLN
INTRAMUSCULAR | Status: DC | PRN
  Administered 2018-10-20: 4 mg via INTRAVENOUS

## 2018-10-20 MED ORDER — WITCH HAZEL-GLYCERIN EX PADS: 1.0000 "application " | MEDICATED_PAD | CUTANEOUS | Status: DC | PRN

## 2018-10-20 MED ORDER — DIBUCAINE (PERIANAL) 1 % EX OINT: 1.0000 "application " | TOPICAL_OINTMENT | CUTANEOUS | Status: DC | PRN

## 2018-10-20 MED ORDER — PHENYLEPHRINE HCL (PRESSORS) 10 MG/ML IV SOLN
INTRAVENOUS | Status: DC | PRN
  Administered 2018-10-20: 100 ug via INTRAVENOUS

## 2018-10-20 MED ORDER — PRENATAL MULTIVITAMIN CH
1.0000 | ORAL_TABLET | Freq: Every day | ORAL | Status: DC
  Administered 2018-10-20 – 2018-10-23 (×4): 1 via ORAL
  Filled 2018-10-20 (×4): qty 1

## 2018-10-20 MED ORDER — FENTANYL CITRATE (PF) 100 MCG/2ML IJ SOLN
25.0000 ug | INTRAMUSCULAR | Status: DC | PRN
  Administered 2018-10-20: 25 ug via INTRAVENOUS
  Filled 2018-10-20: qty 2

## 2018-10-20 MED ORDER — SIMETHICONE 80 MG PO CHEW
80.0000 mg | CHEWABLE_TABLET | ORAL | Status: DC
  Filled 2018-10-20: qty 1

## 2018-10-20 MED ORDER — ONDANSETRON HCL 4 MG/2ML IJ SOLN: 4.0000 mg | Freq: Three times a day (TID) | INTRAMUSCULAR | Status: DC | PRN

## 2018-10-20 MED ORDER — NALBUPHINE HCL 10 MG/ML IJ SOLN
5.0000 mg | INTRAMUSCULAR | Status: DC | PRN
  Administered 2018-10-20 – 2018-10-21 (×2): 5 mg via INTRAVENOUS
  Filled 2018-10-20 (×2): qty 1

## 2018-10-20 MED ORDER — LABETALOL HCL 100 MG PO TABS
100.0000 mg | ORAL_TABLET | Freq: Two times a day (BID) | ORAL | Status: DC
  Administered 2018-10-20 – 2018-10-21 (×4): 100 mg via ORAL
  Filled 2018-10-20 (×3): qty 1

## 2018-10-20 MED ORDER — OXYTOCIN 40 UNITS IN NORMAL SALINE INFUSION - SIMPLE MED
2.5000 [IU]/h | INTRAVENOUS | Status: AC
  Administered 2018-10-20: 2.5 [IU]/h via INTRAVENOUS
  Filled 2018-10-20: qty 1000

## 2018-10-20 MED ORDER — BUPIVACAINE IN DEXTROSE 0.75-8.25 % IT SOLN
INTRATHECAL | Status: DC | PRN
  Administered 2018-10-20: 1.4 mL via INTRATHECAL

## 2018-10-20 MED ORDER — DIPHENHYDRAMINE HCL 50 MG/ML IJ SOLN: 12.5000 mg | INTRAMUSCULAR | Status: DC | PRN

## 2018-10-20 MED ORDER — ZOLPIDEM TARTRATE 5 MG PO TABS: 5.0000 mg | ORAL_TABLET | Freq: Every evening | ORAL | Status: DC | PRN

## 2018-10-20 MED ORDER — IBUPROFEN 800 MG PO TABS
800.0000 mg | ORAL_TABLET | Freq: Three times a day (TID) | ORAL | Status: DC
  Administered 2018-10-21 – 2018-10-23 (×7): 800 mg via ORAL
  Filled 2018-10-20 (×7): qty 1

## 2018-10-20 MED ORDER — OXYTOCIN 40 UNITS IN NORMAL SALINE INFUSION - SIMPLE MED
INTRAVENOUS | Status: DC | PRN
  Administered 2018-10-20: 500 mL via INTRAVENOUS

## 2018-10-20 MED ORDER — MORPHINE SULFATE (PF) 0.5 MG/ML IJ SOLN
INTRAMUSCULAR | Status: AC
  Filled 2018-10-20: qty 10

## 2018-10-20 MED ORDER — BUPIVACAINE HCL (PF) 0.5 % IJ SOLN
INTRAMUSCULAR | Status: AC
  Filled 2018-10-20: qty 30

## 2018-10-20 MED ORDER — SODIUM CHLORIDE 0.9% FLUSH: 3.0000 mL | INTRAVENOUS | Status: DC | PRN

## 2018-10-20 MED ORDER — ONDANSETRON HCL 4 MG/2ML IJ SOLN: 4.0000 mg | Freq: Once | INTRAMUSCULAR | Status: DC | PRN

## 2018-10-20 MED ORDER — DIPHENHYDRAMINE HCL 25 MG PO CAPS
25.0000 mg | ORAL_CAPSULE | ORAL | Status: DC | PRN
  Administered 2018-10-20: 25 mg via ORAL
  Filled 2018-10-20: qty 1

## 2018-10-20 MED ORDER — ESCITALOPRAM OXALATE 10 MG PO TABS
10.0000 mg | ORAL_TABLET | Freq: Every day | ORAL | Status: DC
  Administered 2018-10-20 – 2018-10-23 (×4): 10 mg via ORAL
  Filled 2018-10-20 (×4): qty 1

## 2018-10-20 MED ORDER — SODIUM CHLORIDE 0.9 % IV SOLN
INTRAVENOUS | Status: DC | PRN
  Administered 2018-10-20: 40 ug/min via INTRAVENOUS

## 2018-10-20 MED ORDER — SIMETHICONE 80 MG PO CHEW
80.0000 mg | CHEWABLE_TABLET | Freq: Three times a day (TID) | ORAL | Status: DC
  Administered 2018-10-20 – 2018-10-23 (×9): 80 mg via ORAL
  Filled 2018-10-20 (×9): qty 1

## 2018-10-20 SURGICAL SUPPLY — 30 items
BAG COUNTER SPONGE EZ (MISCELLANEOUS) ×2 IMPLANT
CANISTER SUCT 3000ML PPV (MISCELLANEOUS) ×3 IMPLANT
CATH KIT ON-Q SILVERSOAK 5IN (CATHETERS) ×6 IMPLANT
CHLORAPREP W/TINT 26 (MISCELLANEOUS) ×6 IMPLANT
CLOSURE WOUND 1/2 X4 (GAUZE/BANDAGES/DRESSINGS) ×1
COUNTER SPONGE BAG EZ (MISCELLANEOUS) ×1
DERMABOND ADVANCED (GAUZE/BANDAGES/DRESSINGS) ×2
DERMABOND ADVANCED .7 DNX12 (GAUZE/BANDAGES/DRESSINGS) ×1 IMPLANT
DRSG OPSITE POSTOP 4X10 (GAUZE/BANDAGES/DRESSINGS) ×3 IMPLANT
DRSG TELFA 3X8 NADH (GAUZE/BANDAGES/DRESSINGS) ×3 IMPLANT
ELECT CAUTERY BLADE 6.4 (BLADE) IMPLANT
ELECT REM PT RETURN 9FT ADLT (ELECTROSURGICAL) ×3
ELECTRODE REM PT RTRN 9FT ADLT (ELECTROSURGICAL) ×1 IMPLANT
GAUZE SPONGE 4X4 12PLY STRL (GAUZE/BANDAGES/DRESSINGS) ×3 IMPLANT
GLOVE BIO SURGEON STRL SZ7 (GLOVE) ×15 IMPLANT
GLOVE INDICATOR 7.5 STRL GRN (GLOVE) ×15 IMPLANT
GOWN STRL REUS W/ TWL LRG LVL3 (GOWN DISPOSABLE) ×3 IMPLANT
GOWN STRL REUS W/TWL LRG LVL3 (GOWN DISPOSABLE) ×6
NS IRRIG 1000ML POUR BTL (IV SOLUTION) ×3 IMPLANT
PACK C SECTION AR (MISCELLANEOUS) ×3 IMPLANT
PAD OB MATERNITY 4.3X12.25 (PERSONAL CARE ITEMS) ×3 IMPLANT
PAD PREP 24X41 OB/GYN DISP (PERSONAL CARE ITEMS) ×3 IMPLANT
PENCIL SMOKE ULTRAEVAC 22 CON (MISCELLANEOUS) ×3 IMPLANT
SPONGE LAP 18X18 RF (DISPOSABLE) ×3 IMPLANT
STRIP CLOSURE SKIN 1/2X4 (GAUZE/BANDAGES/DRESSINGS) ×2 IMPLANT
SUT MNCRL AB 4-0 PS2 18 (SUTURE) ×6 IMPLANT
SUT PDS AB 1 TP1 96 (SUTURE) ×6 IMPLANT
SUT VIC AB 0 CTX 36 (SUTURE) ×4
SUT VIC AB 0 CTX36XBRD ANBCTRL (SUTURE) ×2 IMPLANT
SUT VIC AB 2-0 CT1 36 (SUTURE) ×3 IMPLANT

## 2018-10-20 NOTE — Discharge Summary (Addendum)
Obstetric Discharge Summary Reason for Admission: cesarean section Prenatal Procedures: none Intrapartum Procedures: cesarean: low cervical, transverse Postpartum Procedures: none Complications-Operative and Postpartum: none Hemoglobin  Date Value Ref Range Status  10/21/2018 8.4 (L) 12.0 - 15.0 g/dL Final  09/30/2018 10.3 (L) 11.1 - 15.9 g/dL Final   HCT  Date Value Ref Range Status  10/21/2018 25.2 (L) 36.0 - 46.0 % Final   Hematocrit  Date Value Ref Range Status  09/30/2018 30.4 (L) 34.0 - 46.6 % Final    Physical Exam:  General: alert, cooperative and no distress Lochia: appropriate Uterine Fundus: firm Incision: healing well, no significant drainage, no dehiscence, no significant erythema DVT Evaluation: No evidence of DVT seen on physical exam. Negative Homan's sign.  Discharge Diagnoses: Term Pregnancy-delivered and Hypertension and Prior Cesarean  Discharge Information: Date: 10/23/2018 Activity: pelvic rest Diet: routine Allergies as of 10/23/2018      Reactions   Erythromycin Base Other (See Comments)   Stomach cramps   Adhesive [tape] Other (See Comments)   Skin becomes raw   Nickel Rash      Medication List    TAKE these medications   acetaminophen 500 MG tablet Commonly known as: TYLENOL Take 500-1,000 mg by mouth every 6 (six) hours as needed (for pain.).   BIOFREEZE EX Apply 1 application topically 4 (four) times daily as needed (pain.).   calcium carbonate 500 MG chewable tablet Commonly known as: TUMS - dosed in mg elemental calcium Chew 1 tablet by mouth 4 (four) times daily as needed for indigestion or heartburn.   CitraNatal Harmony 27-1-260 MG Caps Take 1 tablet by mouth daily.   escitalopram 10 MG tablet Commonly known as: LEXAPRO Take 10 mg by mouth daily.   famotidine 20 MG tablet Commonly known as: PEPCID Take 1 tablet (20 mg total) by mouth 2 (two) times daily. What changed: when to take this   labetalol 200 MG  tablet Commonly known as: NORMODYNE Take 1 tablet (200 mg total) by mouth 2 (two) times daily. What changed:   medication strength  how much to take  when to take this   labetalol 300 MG tablet Commonly known as: NORMODYNE Take 1 tablet (300 mg total) by mouth 2 (two) times daily. What changed: You were already taking a medication with the same name, and this prescription was added. Make sure you understand how and when to take each.   oxyCODONE-acetaminophen 5-325 MG tablet Commonly known as: PERCOCET/ROXICET Take 1-2 tablets by mouth every 6 (six) hours as needed.   sucralfate 1 g tablet Commonly known as: Carafate Take 1 tablet (1 g total) by mouth 4 (four) times daily.   traZODone 50 MG tablet Commonly known as: DESYREL Take 1 tablet (50 mg total) by mouth at bedtime as needed for sleep.       Condition: stable Discharge to: home Follow-up Information    Malachy Mood, MD In 1 week.   Specialty: Obstetrics and Gynecology Why: For wound re-check Contact information: [address] [place] [address] [phone]           Newborn Data: Live born female  Birth Weight: 7 lb 3 oz (3260 g) APGAR: 68, 47  Newborn Delivery   Birth date/time: 10/20/2018 10:23:00 Delivery type: C-Section, Low Transverse Trial of labor: No C-section categorization: Repeat      Home with mother.  Hoyt Koch 10/23/2018, 8:33 AM

## 2018-10-20 NOTE — Transfer of Care (Signed)
Immediate Anesthesia Transfer of Care Note  Patient: Carol Small  Procedure(s) Performed: CESAREAN SECTION (N/A )  Patient Location: PACU and Mother/Baby  Anesthesia Type:Spinal  Level of Consciousness: awake, alert , oriented and patient cooperative  Airway & Oxygen Therapy: Patient Spontanous Breathing  Post-op Assessment: Report given to RN and Post -op Vital signs reviewed and stable  Post vital signs: Reviewed and stable  Last Vitals:  Vitals Value Taken Time  BP 151/84 10/20/18 1119  Temp 97.4 10/20/18 1119  Pulse 74 10/20/18 1119  Resp 16 10/20/18 1119  SpO2 100 10/20/18 1119    Last Pain:  Vitals:   10/20/18 0721  TempSrc:   PainSc: 0-No pain         Complications: No apparent anesthesia complications

## 2018-10-20 NOTE — Anesthesia Procedure Notes (Signed)
Spinal  Patient location during procedure: OR Start time: 10/20/2018 10:00 AM End time: 10/20/2018 10:03 AM Staffing Anesthesiologist: Gunnar Bulla, MD Resident/CRNA: Eben Burow, CRNA Performed: resident/CRNA  Preanesthetic Checklist Completed: patient identified, site marked, surgical consent, pre-op evaluation, timeout performed, IV checked, risks and benefits discussed and monitors and equipment checked Spinal Block Patient position: sitting Prep: Betadine and site prepped and draped Patient monitoring: heart rate, continuous pulse ox and blood pressure Approach: midline Location: L3-4 Injection technique: single-shot Needle Needle type: Pencan  Needle gauge: 24 G Needle length: 10 cm Assessment Sensory level: T6

## 2018-10-20 NOTE — Anesthesia Post-op Follow-up Note (Signed)
Anesthesia QCDR form completed.        

## 2018-10-20 NOTE — Lactation Note (Signed)
This note was copied from a baby's chart. Lactation Consultation Note  Patient Name: Carol Small [id number] Date: 10/20/2018 Reason for consult: Initial assessment   Maternal Data Formula Feeding for Exclusion: Yes Reason for exclusion: Mother's choice to formula and breast feed on admission  Feeding Feeding Type: Breast Fed  Interventions Interventions: Breast feeding basics reviewed Mother had bottle fed stating she was "tired after surgery". Encouraged to allow [place] to help her breast feed. Consult Status Consult Status: Follow-up Follow-up type: In-patient    Daryel November 10/20/2018, 1:01 PM

## 2018-10-20 NOTE — Op Note (Addendum)
Preoperative Diagnosis: 1) 33 y.o. G3P1011 at [redacted]w[redacted]d 2) History of prior cesarean section 3) Severe polyhydramnios 4) Chronic hypertension on labetalol 5) Body mass index is 35.74 kg/m.   Postoperative Diagnosis: 1) 33 y.o. [id number] at [redacted]w[redacted]d 2) History of prior cesarean section 3) Severe polyhydramnios 4) Chronic hypertension on labetalol 5) Body mass index is 35.74 kg/m.  Operation Performed: Repeat low transverse C-section via pfannenstiel skin incision  Indiciation: Severe polyhydramnios, symptomatic.  History of prior cesarean  Anesthesia: Spinal  Primary Surgeon: Malachy Mood, MD   Assistant: Adrian Prows, MD this surgery required a high level surgical assistant with none other readily available  Preoperative Antibiotics: 2g Ancf  Estimated Blood Loss: 500 mL  IV Fluids: 946mL  Urine Output:: 43mL  Drains or Tubes: Foley to gravity drainage, ON-Q catheter system  Implants: none  Specimens Removed: none  Complications: none  Intraoperative Findings:  Normal tubes ovaries and uterus.  Delivery resulted in the birth of a liveborn female, APGAR (1 MIN): 8   APGAR (5 MINS): 9, weight  7lbs 3oz  Patient Condition:stable  Procedure in Detail:  Patient was taken to the operating room were she was administered regional anesthesia.  She was positioned in the supine position, prepped and draped in the  Usual sterile fashion.  Prior to proceeding with the case a time out was performed and the level of anesthetic was checked and noted to be adequate.  Utilizing the scalpel a pfannenstiel skin incision was made 2cm above the pubic symphysis utilizing the patient's pre-existing scar and carried down sharply to the the level of the rectus fascia.  The fascia was incised in the midline using the scalpel and then extended using mayo scissors.  The superior border of the rectus fascia was grasped with two Kocher clamps and the underlying rectus muscles were dissected of the  fascia using blunt dissection.  The median raphae was incised using Mayo scissors.   The inferior border of the rectus fascia was dissected of the rectus muscles in a similar fashion.  The midline was identified, the peritoneum was entered bluntly and expanded using manual tractions.  The uterus was noted to be in a none rotated position.  Next the bladder blade was placed retracting the bladder caudally.  A bladder flap was not created.  A low transverse incision was scored on the lower uterine segment.  The hysterotomy was entered bluntly using the operators finger.  The hysterotomy incision was extended using manual traction.  The operators hand was placed within the hysterotomy position noting the fetus to be within the OA position.  The vertex was grasped, flexed, brought to the incision, and delivered a traumatically using fundal pressure.  The remainder of the body delivered with ease.  The infant was suctioned, cord was clamped and cut before handing off to the awaiting neonatologist.  The placenta was delivered using manual extraction.  The uterus was exteriorized, wiped clean of clots and debris using two moist laps.  The hysterotomy was closed using a two layer closure of 0 Vicryl, with the first being a running locked, the second a vertical imbricating.  The uterus was returned to the abdomen.  The peritoneal gutters were wiped clean of clots and debris using two moist laps.  The hysterotomy incision was re-inspected noted to be hemostatic.  The rectus muscles were re-approximated in the midline using a single 2-0 Vicryl mattress stitch.  The rectus muscles were inspected noted to be hemostatic.  The superior border of the  rectus fascia was grasped with a Kocher clamp.  The ON-Q trocars were then placed 4cm above the superior border of the incision and tunneled subfascially.  The introducers were removed and the catheters were threaded through the sleeves after which the sleeves were removed.  The  fascia was closed using a looped #1 PDS in a running fashion taking 1cm by 1cm bites.  The subcutaneous tissue was irrigated using warm saline, hemostasis achieved using the bovie.  The subcutaneous dead space was less than 3cm and was not closed.  The skin was closed using 4-0 Monocryl in a subcuticular fashion.  Sponge needle and instrument counts were corrects times two.  The patient tolerated the procedure well and was taken to the recovery room in stable condition.

## 2018-10-20 NOTE — H&P (Signed)
Date of Initial H&P: 10/17/2018  History reviewed, patient examined, no change in status, stable for surgery.

## 2018-10-20 NOTE — Anesthesia Preprocedure Evaluation (Signed)
Anesthesia Evaluation  Patient identified by MRN, date of birth, ID band Patient awake    Reviewed: Allergy & Precautions, NPO status , Patient's Chart, lab work & pertinent test results, reviewed documented beta blocker date and time   Airway Mallampati: III  TM Distance: >3 FB     Dental  (+) Chipped   Pulmonary Current Smoker,           Cardiovascular hypertension, Pt. on medications and Pt. on home beta blockers      Neuro/Psych PSYCHIATRIC DISORDERS Anxiety Depression    GI/Hepatic GERD  Controlled,  Endo/Other    Renal/GU      Musculoskeletal   Abdominal   Peds  Hematology   Anesthesia Other Findings Obese.  Reproductive/Obstetrics                             Anesthesia Physical Anesthesia Plan  ASA: III  Anesthesia Plan: Spinal   Post-op Pain Management:    Induction:   PONV Risk Score and Plan:   Airway Management Planned:   Additional Equipment:   Intra-op Plan:   Post-operative Plan:   Informed Consent: I have reviewed the patients History and Physical, chart, labs and discussed the procedure including the risks, benefits and alternatives for the proposed anesthesia with the patient or authorized representative who has indicated his/her understanding and acceptance.       Plan Discussed with: CRNA  Anesthesia Plan Comments:         Anesthesia Quick Evaluation

## 2018-10-21 LAB — CBC
HCT: 25.2 % — ABNORMAL LOW (ref 36.0–46.0)
Hemoglobin: 8.4 g/dL — ABNORMAL LOW (ref 12.0–15.0)
MCH: 30.5 pg (ref 26.0–34.0)
MCHC: 33.3 g/dL (ref 30.0–36.0)
MCV: 91.6 fL (ref 80.0–100.0)
Platelets: 291 10*3/uL (ref 150–400)
RBC: 2.75 MIL/uL — ABNORMAL LOW (ref 3.87–5.11)
RDW: 14 % (ref 11.5–15.5)
WBC: 16.4 10*3/uL — ABNORMAL HIGH (ref 4.0–10.5)
nRBC: 0 % (ref 0.0–0.2)

## 2018-10-21 LAB — RPR: RPR Ser Ql: NONREACTIVE

## 2018-10-21 MED ORDER — FERROUS SULFATE 325 (65 FE) MG PO TABS
325.0000 mg | ORAL_TABLET | Freq: Two times a day (BID) | ORAL | Status: DC
  Administered 2018-10-21 – 2018-10-23 (×4): 325 mg via ORAL
  Filled 2018-10-21 (×4): qty 1

## 2018-10-21 MED ORDER — OXYCODONE HCL 5 MG PO TABS
10.0000 mg | ORAL_TABLET | Freq: Four times a day (QID) | ORAL | Status: DC | PRN
  Administered 2018-10-21 – 2018-10-23 (×6): 10 mg via ORAL
  Filled 2018-10-21 (×6): qty 2

## 2018-10-21 MED ORDER — HYDROXYZINE HCL 25 MG PO TABS
25.0000 mg | ORAL_TABLET | Freq: Four times a day (QID) | ORAL | Status: DC | PRN
  Administered 2018-10-21: 25 mg via ORAL
  Filled 2018-10-21: qty 1

## 2018-10-21 MED ORDER — OXYCODONE HCL 5 MG PO TABS: 5.0000 mg | ORAL_TABLET | ORAL | Status: DC | PRN

## 2018-10-21 NOTE — Anesthesia Post-op Follow-up Note (Signed)
  Anesthesia Pain Follow-up Note  Patient: Carol Small  Day #: 1  Date of Follow-up: 10/21/2018 Time: 9:20 AM  Last Vitals:  Vitals:   10/21/18 0303 10/21/18 0809  BP: 120/78 132/85  Pulse: 75 81  Resp:  18  Temp: 36.6 C 36.8 C  SpO2: 100% 100%    Level of Consciousness: alert  Pain: none   Side Effects:None  Catheter Site Exam:clean, dry, no drainage  Anti-Coag Meds (From admission, onward)   Start     Dose/Rate Route Frequency Ordered Stop   10/21/18 0800  enoxaparin (LOVENOX) injection 40 mg     40 mg Subcutaneous Every 24 hours 10/20/18 1438         Plan: D/C from anesthesia care at surgeon's request  Lafayette Dunlevy Lorenza Chick

## 2018-10-21 NOTE — Anesthesia Postprocedure Evaluation (Signed)
Anesthesia Post Note  Patient: Carol Small  Procedure(s) Performed: CESAREAN SECTION (N/A )  Patient location during evaluation: Mother Baby Anesthesia Type: Spinal Level of consciousness: oriented and awake and alert Pain management: pain level controlled Vital Signs Assessment: post-procedure vital signs reviewed and stable Respiratory status: spontaneous breathing and respiratory function stable Cardiovascular status: blood pressure returned to baseline and stable Postop Assessment: no headache, no backache, no apparent nausea or vomiting and able to ambulate Anesthetic complications: no     Last Vitals:  Vitals:   10/21/18 0303 10/21/18 0809  BP: 120/78 132/85  Pulse: 75 81  Resp:  18  Temp: 36.6 C 36.8 C  SpO2: 100% 100%    Last Pain:  Vitals:   10/21/18 0809  TempSrc: Oral  PainSc:                  Hedda Slade

## 2018-10-21 NOTE — Lactation Note (Signed)
This note was copied from a baby's chart. Lactation Consultation Note  Patient Name: Carol Small [id number] Date: 10/21/2018 Reason for consult: Follow-up assessment;Mother's request Mom states she wants to pump and bottlefeed, she has pumped with other children, she states, she has an electric pump for use at home but does not know the name, I encouraged her to pump 8x 24 hrs or every 3 hrs in initiation phase until she obtains 20 cc colostrum from pumping, she obtained approx. 2-3 cc with this pumping session  Maternal Data Formula Feeding for Exclusion: No Does the patient have breastfeeding experience prior to this delivery?: Yes  Feeding Feeding Type: Bottle Fed - Formula  LATCH Score                   Interventions Interventions: DEBP  Lactation Tools Discussed/Used WIC Program: Yes Pump Review: Setup, frequency, and cleaning;Milk Storage Initiated by:: Chaya Jan RNC IBCLC Date initiated:: 10/21/18   Consult Status Consult Status: Follow-up Date: 10/22/18 Follow-up type: In-patient    Ferol Luz 10/21/2018, 6:18 PM

## 2018-10-21 NOTE — Progress Notes (Signed)
POD #1 Repeat Cesarean section Subjective:  Having some pain currently that has not responded to Tylenol and Motrin. Tolerating regular diet and passing flatus. Voided after foley discontinued last night. Bottle feeding   Objective:  Blood pressure 132/85, pulse 81, temperature 98.2 F (36.8 C), temperature source Oral, resp. rate 18, height 5\' 2"  (1.575 m), weight 88.6 kg, last menstrual period 01/26/2018, SpO2 100 %, unknown if currently breastfeeding.  General: BF in NAD Pulmonary: no increased work of breathing/ CTAB Heart: RRR without murmur Abdomen: soft, not tympanic, non-tender, fundus firm at level of umbilicus, BS hypoactive Incision: Honeycomb dressing C&D&I, ON Q intact Extremities: no edema, no erythema, no tenderness  Results for orders placed or performed during the hospital encounter of 10/20/18 (from the past 72 hour(s))  CBC     Status: Abnormal   Collection Time: 10/20/18  6:28 AM  Result Value Ref Range   WBC 14.0 (H) 4.0 - 10.5 K/uL   RBC 3.23 (L) 3.87 - 5.11 MIL/uL   Hemoglobin 9.7 (L) 12.0 - 15.0 g/dL   HCT 29.6 (L) 36.0 - 46.0 %   MCV 91.6 80.0 - 100.0 fL   MCH 30.0 26.0 - 34.0 pg   MCHC 32.8 30.0 - 36.0 g/dL   RDW 14.0 11.5 - 15.5 %   Platelets 343 150 - 400 K/uL   nRBC 0.0 0.0 - 0.2 %    Comment: Performed at [facility], [address] [place], [place] [address]  Comprehensive metabolic panel     Status: Abnormal   Collection Time: 10/20/18  6:28 AM  Result Value Ref Range   Sodium 138 135 - 145 mmol/L   Potassium 3.2 (L) 3.5 - 5.1 mmol/L   Chloride 106 98 - 111 mmol/L   CO2 21 (L) 22 - 32 mmol/L   Glucose, Bld 84 70 - 99 mg/dL   BUN 8 6 - 20 mg/dL   Creatinine, Ser 0.73 0.44 - 1.00 mg/dL   Calcium 8.7 (L) 8.9 - 10.3 mg/dL   Total Protein 6.2 (L) 6.5 - 8.1 g/dL   Albumin 2.7 (L) 3.5 - 5.0 g/dL   AST 25 15 - 41 U/L   ALT 18 0 - 44 U/L   Alkaline Phosphatase 151 (H) 38 - 126 U/L   Total Bilirubin 0.5 0.3 - 1.2 mg/dL   GFR calc non  Af Amer >60 >60 mL/min   GFR calc Af Amer >60 >60 mL/min   Anion gap 11 5 - 15    Comment: Performed at [facility], [address] [place], [place] [address]  RPR     Status: None   Collection Time: 10/20/18  6:28 AM  Result Value Ref Range   RPR Ser Ql Non Reactive Non Reactive    Comment: (NOTE) Performed At: [facility] [place], [place] [id number] Rush Farmer MD TA:[id number]   Type and screen     Status: None   Collection Time: 10/20/18  8:41 AM  Result Value Ref Range   ABO/RH(D) AB POS    Antibody Screen NEG    Sample Expiration      10/23/2018,2359 Performed at [facility], [address] [place],  [address]   CBC     Status: Abnormal   Collection Time: 10/21/18  4:34 AM  Result Value Ref Range   WBC 16.4 (H) 4.0 - 10.5 K/uL   RBC 2.75 (L) 3.87 - 5.11 MIL/uL   Hemoglobin 8.4 (L) 12.0 - 15.0  g/dL   HCT 25.2 (L) 36.0 - 46.0 %   MCV 91.6 80.0 - 100.0 fL   MCH 30.5 26.0 - 34.0 pg   MCHC 33.3 30.0 - 36.0 g/dL   RDW 14.0 11.5 - 15.5 %   Platelets 291 150 - 400 K/uL   nRBC 0.0 0.0 - 0.2 %    Comment: Performed at [facility], [address], [place], [place] [address]     Assessment:   33 y.o. [phone] postoperativeday # 1  Stable  Continue postpartum care CHTN-normotensive on labetalol  Plan:  1) Acute blood loss anemia - hemodynamically stable and asymptomatic - po ferrous sulfate/ vitamins  2) AB POS/ RI/ VI  3) TDAP -given AP  4) Bottle/Contraception-undecided  5) Disposition-possible discharge home tomorrow    Dalia Heading, [place]

## 2018-10-22 MED ORDER — OXYCODONE-ACETAMINOPHEN 5-325 MG PO TABS: 1.0000 | ORAL_TABLET | Freq: Four times a day (QID) | ORAL | 0 refills | Status: DC | PRN

## 2018-10-22 MED ORDER — MEDROXYPROGESTERONE ACETATE 150 MG/ML IM SUSP
150.0000 mg | Freq: Once | INTRAMUSCULAR | Status: AC
  Administered 2018-10-23: 12:00:00 150 mg via INTRAMUSCULAR
  Filled 2018-10-22: qty 1

## 2018-10-22 MED ORDER — LABETALOL HCL 200 MG PO TABS
200.0000 mg | ORAL_TABLET | Freq: Two times a day (BID) | ORAL | Status: DC
  Administered 2018-10-22 (×2): 200 mg via ORAL
  Filled 2018-10-22 (×2): qty 1

## 2018-10-22 MED ORDER — LABETALOL HCL 200 MG PO TABS: 200.0000 mg | ORAL_TABLET | Freq: Two times a day (BID) | ORAL | 11 refills | Status: DC

## 2018-10-22 NOTE — Progress Notes (Signed)
Admit Date: 10/20/2018 Today's Date: 10/22/2018  Subjective: Postpartum Day 2: Cesarean Delivery Patient reports tolerating PO, + flatus and no problems voiding.    Objective: Vital signs in last 24 hours: Temp:  [98.1 F (36.7 C)-98.5 F (36.9 C)] 98.5 F (36.9 C) (08/15 0747) Pulse Rate:  [79-88] 79 (08/15 0839) Resp:  [18-20] 20 (08/15 0747) BP: (137-160)/(85-101) 160/97 (08/15 0839) SpO2:  [100 %] 100 % (08/15 0747)  Physical Exam:  General: alert, cooperative and no distress Lochia: appropriate Uterine Fundus: firm Incision: healing well, no significant drainage, no dehiscence, no significant erythema DVT Evaluation: No evidence of DVT seen on physical exam. Negative Homan's sign.  Recent Labs    10/20/18 0628 10/21/18 0434  HGB 9.7* 8.4*  HCT 29.6* 25.2*    Assessment/Plan: Status post Cesarean section. Doing well postoperatively.  Continue current care.  Hypertension worse during night.  Pain may contribute. Labetalol increased to 200 mg BID for BP control (she has been on long term antihypertensives,, prior to pregnancy).  Will cont this medicine until transition can be made later. Lexapro daily Depo for [facility]   Hoyt Koch 10/22/2018, 9:30 AM

## 2018-10-22 NOTE — Discharge Instructions (Signed)
Cesarean Delivery, Care After This sheet gives you information about how to care for yourself after your procedure. Your health care provider may also give you more specific instructions. If you have problems or questions, contact your health care provider. What can I expect after the procedure? After the procedure, it is common to have:  A small amount of blood or clear fluid coming from the incision.  Some redness, swelling, and pain in your incision area.  Some abdominal pain and soreness.  Vaginal bleeding (lochia). Even though you did not have a vaginal delivery, you will still have vaginal bleeding and discharge.  Pelvic cramps.  Fatigue. You may have pain, swelling, and discomfort in the tissue between your vagina and your anus (perineum) if:  Your C-section was unplanned, and you were allowed to labor and push.  An incision was made in the area (episiotomy) or the tissue tore during attempted vaginal delivery. Follow these instructions at home: Incision care   Follow instructions from your health care provider about how to take care of your incision. Make sure you: ? Wash your hands with soap and water before you change your bandage (dressing). If soap and water are not available, use hand sanitizer. ? If you have a dressing, change it or remove it as told by your health care provider. ? Leave stitches (sutures), skin staples, skin glue, or adhesive strips in place. These skin closures may need to stay in place for 2 weeks or longer. If adhesive strip edges start to loosen and curl up, you may trim the loose edges. Do not remove adhesive strips completely unless your health care provider tells you to do that.  Check your incision area every day for signs of infection. Check for: ? More redness, swelling, or pain. ? More fluid or blood. ? Warmth. ? Pus or a bad smell.  Do not take baths, swim, or use a hot tub until your health care provider says it's okay. Ask your health  care provider if you can take showers.  When you cough or sneeze, hug a pillow. This helps with pain and decreases the chance of your incision opening up (dehiscing). Do this until your incision heals. Medicines  Take over-the-counter and prescription medicines only as told by your health care provider.  If you were prescribed an antibiotic medicine, take it as told by your health care provider. Do not stop taking the antibiotic even if you start to feel better.  Do not drive or use heavy machinery while taking prescription pain medicine. Lifestyle  Do not drink alcohol. This is especially important if you are breastfeeding or taking pain medicine.  Do not use any products that contain nicotine or tobacco, such as cigarettes, e-cigarettes, and chewing tobacco. If you need help quitting, ask your health care provider. Eating and drinking  Drink at least 8 eight-ounce glasses of water every day unless told not to by your health care provider. If you breastfeed, you may need to drink even more water.  Eat high-fiber foods every day. These foods may help prevent or relieve constipation. High-fiber foods include: ? Whole grain cereals and breads. ? Brown rice. ? Beans. ? Fresh fruits and vegetables. Activity   If possible, have someone help you care for your baby and help with household activities for at least a few days after you leave the hospital.  Return to your normal activities as told by your health care provider. Ask your health care provider what activities are safe for  you.  Rest as much as possible. Try to rest or take a nap while your baby is sleeping.  Do not lift anything that is heavier than 10 lbs (4.5 kg), or the limit that you were told, until your health care provider says that it is safe.  Talk with your health care provider about when you can engage in sexual activity. This may depend on your: ? Risk of infection. ? How fast you heal. ? Comfort and desire to  engage in sexual activity. General instructions  Do not use tampons or douches until your health care provider approves.  Wear loose, comfortable clothing and a supportive and well-fitting bra.  Keep your perineum clean and dry. Wipe from front to back when you use the toilet.  If you pass a blood clot, save it and call your health care provider to discuss. Do not flush blood clots down the toilet before you get instructions from your health care provider.  Keep all follow-up visits for you and your baby as told by your health care provider. This is important. Contact a health care provider if:  You have: ? A fever. ? Bad-smelling vaginal discharge. ? Pus or a bad smell coming from your incision. ? Difficulty or pain when urinating. ? A sudden increase or decrease in the frequency of your bowel movements. ? More redness, swelling, or pain around your incision. ? More fluid or blood coming from your incision. ? A rash. ? Nausea. ? Little or no interest in activities you used to enjoy. ? Questions about caring for yourself or your baby.  Your incision feels warm to the touch.  Your breasts turn red or become painful or hard.  You feel unusually sad or worried.  You vomit.  You pass a blood clot from your vagina.  You urinate more than usual.  You are dizzy or light-headed. Get help right away if:  You have: ? Pain that does not go away or get better with medicine. ? Chest pain. ? Difficulty breathing. ? Blurred vision or spots in your vision. ? Thoughts about hurting yourself or your baby. ? New pain in your abdomen or in one of your legs. ? A severe headache.  You faint.  You bleed from your vagina so much that you fill more than one sanitary pad in one hour. Bleeding should not be heavier than your heaviest period. Summary  After the procedure, it is common to have pain at your incision site, abdominal cramping, and slight bleeding from your vagina.  Check  your incision area every day for signs of infection.  Tell your health care provider about any unusual symptoms.  Keep all follow-up visits for you and your baby as told by your health care provider. This information is not intended to replace advice given to you by your health care provider. Make sure you discuss any questions you have with your health care provider. Document Released: 11/15/2001 Document Revised: 09/01/2017 Document Reviewed: 09/01/2017 Elsevier Patient Education  2020 [employer].

## 2018-10-22 NOTE — Addendum Note (Signed)
Addendum  created 10/22/18 1656 by Alphonsus Sias, MD   Clinical Note Signed

## 2018-10-22 NOTE — Anesthesia Postprocedure Evaluation (Signed)
Anesthesia Post Note  Patient: Carol Small  Procedure(s) Performed: CESAREAN SECTION (N/A )  Patient location during evaluation: Mother Baby Anesthesia Type: Spinal Comments: CTSP with CC tenderness at spinal insertion site. Exam revealed tender area, but well healed small puncture site at L3/4. No redness, swelling or drainage noted. Afeb/VSS. No HA, normal neuro. Assessment: mild inflammatory tenderness of needle insertion site. Recommend ice packs, oral analgesics. To re-contact if not improving.     Last Vitals:  Vitals:   10/22/18 1144 10/22/18 1634  BP: 135/87 (!) 155/100  Pulse: 79 77  Resp: 20 18  Temp: 37.1 C 36.8 C  SpO2:      Last Pain:  Vitals:   10/22/18 1634  TempSrc: Oral  PainSc:                  Alphonsus Sias

## 2018-10-23 MED ORDER — LABETALOL HCL 300 MG PO TABS: 300.0000 mg | ORAL_TABLET | Freq: Two times a day (BID) | ORAL | 3 refills | Status: DC

## 2018-10-23 MED ORDER — LABETALOL HCL 200 MG PO TABS
300.0000 mg | ORAL_TABLET | Freq: Two times a day (BID) | ORAL | Status: DC
  Administered 2018-10-23: 300 mg via ORAL
  Filled 2018-10-23: qty 1

## 2018-10-23 NOTE — Progress Notes (Signed)
Admit Date: 10/20/2018 Today's Date: 10/23/2018  Subjective: Postpartum Day 3: Cesarean Delivery Patient reports tolerating PO, + flatus and no problems voiding.   Denies ha, CP, SOB, edema BP have been up and down, no change in sx's based on BP readings  Objective: Vital signs in last 24 hours: Temp:  [98.1 F (36.7 C)-98.8 F (37.1 C)] 98.1 F (36.7 C) (08/16 0754) Pulse Rate:  [77-95] 85 (08/16 0754) Resp:  [18-20] 18 (08/16 0754) BP: (118-162)/(78-103) 162/103 (08/16 0754) SpO2:  [100 %] 100 % (08/16 0754)  Physical Exam:  General: alert, cooperative and no distress Lochia: appropriate Uterine Fundus: firm Incision: healing well DVT Evaluation: No evidence of DVT seen on physical exam. Negative Homan's sign.  Recent Labs    10/21/18 0434  HGB 8.4*  HCT 25.2*    Assessment/Plan: Status post Cesarean section. Doing well postoperatively.  HTN, not fully controlled w Labetalol, no s/sx preeclampsia or other concerning sx's. Planning to cont Labetalol as outpatient Lexapro Depo for [facility]  Hoyt Koch 10/23/2018, 8:27 AM

## 2018-10-23 NOTE — Progress Notes (Signed)
Per Dr. Kenton Kingfisher, okay to discharge patient. Pt discharged with infant. Discharge instructions, prescriptions, and follow up appointments given to and reviewed with patient. Pt verbalized understanding. Escorted out by staff.

## 2018-10-24 ENCOUNTER — Encounter: Payer: Self-pay | Admitting: Obstetrics and Gynecology

## 2018-10-24 ENCOUNTER — Other Ambulatory Visit: Payer: Self-pay

## 2018-10-24 ENCOUNTER — Ambulatory Visit (INDEPENDENT_AMBULATORY_CARE_PROVIDER_SITE_OTHER): Payer: Medicaid Other | Admitting: Obstetrics and Gynecology

## 2018-10-24 VITALS — BP 152/90 | HR 91 | Ht 62.0 in | Wt 179.0 lb

## 2018-10-24 DIAGNOSIS — Z4889 Encounter for other specified surgical aftercare: Secondary | ICD-10-CM

## 2018-10-24 NOTE — Progress Notes (Signed)
Postoperative Follow-up Patient presents post op from RLTCS 1weeks ago for history of prior C-section.  Subjective: Patient reports some improvement in her preop symptoms. Eating a regular diet without difficulty. Pain is controlled with current analgesics. Medications being used: percocet and ibuprofen.  Activity: normal activities of daily living.  Objective: Blood pressure (!) 152/90, pulse 91, height 5\' 2"  (1.575 m), weight 179 lb (81.2 kg), currently breastfeeding.  General: NAD Pulmonary: no increased work of breathing Abdomen: soft, non-tender, non-distended, incision D/C/I.  Small amount of induration around umbilicus just superior to ON-Q catheter site likely small hematoma Extremities: no edema Neurologic: normal gait    Admission on 10/20/2018, Discharged on 10/23/2018  Component Date Value Ref Range Status  . WBC 10/20/2018 14.0* 4.0 - 10.5 K/uL Final  . RBC 10/20/2018 3.23* 3.87 - 5.11 MIL/uL Final  . Hemoglobin 10/20/2018 9.7* 12.0 - 15.0 g/dL Final  . HCT 10/20/2018 29.6* 36.0 - 46.0 % Final  . MCV 10/20/2018 91.6  80.0 - 100.0 fL Final  . MCH 10/20/2018 30.0  26.0 - 34.0 pg Final  . MCHC 10/20/2018 32.8  30.0 - 36.0 g/dL Final  . RDW 10/20/2018 14.0  11.5 - 15.5 % Final  . Platelets 10/20/2018 343  150 - 400 K/uL Final  . nRBC 10/20/2018 0.0  0.0 - 0.2 % Final   Performed at [facility], [address] [place], [place] [address]  . Sodium 10/20/2018 138  135 - 145 mmol/L Final  . Potassium 10/20/2018 3.2* 3.5 - 5.1 mmol/L Final  . Chloride 10/20/2018 106  98 - 111 mmol/L Final  . CO2 10/20/2018 21* 22 - 32 mmol/L Final  . Glucose, Bld 10/20/2018 84  70 - 99 mg/dL Final  . BUN 10/20/2018 8  6 - 20 mg/dL Final  . Creatinine, Ser 10/20/2018 0.73  0.44 - 1.00 mg/dL Final  . Calcium 10/20/2018 8.7* 8.9 - 10.3 mg/dL Final  . Total Protein 10/20/2018 6.2* 6.5 - 8.1 g/dL Final  . Albumin 10/20/2018 2.7* 3.5 - 5.0 g/dL Final  . AST 10/20/2018 25   15 - 41 U/L Final  . ALT 10/20/2018 18  0 - 44 U/L Final  . Alkaline Phosphatase 10/20/2018 151* 38 - 126 U/L Final  . Total Bilirubin 10/20/2018 0.5  0.3 - 1.2 mg/dL Final  . GFR calc non Af Amer 10/20/2018 >60  >60 mL/min Final  . GFR calc Af Amer 10/20/2018 >60  >60 mL/min Final  . Anion gap 10/20/2018 11  5 - 15 Final   Performed at [facility], [address], [place], [place] [address]  . RPR Ser Ql 10/20/2018 Non Reactive  Non Reactive Final   Comment: (NOTE) Performed At: [facility] [address] [place], [place] [id number] Rush Farmer MD [id number]   . ABO/RH(D) 10/20/2018 AB POS   Final  . Antibody Screen 10/20/2018 NEG   Final  . Sample Expiration 10/20/2018    Final                   Value:10/23/2018,2359 Performed at [facility], [address] [place], [place] [address]   . WBC 10/21/2018 16.4* 4.0 - 10.5 K/uL Final  . RBC 10/21/2018 2.75* 3.87 - 5.11 MIL/uL Final  . Hemoglobin 10/21/2018 8.4* 12.0 - 15.0 g/dL Final  . HCT 10/21/2018 25.2* 36.0 - 46.0 % Final  . MCV 10/21/2018 91.6  80.0 - 100.0 fL Final  . MCH 10/21/2018 30.5  26.0 -  34.0 pg Final  . MCHC 10/21/2018 33.3  30.0 - 36.0 g/dL Final  . RDW 10/21/2018 14.0  11.5 - 15.5 % Final  . Platelets 10/21/2018 291  150 - 400 K/uL Final  . nRBC 10/21/2018 0.0  0.0 - 0.2 % Final   Performed at [facility], [address] [place], [place] [address]    Assessment: 33 y.o. s/p RLTCS stable  Plan: Patient has done well after surgery with no apparent complications.  I have discussed the post-operative course to date, and the expected progress moving forward.  The patient understands what complications to be concerned about.  I will see the patient in routine follow up, or sooner if needed.    Activity plan: No heavy lifting  Contraception - received depo provera prior to discharge  CHTN - asymptomatic, mild range BP, continue labetalol   Malachy Mood, MD,  [facility] 10/24/2018, 10:19 AM

## 2018-11-07 ENCOUNTER — Other Ambulatory Visit: Payer: Self-pay | Admitting: Obstetrics & Gynecology

## 2018-11-07 NOTE — Telephone Encounter (Signed)
C/S 8/13. Please advise

## 2018-11-08 ENCOUNTER — Other Ambulatory Visit: Payer: Self-pay

## 2018-11-08 ENCOUNTER — Ambulatory Visit (INDEPENDENT_AMBULATORY_CARE_PROVIDER_SITE_OTHER): Payer: Medicaid Other | Admitting: Obstetrics and Gynecology

## 2018-11-08 ENCOUNTER — Encounter: Payer: Self-pay | Admitting: Obstetrics and Gynecology

## 2018-11-08 VITALS — BP 130/88 | HR 84 | Temp 98.3°F | Wt 149.0 lb

## 2018-11-08 DIAGNOSIS — Z4889 Encounter for other specified surgical aftercare: Secondary | ICD-10-CM

## 2018-11-08 MED ORDER — HYDROCODONE-ACETAMINOPHEN 5-325 MG PO TABS: 1.0000 | ORAL_TABLET | Freq: Four times a day (QID) | ORAL | 0 refills | Status: DC | PRN

## 2018-11-08 NOTE — Progress Notes (Signed)
Postoperative Follow-up Patient presents post op from RLTCS 3weeks ago for history of prior C-section, CHTN, and polyhydramnios.  Subjective: Patient reports some improvement in her preop symptoms. Eating a regular diet without difficulty. Pain is controlled without any medications.  Activity: normal activities of daily living.  Still complaining of knot supraumbilical and pain.  No changes in bowl movements.  Objective: Blood pressure 130/88, pulse 84, temperature 98.3 F (36.8 C), weight 149 lb (67.6 kg), currently breastfeeding  General: NAD Pulmonary: no increased work of breathing Abdomen: soft, non-tender, non-distended, incision D/C/I.  I do not appreciate any mass supraumbilical.  There is no notable hernia in the patient area of concern Extremities: no edema Neurologic: normal gait    Admission on 10/20/2018, Discharged on 10/23/2018  Component Date Value Ref Range Status  . WBC 10/20/2018 14.0* 4.0 - 10.5 K/uL Final  . RBC 10/20/2018 3.23* 3.87 - 5.11 MIL/uL Final  . Hemoglobin 10/20/2018 9.7* 12.0 - 15.0 g/dL Final  . HCT 10/20/2018 29.6* 36.0 - 46.0 % Final  . MCV 10/20/2018 91.6  80.0 - 100.0 fL Final  . MCH 10/20/2018 30.0  26.0 - 34.0 pg Final  . MCHC 10/20/2018 32.8  30.0 - 36.0 g/dL Final  . RDW 10/20/2018 14.0  11.5 - 15.5 % Final  . Platelets 10/20/2018 343  150 - 400 K/uL Final  . nRBC 10/20/2018 0.0  0.0 - 0.2 % Final   Performed at [facility], [address] [place], [place] [address]  . Sodium 10/20/2018 138  135 - 145 mmol/L Final  . Potassium 10/20/2018 3.2* 3.5 - 5.1 mmol/L Final  . Chloride 10/20/2018 106  98 - 111 mmol/L Final  . CO2 10/20/2018 21* 22 - 32 mmol/L Final  . Glucose, Bld 10/20/2018 84  70 - 99 mg/dL Final  . BUN 10/20/2018 8  6 - 20 mg/dL Final  . Creatinine, Ser 10/20/2018 0.73  0.44 - 1.00 mg/dL Final  . Calcium 10/20/2018 8.7* 8.9 - 10.3 mg/dL Final  . Total Protein 10/20/2018 6.2* 6.5 - 8.1 g/dL Final  .  Albumin 10/20/2018 2.7* 3.5 - 5.0 g/dL Final  . AST 10/20/2018 25  15 - 41 U/L Final  . ALT 10/20/2018 18  0 - 44 U/L Final  . Alkaline Phosphatase 10/20/2018 151* 38 - 126 U/L Final  . Total Bilirubin 10/20/2018 0.5  0.3 - 1.2 mg/dL Final  . GFR calc non Af Amer 10/20/2018 >60  >60 mL/min Final  . GFR calc Af Amer 10/20/2018 >60  >60 mL/min Final  . Anion gap 10/20/2018 11  5 - 15 Final   Performed at [facility], [address] [place], [place] [address]  . RPR Ser Ql 10/20/2018 Non Reactive  Non Reactive Final   Comment: (NOTE) Performed At: [facility] [address] [place], [place] [id number] Rush Farmer MD [id number]   . ABO/RH(D) 10/20/2018 AB POS   Final  . Antibody Screen 10/20/2018 NEG   Final  . Sample Expiration 10/20/2018    Final                   Value:10/23/2018,2359 Performed at [facility], [address], [place], [place] [address]   . WBC 10/21/2018 16.4* 4.0 - 10.5 K/uL Final  . RBC 10/21/2018 2.75* 3.87 - 5.11 MIL/uL Final  . Hemoglobin 10/21/2018 8.4* 12.0 - 15.0 g/dL Final  . HCT 10/21/2018 25.2* 36.0 - 46.0 % Final  . MCV 10/21/2018  91.6  80.0 - 100.0 fL Final  . MCH 10/21/2018 30.5  26.0 - 34.0 pg Final  . MCHC 10/21/2018 33.3  30.0 - 36.0 g/dL Final  . RDW 10/21/2018 14.0  11.5 - 15.5 % Final  . Platelets 10/21/2018 291  150 - 400 K/uL Final  . nRBC 10/21/2018 0.0  0.0 - 0.2 % Final   Performed at [facility], [address] [place],  [address]    Assessment: 33 y.o. s/p RLTCS stable  Plan: Patient has done well after surgery with no apparent complications.  I have discussed the post-operative course to date, and the expected progress moving forward.  The patient understands what complications to be concerned about.  I will see the patient in routine follow up, or sooner if needed.    Activity plan: No heavy lifting.  On depo provera received post discharge  Still complaining of  supraumbilical discomfort no hernia apprciated generall surgery consult if persists at time of 6 week postpartum visit.  Rx vicoding # 12 tabs   Malachy Mood, MD, [place], [facility] 11/08/2018, 10:36 AM

## 2018-11-28 ENCOUNTER — Ambulatory Visit: Payer: Medicaid Other | Admitting: Obstetrics and Gynecology

## 2018-12-09 ENCOUNTER — Ambulatory Visit: Payer: Medicaid Other | Admitting: Obstetrics and Gynecology

## 2018-12-16 ENCOUNTER — Other Ambulatory Visit: Payer: Self-pay

## 2018-12-16 ENCOUNTER — Ambulatory Visit (INDEPENDENT_AMBULATORY_CARE_PROVIDER_SITE_OTHER): Payer: Medicaid Other | Admitting: Obstetrics and Gynecology

## 2018-12-16 ENCOUNTER — Encounter: Payer: Self-pay | Admitting: Obstetrics and Gynecology

## 2018-12-16 DIAGNOSIS — Z1389 Encounter for screening for other disorder: Secondary | ICD-10-CM

## 2018-12-16 DIAGNOSIS — Z3042 Encounter for surveillance of injectable contraceptive: Secondary | ICD-10-CM

## 2018-12-16 MED ORDER — MEDROXYPROGESTERONE ACETATE 150 MG/ML IM SUSP: 150.0000 mg | INTRAMUSCULAR | 3 refills | Status: DC

## 2018-12-16 MED ORDER — ESCITALOPRAM OXALATE 10 MG PO TABS: 10.0000 mg | ORAL_TABLET | Freq: Every day | ORAL | 11 refills | Status: DC

## 2018-12-16 MED ORDER — LABETALOL HCL 300 MG PO TABS: 300.0000 mg | ORAL_TABLET | Freq: Two times a day (BID) | ORAL | 11 refills | Status: DC

## 2018-12-16 NOTE — Progress Notes (Signed)
Postpartum Visit  Chief Complaint:  Chief Complaint  Patient presents with  . Postpartum Care    C/S 8/13    History of Present Illness: Patient is a 33 y.o. [id number] presents for postpartum visit.  Date of delivery:  Cesarean Section: Elective repeat Pregnancy or labor problems:  Yes CHTN Any problems since the delivery:  no  Newborn Details:  SINGLETON :  41. BabyGender female. Birth weight: 7lbs 13oz Maternal Details:  Breast or formula feeding: plans to bottle feed Intercourse: No  Contraception after delivery: Yes  Any bowel or bladder issues: No  Post partum depression/anxiety noted:  no Edinburgh Post-Partum Depression Score:5 Date of last PAP: 03/23/2018  NIL and HR HPV negative   Review of Systems: Review of Systems  Constitutional: Negative.   Gastrointestinal: Negative.   Genitourinary: Negative.   Skin: Negative.   Psychiatric/Behavioral: Negative.     The following portions of the patient's history were reviewed and updated as appropriate: allergies, current medications, past family history, past medical history, past social history, past surgical history and problem list.  Past Medical History:  Past Medical History:  Diagnosis Date  . Anxiety   . GERD (gastroesophageal reflux disease)   . Hypertension    NO MEDS-NEVER FOLLOWED BACK UP WITH PCP  . S/P LEEP of cervix     Past Surgical History:  Past Surgical History:  Procedure Laterality Date  . BREAST LUMPECTOMY Left 07/01/2015   Procedure: BREAST LUMPECTOMY;  Surgeon: Christene Lye, MD;  Location: [facility] ORS;  Service: General;  Laterality: Left;  . CESAREAN SECTION  05/11/2011   fetal intolerance  . CESAREAN SECTION N/A 10/20/2018   Procedure: CESAREAN SECTION;  Surgeon: Malachy Mood, MD;  Location: [facility] ORS;  Service: Obstetrics;  Laterality: N/A;  . FOOT SURGERY      Family History:  Family History  Problem Relation Age of Onset  . Thyroid cancer Maternal Grandmother      Social History:  Social History   Socioeconomic History  . Marital status: Single    Spouse name: Not on file  . Number of children: Not on file  . Years of education: Not on file  . Highest education level: Not on file  Occupational History  . Not on file  Social Needs  . Financial resource strain: Not hard at all  . Food insecurity    Worry: Never true    Inability: Never true  . Transportation needs    Medical: No    Non-medical: No  Tobacco Use  . Smoking status: Current Every Day Smoker    Packs/day: 0.25    Years: 13.00    Pack years: 3.25    Types: Cigarettes  . Smokeless tobacco: Never Used  Substance and Sexual Activity  . Alcohol use: Not Currently    Alcohol/week: 0.0 standard drinks    Comment: occ  . Drug use: No  . Sexual activity: Yes  Lifestyle  . Physical activity    Days per week: 0 days    Minutes per session: 0 min  . Stress: Very much  Relationships  . Social connections    Talks on phone: More than three times a week    Gets together: More than three times a week    Attends religious service: Never    Active member of club or organization: No    Attends meetings of clubs or organizations: Never    Relationship status: Patient refused  . Intimate partner violence  Fear of current or ex partner: No    Emotionally abused: No    Physically abused: No    Forced sexual activity: No  Other Topics Concern  . Not on file  Social History Narrative  . Not on file    Allergies:  Allergies  Allergen Reactions  . Erythromycin Base Other (See Comments)    Stomach cramps  . Adhesive [Tape] Other (See Comments)    Skin becomes raw  . Nickel Rash    Medications: Prior to Admission medications   Medication Sig Start Date End Date Taking? Authorizing Provider  acetaminophen (TYLENOL) 500 MG tablet Take 500-1,000 mg by mouth every 6 (six) hours as needed (for pain.).    [provider]  calcium carbonate (TUMS - DOSED IN MG ELEMENTAL  CALCIUM) 500 MG chewable tablet Chew 1 tablet by mouth 4 (four) times daily as needed for indigestion or heartburn.     [provider]  escitalopram (LEXAPRO) 10 MG tablet Take 10 mg by mouth daily.    [provider]  famotidine (PEPCID) 20 MG tablet Take 1 tablet (20 mg total) by mouth 2 (two) times daily. Patient taking differently: Take 20 mg by mouth daily.  07/07/18   Rexene Agent, CNM  HYDROcodone-acetaminophen (NORCO/VICODIN) 5-325 MG tablet Take 1 tablet by mouth every 6 (six) hours as needed. 11/08/18   Malachy Mood, MD  labetalol (NORMODYNE) 300 MG tablet Take 1 tablet (300 mg total) by mouth 2 (two) times daily. 10/23/18   Gae Dry, MD  Menthol, Topical Analgesic, (BIOFREEZE EX) Apply 1 application topically 4 (four) times daily as needed (pain.).    [provider]  oxyCODONE-acetaminophen (PERCOCET/ROXICET) 5-325 MG tablet Take 1-2 tablets by mouth every 6 (six) hours as needed. 10/22/18   Gae Dry, MD  Prenat-FeFmCb-DSS-FA-DHA w/o A (CITRANATAL HARMONY) 27-1-260 MG CAPS Take 1 tablet by mouth daily. 03/30/18   Gae Dry, MD  sucralfate (CARAFATE) 1 g tablet Take 1 tablet (1 g total) by mouth 4 (four) times daily. 10/04/18 10/04/19  Homero Fellers, MD  traZODone (DESYREL) 50 MG tablet Take 1 tablet (50 mg total) by mouth at bedtime as needed for sleep. 09/04/18   Malachy Mood, MD    Physical Exam Blood pressure 138/88, pulse (!) 102, height 5\' 2"  (1.575 m), weight 158 lb (71.7 kg), not currently breastfeeding.  General: NAD HEENT: normocephalic, anicteric Pulmonary: No increased work of breathing Abdomen: NABS, soft, non-tender, non-distended.  Umbilicus without lesions.  No hepatomegaly, splenomegaly or masses palpable. No evidence of hernia. Incision well healed Genitourinary:  External: Normal external female genitalia.  Normal urethral meatus, normal  Bartholin's and Skene's glands.    Vagina: Normal vaginal  mucosa, no evidence of prolapse.    Cervix: Grossly normal in appearance, no bleeding  Uterus: Non-enlarged, mobile, normal contour.  No CMT  Adnexa: ovaries non-enlarged, no adnexal masses  Rectal: deferred Extremities: no edema, erythema, or tenderness Neurologic: Grossly intact Psychiatric: mood appropriate, affect full  Edinburgh Postnatal Depression Scale - 12/16/18 1049      Edinburgh Postnatal Depression Scale:  In the Past 7 Days   I have been able to laugh and see the funny side of things.  1    I have looked forward with enjoyment to things.  0    I have blamed myself unnecessarily when things went wrong.  0    I have been anxious or worried for no good reason.  1  I have felt scared or panicky for no good reason.  0    Things have been getting on top of me.  1    I have been so unhappy that I have had difficulty sleeping.  0    I have felt sad or miserable.  2    I have been so unhappy that I have been crying.  0    The thought of harming myself has occurred to me.  0    Edinburgh Postnatal Depression Scale Total  5       Assessment: 33 y.o. [id number] presenting for 6 week postpartum visit  Plan: Problem List Items Addressed This Visit    None    Visit Diagnoses    6 weeks postpartum follow-up    -  Primary   Encounter for surveillance of injectable contraceptive          1) Contraception - Education given regarding options for contraception, as well as compatibility with breast feeding if applicable.  Patient plans on Depo-Provera injections for contraception.  2)  Pap - ASCCP guidelines and rational discussed.  ASCCP guidelines and rational discussed.  Patient opts for every 3 years screening interval  3) Patient underwent screening for postpartum depression with no signs of depression  - continue lexapro 10mg  po daily  4) CHTN - well controlled on current dose of labetalol 300mg  po bid, continue  4)  Return in about 6 weeks (around 01/27/2019) for Depo  injection every 3 months (first in 6 weeks), 1 year annual.   Malachy Mood, MD, [place], [facility] 12/16/2018, 11:40 AM

## 2019-01-25 ENCOUNTER — Telehealth: Payer: Self-pay

## 2019-01-25 ENCOUNTER — Other Ambulatory Visit: Payer: Self-pay

## 2019-01-25 ENCOUNTER — Ambulatory Visit: Payer: Medicaid Other

## 2019-01-25 NOTE — Telephone Encounter (Signed)
Patient inquiring if [facility] will give her a referral for Covid testing. [id number]

## 2019-01-25 NOTE — Telephone Encounter (Signed)
Spoke w/patient. Advised she does not need referral for testing. Directions given for Community test site at visitors entrance at [facility]. Pt states front desk had advised her she could also get testing at [facility]

## 2019-01-27 ENCOUNTER — Ambulatory Visit: Payer: Medicaid Other

## 2019-01-27 ENCOUNTER — Other Ambulatory Visit: Payer: Self-pay

## 2019-01-27 DIAGNOSIS — O26893 Other specified pregnancy related conditions, third trimester: Secondary | ICD-10-CM

## 2019-01-27 DIAGNOSIS — N912 Amenorrhea, unspecified: Secondary | ICD-10-CM

## 2019-01-27 DIAGNOSIS — Z3202 Encounter for pregnancy test, result negative: Secondary | ICD-10-CM | POA: Diagnosis not present

## 2019-01-27 DIAGNOSIS — Z3042 Encounter for surveillance of injectable contraceptive: Secondary | ICD-10-CM

## 2019-01-27 LAB — POCT URINE PREGNANCY: Preg Test, Ur: NEGATIVE

## 2019-01-27 MED ORDER — MEDROXYPROGESTERONE ACETATE 150 MG/ML IM SUSP
150.0000 mg | Freq: Once | INTRAMUSCULAR | Status: AC
  Administered 2019-01-27: 14:00:00 150 mg via INTRAMUSCULAR

## 2019-01-27 NOTE — Progress Notes (Signed)
Patient presents today for Depo Provera injection outside of date range. Patient not currently on menses. Pregnancy test performed per protocol (negative).Given IM RUOQ. Patient tolerated well. 

## 2019-02-02 DIAGNOSIS — Y999 Unspecified external cause status: Secondary | ICD-10-CM | POA: Diagnosis not present

## 2019-02-02 DIAGNOSIS — I10 Essential (primary) hypertension: Secondary | ICD-10-CM | POA: Insufficient documentation

## 2019-02-02 DIAGNOSIS — F0781 Postconcussional syndrome: Secondary | ICD-10-CM | POA: Insufficient documentation

## 2019-02-02 DIAGNOSIS — Y939 Activity, unspecified: Secondary | ICD-10-CM | POA: Diagnosis not present

## 2019-02-02 DIAGNOSIS — S0990XA Unspecified injury of head, initial encounter: Secondary | ICD-10-CM | POA: Diagnosis not present

## 2019-02-02 DIAGNOSIS — Y929 Unspecified place or not applicable: Secondary | ICD-10-CM | POA: Diagnosis not present

## 2019-02-02 DIAGNOSIS — Z79899 Other long term (current) drug therapy: Secondary | ICD-10-CM | POA: Diagnosis not present

## 2019-02-02 DIAGNOSIS — F17210 Nicotine dependence, cigarettes, uncomplicated: Secondary | ICD-10-CM | POA: Diagnosis not present

## 2019-02-03 ENCOUNTER — Other Ambulatory Visit: Payer: Self-pay

## 2019-02-03 ENCOUNTER — Emergency Department (HOSPITAL_COMMUNITY)
Admission: EM | Admit: 2019-02-03 | Discharge: 2019-02-03 | Disposition: A | Discharge status: Home / Self Care | Payer: Medicaid Other | Attending: Emergency Medicine | Admitting: Emergency Medicine

## 2019-02-03 ENCOUNTER — Encounter (HOSPITAL_COMMUNITY): Payer: Self-pay | Admitting: Emergency Medicine

## 2019-02-03 ENCOUNTER — Emergency Department (HOSPITAL_COMMUNITY): Payer: Medicaid Other

## 2019-02-03 DIAGNOSIS — F0781 Postconcussional syndrome: Secondary | ICD-10-CM

## 2019-02-03 LAB — CBC WITH DIFFERENTIAL/PLATELET
Abs Immature Granulocytes: 0.04 10*3/uL (ref 0.00–0.07)
Basophils Absolute: 0.1 10*3/uL (ref 0.0–0.1)
Basophils Relative: 1 %
Eosinophils Absolute: 0 10*3/uL (ref 0.0–0.5)
Eosinophils Relative: 0 %
HCT: 43.5 % (ref 36.0–46.0)
Hemoglobin: 14.4 g/dL (ref 12.0–15.0)
Immature Granulocytes: 0 %
Lymphocytes Relative: 24 %
Lymphs Abs: 3.5 10*3/uL (ref 0.7–4.0)
MCH: 28.5 pg (ref 26.0–34.0)
MCHC: 33.1 g/dL (ref 30.0–36.0)
MCV: 86 fL (ref 80.0–100.0)
Monocytes Absolute: 1.4 10*3/uL — ABNORMAL HIGH (ref 0.1–1.0)
Monocytes Relative: 10 %
Neutro Abs: 9.9 10*3/uL — ABNORMAL HIGH (ref 1.7–7.7)
Neutrophils Relative %: 65 %
Platelets: 387 10*3/uL (ref 150–400)
RBC: 5.06 MIL/uL (ref 3.87–5.11)
RDW: 15 % (ref 11.5–15.5)
WBC: 15 10*3/uL — ABNORMAL HIGH (ref 4.0–10.5)
nRBC: 0 % (ref 0.0–0.2)

## 2019-02-03 LAB — BASIC METABOLIC PANEL
Anion gap: 12 (ref 5–15)
BUN: 16 mg/dL (ref 6–20)
CO2: 22 mmol/L (ref 22–32)
Calcium: 9.7 mg/dL (ref 8.9–10.3)
Chloride: 106 mmol/L (ref 98–111)
Creatinine, Ser: 0.76 mg/dL (ref 0.44–1.00)
GFR calc Af Amer: >60 mL/min (ref >60)
GFR calc non Af Amer: >60 mL/min (ref >60)
Glucose, Bld: 100 mg/dL — ABNORMAL HIGH (ref 70–99)
Potassium: 3.6 mmol/L (ref 3.5–5.1)
Sodium: 140 mmol/L (ref 135–145)

## 2019-02-03 LAB — I-STAT BETA HCG BLOOD, ED (MC, WL, AP ONLY): I-stat hCG, quantitative: <5 m[IU]/mL (ref <5)

## 2019-02-03 MED ORDER — KETOROLAC TROMETHAMINE 30 MG/ML IJ SOLN
30.0000 mg | Freq: Once | INTRAMUSCULAR | Status: AC
  Administered 2019-02-03: 30 mg via INTRAVENOUS
  Filled 2019-02-03: qty 1

## 2019-02-03 MED ORDER — SODIUM CHLORIDE 0.9 % IV BOLUS
1000.0000 mL | Freq: Once | INTRAVENOUS | Status: AC
  Administered 2019-02-03: 04:00:00 1000 mL via INTRAVENOUS

## 2019-02-03 MED ORDER — ONDANSETRON HCL 4 MG/2ML IJ SOLN
4.0000 mg | Freq: Once | INTRAMUSCULAR | Status: AC
  Administered 2019-02-03: 4 mg via INTRAVENOUS
  Filled 2019-02-03: qty 2

## 2019-02-03 MED ORDER — ONDANSETRON 4 MG PO TBDP: 4.0000 mg | ORAL_TABLET | Freq: Three times a day (TID) | ORAL | 0 refills | Status: DC | PRN

## 2019-02-03 NOTE — Discharge Instructions (Signed)
Use Zofran for nausea. Get plenty of rest and drink plenty of fluids.   If symptoms last longer than the next 3-4 days, plan to follow up with your doctor for recheck.
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
## 2019-04-21 ENCOUNTER — Ambulatory Visit (INDEPENDENT_AMBULATORY_CARE_PROVIDER_SITE_OTHER): Payer: Medicaid Other

## 2019-04-21 ENCOUNTER — Other Ambulatory Visit: Payer: Self-pay

## 2019-04-21 DIAGNOSIS — Z3042 Encounter for surveillance of injectable contraceptive: Secondary | ICD-10-CM | POA: Diagnosis not present

## 2019-04-21 MED ORDER — MEDROXYPROGESTERONE ACETATE 150 MG/ML IM SUSP: 150.0000 mg | Freq: Once | INTRAMUSCULAR | Status: AC

## 2019-07-14 ENCOUNTER — Ambulatory Visit: Payer: Medicaid Other

## 2019-07-25 DIAGNOSIS — J45909 Unspecified asthma, uncomplicated: Secondary | ICD-10-CM | POA: Insufficient documentation

## 2019-07-25 DIAGNOSIS — H8110 Benign paroxysmal vertigo, unspecified ear: Secondary | ICD-10-CM | POA: Insufficient documentation

## 2019-07-25 DIAGNOSIS — R519 Headache, unspecified: Secondary | ICD-10-CM | POA: Insufficient documentation

## 2019-07-25 DIAGNOSIS — F0781 Postconcussional syndrome: Secondary | ICD-10-CM | POA: Insufficient documentation

## 2019-08-01 ENCOUNTER — Ambulatory Visit (INDEPENDENT_AMBULATORY_CARE_PROVIDER_SITE_OTHER): Payer: Medicaid Other

## 2019-08-01 ENCOUNTER — Other Ambulatory Visit: Payer: Self-pay

## 2019-08-01 DIAGNOSIS — N912 Amenorrhea, unspecified: Secondary | ICD-10-CM

## 2019-08-01 DIAGNOSIS — Z3042 Encounter for surveillance of injectable contraceptive: Secondary | ICD-10-CM | POA: Diagnosis not present

## 2019-08-01 LAB — POCT URINE PREGNANCY: Preg Test, Ur: NEGATIVE

## 2019-08-01 MED ORDER — MEDROXYPROGESTERONE ACETATE 150 MG/ML IM SUSP: 150.0000 mg | INTRAMUSCULAR | 3 refills | Status: DC

## 2019-08-01 MED ORDER — MEDROXYPROGESTERONE ACETATE 150 MG/ML IM SUSP
150.0000 mg | Freq: Once | INTRAMUSCULAR | Status: AC
  Administered 2019-08-01: 150 mg via INTRAMUSCULAR

## 2019-08-01 NOTE — Progress Notes (Signed)
Patient presents today for Depo Provera injection outside of date range. Patient not currently on menses. Pregnancy test performed per protocol (negative).Given IM RUOQ. Patient tolerated well. 

## 2019-09-18 ENCOUNTER — Telehealth: Payer: Self-pay | Admitting: Family Medicine

## 2019-09-18 ENCOUNTER — Other Ambulatory Visit: Payer: Self-pay
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
## 2019-09-19 ENCOUNTER — Ambulatory Visit (LOCAL_COMMUNITY_HEALTH_CENTER): Payer: Self-pay

## 2019-09-19 ENCOUNTER — Other Ambulatory Visit: Payer: Self-pay

## 2019-09-19 DIAGNOSIS — Z111 Encounter for screening for respiratory tuberculosis: Secondary | ICD-10-CM

## 2019-09-22 ENCOUNTER — Other Ambulatory Visit: Payer: Self-pay

## 2019-09-22 ENCOUNTER — Ambulatory Visit (LOCAL_COMMUNITY_HEALTH_CENTER): Payer: Medicaid Other

## 2019-09-22 DIAGNOSIS — Z111 Encounter for screening for respiratory tuberculosis: Secondary | ICD-10-CM

## 2019-09-22 LAB — TB SKIN TEST
Induration: 0 mm
TB Skin Test: NEGATIVE

## 2019-10-09 ENCOUNTER — Other Ambulatory Visit: Payer: Medicaid Other

## 2019-10-24 ENCOUNTER — Ambulatory Visit: Payer: Self-pay

## 2019-10-26 ENCOUNTER — Ambulatory Visit: Payer: Self-pay

## 2019-10-27 ENCOUNTER — Ambulatory Visit: Payer: Medicaid Other

## 2019-10-30 ENCOUNTER — Ambulatory Visit: Payer: Medicaid Other

## 2019-11-06 ENCOUNTER — Ambulatory Visit: Payer: Medicaid Other

## 2019-12-29 ENCOUNTER — Other Ambulatory Visit: Payer: Self-pay

## 2019-12-29 ENCOUNTER — Ambulatory Visit (INDEPENDENT_AMBULATORY_CARE_PROVIDER_SITE_OTHER): Payer: Medicaid Other

## 2019-12-29 ENCOUNTER — Ambulatory Visit: Payer: Medicaid Other

## 2019-12-29 DIAGNOSIS — N912 Amenorrhea, unspecified: Secondary | ICD-10-CM

## 2019-12-29 DIAGNOSIS — Z3042 Encounter for surveillance of injectable contraceptive: Secondary | ICD-10-CM | POA: Diagnosis not present

## 2019-12-29 LAB — POCT URINE PREGNANCY: Preg Test, Ur: NEGATIVE

## 2019-12-29 MED ORDER — MEDROXYPROGESTERONE ACETATE 150 MG/ML IM SUSP
150.0000 mg | Freq: Once | INTRAMUSCULAR | Status: AC
  Administered 2019-12-29: 150 mg via INTRAMUSCULAR

## 2019-12-29 NOTE — Progress Notes (Signed)
Patient presents today for Depo Provera injection outside of date range. Patient not currently on menses. Pregnancy test performed per protocol (negative).Given IM RUOQ. Patient tolerated well.

## 2020-01-02 ENCOUNTER — Other Ambulatory Visit: Payer: Self-pay | Admitting: Obstetrics and Gynecology

## 2020-01-03 ENCOUNTER — Telehealth: Payer: Self-pay | Admitting: Obstetrics and Gynecology

## 2020-01-03 NOTE — Telephone Encounter (Signed)
Called and spoke with patient about scheduling for her annual. Patient is scheduled for 01/29/20 at [place]

## 2020-01-03 NOTE — Telephone Encounter (Signed)
-----   Message from Malachy Mood, MD sent at 01/03/2020  1:21 PM EDT ----- Regarding: appointment annual next 1-2 months otherwise I can't refill beyond the recent refills that have been sent in

## 2020-01-29 ENCOUNTER — Ambulatory Visit: Payer: Medicaid Other | Admitting: Obstetrics and Gynecology

## 2020-01-29 NOTE — Progress Notes (Deleted)
Gynecology Annual Exam   PCP: Perrin Maltese, MD  Chief Complaint: No chief complaint on file.   History of Present Illness: Patient is a 34 y.o. [id number] presents for annual exam. The patient has no complaints today.   LMP: No LMP recorded. Patient has had an injection. Average Interval: {Desc; regular/irreg:14544}, {numbers 22-35:14824} days Duration of flow: {numbers; 0-10:33138} days Heavy Menses: {yes/no:63} Clots: {yes/no:63} Intermenstrual Bleeding: {yes/no:63} Postcoital Bleeding: {yes/no:63} Dysmenorrhea: {yes/no:63}  The patient is sexually active. She currently uses Depo-Provera injections for contraception. She {has/denies:315300} dyspareunia.  The patient {DOES_DOES ZJQ:73419} perform self breast exams.  There {is/is no:19420} notable family history of breast or ovarian cancer in her family.  The patient wears seatbelts: {yes/no:63}.   The patient has regular exercise: {yes/no/not asked:9010}.    The patient {Blank single:19197::"reports","denies"} current symptoms of depression.    Review of Systems: ROS  Past Medical History:  Patient Active Problem List   Diagnosis Date Noted  . Uterine scar from previous cesarean delivery affecting pregnancy 10/20/2018  . S/P cesarean section 10/20/2018  . Indication for care in labor and delivery, antepartum 10/12/2018  . Polyhydramnios affecting pregnancy in third trimester 10/04/2018  . Abdominal pain during pregnancy in third trimester 09/25/2018  . Polyhydramnios in third trimester 09/23/2018  . History of cesarean section complicating pregnancy 37/90/2409  . Right upper quadrant abdominal pain affecting pregnancy in third trimester 09/04/2018  . Anxiety   . Chronic hypertension affecting pregnancy 06/09/2018    [ ]  Aspirin 81 mg daily after 12 weeks; discontinue after 36 weeks [ ]  baseline labs with CBC, CMP, urine protein/creatinine ratio [ ]  no BP meds unless BPs become elevated [ ]  ultrasound for growth at 28,  32, 36 weeks [ ]  Aspirin 81 mg daily after 12 weeks; discontinue after 36 weeks [ ]  Baseline EKG   Current antihypertensives:  Labetalol 100 mg bid  Baseline and surveillance labs (pulled in from [facility], refresh links as needed)  Lab Results  Component Value Date   PLT 277 04/12/2018   CREATININE 0.57 04/12/2018   AST 17 04/12/2018   ALT 13 04/12/2018    Antenatal Testing CHTN - O10.919  Group I  BP < 140/90, no preeclampsia, AGA,  nml AFV, +/- meds    Group II BP > 140/90, on meds, no preeclampsia, AGA, nml AFV  20-28-34-38  20-24-28-32-35-38  32//2 x wk  28//BPP wkly then 32//2 x wk  40 no meds; 39 meds  PRN or 37  Pre-eclampsia  GHTN - O13.9/Preeclampsia without severe features  - O14.00   Preeclampsia with severe features - O14.10  Q 3-4wks  Q 2 wks  28//BPP wkly then 32//2 x wk  Inpatient  37  PRN or 34      . Tobacco use affecting pregnancy, antepartum 05/10/2018  . History of pre-eclampsia in prior pregnancy, currently pregnant 05/10/2018  . Supervision of high risk pregnancy, antepartum 03/23/2018    Clinic [facility] Prenatal Labs  Dating  5wk [place] Blood type: AB/Positive/-- (02/04 1650)   Genetic Screen  NIPS: normal xy Antibody:Negative (02/04 1650)  Anatomic [place] complete Rubella: 12.40 (02/04 1650) Varicella: Immune  GTT Early:99 Third trimester: 158 3-hr 83, 162, 120, 113 RPR: Non Reactive (02/04 1650)   Rhogam  not needed HBsAg: Negative (02/04 1650)   TDaP vaccine   Flu Shot: declines HIV: Non Reactive (02/04 1650)   Baby Food  GBS:   Contraception  Pap: 2019 NIL  CBB     CS/VBAC 2013- desires repeat   Support Person Donyell       . Obesity affecting pregnancy, antepartum 03/23/2018  . Hypertension 02/18/2018  . Major depressive disorder 02/18/2018    Past Surgical History:  Past Surgical History:  Procedure Laterality Date  . BREAST LUMPECTOMY Left 07/01/2015   Procedure: BREAST LUMPECTOMY;  Surgeon:  Christene Lye, MD;  Location: [facility] ORS;  Service: General;  Laterality: Left;  . CESAREAN SECTION  05/11/2011   fetal intolerance  . CESAREAN SECTION N/A 10/20/2018   Procedure: CESAREAN SECTION;  Surgeon: Malachy Mood, MD;  Location: [facility] ORS;  Service: Obstetrics;  Laterality: N/A;  . FOOT SURGERY      Gynecologic History:  No LMP recorded. Patient has had an injection. Contraception: Depo-Provera injections Last Pap: Results were:03/23/2018 NIL and HR HPV negative   Obstetric History: [id number]  Family History:  Family History  Problem Relation Age of Onset  . Thyroid cancer Maternal Grandmother     Social History:  Social History   Socioeconomic History  . Marital status: Single    Spouse name: Not on file  . Number of children: Not on file  . Years of education: Not on file  . Highest education level: Not on file  Occupational History  . Not on file  Tobacco Use  . Smoking status: Current Every Day Smoker    Packs/day: 0.25    Years: 13.00    Pack years: 3.25    Types: Cigarettes  . Smokeless tobacco: Never Used  Vaping Use  . Vaping Use: Never used  Substance and Sexual Activity  . Alcohol use: Not Currently    Alcohol/week: 0.0 standard drinks    Comment: occ  . Drug use: No  . Sexual activity: Yes  Other Topics Concern  . Not on file  Social History Narrative  . Not on file   Social Determinants of Health   Financial Resource Strain:   . Difficulty of Paying Living Expenses: Not on file  Food Insecurity:   . Worried About Charity fundraiser in the Last Year: Not on file  . Ran Out of Food in the Last Year: Not on file  Transportation Needs:   . Lack of Transportation (Medical): Not on file  . Lack of Transportation (Non-Medical): Not on file  Physical Activity:   . Days of Exercise per Week: Not on file  . Minutes of Exercise per Session: Not on file  Stress:   . Feeling of Stress : Not on file  Social Connections:   . Frequency of  Communication with Friends and Family: Not on file  . Frequency of Social Gatherings with Friends and Family: Not on file  . Attends Religious Services: Not on file  . Active Member of Clubs or Organizations: Not on file  . Attends Archivist Meetings: Not on file  . Marital Status: Not on file  Intimate Partner Violence:   . Fear of Current or Ex-Partner: Not on file  . Emotionally Abused: Not on file  . Physically Abused: Not on file  . Sexually Abused: Not on file    Allergies:  Allergies  Allergen Reactions  . Erythromycin Base Other (See Comments)    Stomach cramps  . Adhesive [Tape] Other (See Comments)    Skin becomes raw  . Nickel Rash    Medications: Prior to Admission medications   Medication Sig Start Date  End Date Taking? Authorizing Provider  escitalopram (LEXAPRO) 10 MG tablet TAKE 1 TABLET(10 MG) BY MOUTH DAILY 01/03/20   Malachy Mood, MD  famotidine (PEPCID) 20 MG tablet Take 1 tablet (20 mg total) by mouth 2 (two) times daily. Patient taking differently: Take 20 mg by mouth daily.  07/07/18   Rexene Agent, CNM  labetalol (NORMODYNE) 300 MG tablet Take 1 tablet (300 mg total) by mouth 2 (two) times daily. 12/16/18   Malachy Mood, MD  medroxyPROGESTERone (DEPO-PROVERA) 150 MG/ML injection Inject 1 mL (150 mg total) into the muscle every 3 (three) months. 08/01/19 10/30/19  Malachy Mood, MD  ondansetron (ZOFRAN ODT) 4 MG disintegrating tablet Take 1 tablet (4 mg total) by mouth every 8 (eight) hours as needed for nausea or vomiting. 02/03/19   Charlann Lange, PA-C  Prenat-FeFmCb-DSS-FA-DHA w/o A (CITRANATAL HARMONY) 27-1-260 MG CAPS Take 1 tablet by mouth daily. Patient not taking: Reported on 02/03/2019 03/30/18   Gae Dry, MD  sucralfate (CARAFATE) 1 g tablet Take 1 tablet (1 g total) by mouth 4 (four) times daily. Patient not taking: Reported on 02/03/2019 10/04/18 10/04/19  Homero Fellers, MD  traZODone (DESYREL) 50 MG tablet  Take 1 tablet (50 mg total) by mouth at bedtime as needed for sleep. 09/04/18   Malachy Mood, MD    Physical Exam Vitals: ***  General: NAD HEENT: normocephalic, anicteric Thyroid: no enlargement, no palpable nodules Pulmonary: No increased work of breathing, CTAB Cardiovascular: RRR, distal pulses 2+ Breast: Breast symmetrical, no tenderness, no palpable nodules or masses, no skin or nipple retraction present, no nipple discharge.  No axillary or supraclavicular lymphadenopathy. Abdomen: NABS, soft, non-tender, non-distended.  Umbilicus without lesions.  No hepatomegaly, splenomegaly or masses palpable. No evidence of hernia  Genitourinary:  External: Normal external female genitalia.  Normal urethral meatus, normal Bartholin's and Skene's glands.    Vagina: Normal vaginal mucosa, no evidence of prolapse.    Cervix: Grossly normal in appearance, no bleeding  Uterus: Non-enlarged, mobile, normal contour.  No CMT  Adnexa: ovaries non-enlarged, no adnexal masses  Rectal: deferred  Lymphatic: no evidence of inguinal lymphadenopathy Extremities: no edema, erythema, or tenderness Neurologic: Grossly intact Psychiatric: mood appropriate, affect full  Female chaperone present for pelvic and breast  portions of the physical exam    Assessment: 34 y.o. [id number] routine annual exam  Plan: Problem List Items Addressed This Visit    None    Visit Diagnoses    Encounter for gynecological examination without abnormal finding    -  Primary   Breast screening       Encounter for surveillance of injectable contraceptive          2) STI screening  {Blank single:19197::"was","was not"}offered and {Blank single:19197::"accepted","declined","therefore not obtained"}  2)  ASCCP guidelines and rational discussed.  Patient opts for {Blank single:19197::"every 5 years","every 3 years","yearly","discontinue age >65","discontinue secondary to prior hysterectomy"} screening interval  3)  Contraception - the patient is currently using  {method:5051}.  She is {Blank single:19197::"happy with her current form of contraception and plans to continue","interested in changing to ***","interested in starting Contraception: ***","not currently in need of contraception secondary to being sterile","attempting to conceive in the near future"}  4) Routine healthcare maintenance including cholesterol, diabetes screening discussed {Blank single:19197::"managed by PCP","Ordered today","To return fasting at a later date","Declines"}  5) No follow-ups on file.   Malachy Mood, MD, [facility], [facility] 01/29/2020, 1:30 PM

## 2020-03-06 ENCOUNTER — Ambulatory Visit: Payer: Medicaid Other | Admitting: Obstetrics and Gynecology

## 2020-03-12 ENCOUNTER — Emergency Department
Admission: EM | Admit: 2020-03-12 | Discharge: 2020-03-12 | Disposition: A | Discharge status: Home / Self Care | Payer: Medicaid Other | Attending: Emergency Medicine | Admitting: Emergency Medicine

## 2020-03-12 ENCOUNTER — Other Ambulatory Visit: Payer: Self-pay

## 2020-03-12 DIAGNOSIS — Z79899 Other long term (current) drug therapy: Secondary | ICD-10-CM | POA: Insufficient documentation

## 2020-03-12 DIAGNOSIS — F17210 Nicotine dependence, cigarettes, uncomplicated: Secondary | ICD-10-CM | POA: Diagnosis not present

## 2020-03-12 DIAGNOSIS — I10 Essential (primary) hypertension: Secondary | ICD-10-CM | POA: Insufficient documentation

## 2020-03-12 DIAGNOSIS — U071 COVID-19: Secondary | ICD-10-CM | POA: Insufficient documentation

## 2020-03-12 DIAGNOSIS — R059 Cough, unspecified: Secondary | ICD-10-CM | POA: Diagnosis present

## 2020-03-12 DIAGNOSIS — B349 Viral infection, unspecified: Secondary | ICD-10-CM

## 2020-03-12 LAB — RESP PANEL BY RT-PCR (FLU A&B, COVID) ARPGX2
Influenza A by PCR: NEGATIVE
Influenza B by PCR: NEGATIVE
SARS Coronavirus 2 by RT PCR: POSITIVE — AB

## 2020-03-12 MED ORDER — IBUPROFEN 600 MG PO TABS: 600.0000 mg | ORAL_TABLET | Freq: Three times a day (TID) | ORAL | 0 refills | Status: AC | PRN

## 2020-03-12 MED ORDER — BENZONATATE 100 MG PO CAPS
200.0000 mg | ORAL_CAPSULE | Freq: Once | ORAL | Status: AC
  Administered 2020-03-12: 200 mg via ORAL
  Filled 2020-03-12: qty 2

## 2020-03-12 MED ORDER — PSEUDOEPH-BROMPHEN-DM 30-2-10 MG/5ML PO SYRP: 5.0000 mL | ORAL_SOLUTION | Freq: Four times a day (QID) | ORAL | 0 refills | Status: DC | PRN

## 2020-03-12 MED ORDER — KETOROLAC TROMETHAMINE 60 MG/2ML IM SOLN
60.0000 mg | Freq: Once | INTRAMUSCULAR | Status: AC
  Administered 2020-03-12: 60 mg via INTRAMUSCULAR
  Filled 2020-03-12: qty 2

## 2020-03-12 NOTE — Discharge Instructions (Signed)
You have close contact with a family member who tested positive for COVID-19.  Test results are still pending.  You may find the results of your COVID-19 test in the MyChart app.  This should be available within the next 3 to 4 hours.  Follow discharge care instructions.  If test is positive must quarantine for additional 10 days.

## 2020-03-12 NOTE — ED Triage Notes (Signed)
Pt comes via POV from home with c/o generalized body aches. Pt states this started few days ago. Pt states fever, cough and chills.

## 2020-03-12 NOTE — ED Notes (Signed)
Patient is complaining of cough and headache for multiple days with body aches.

## 2020-03-12 NOTE — ED Provider Notes (Signed)
[facility] Emergency Department Provider Note   ____________________________________________   Event Date/Time   First MD Initiated Contact with Patient 03/12/20 [phone]     (approximate)  I have reviewed the triage vital signs and the nursing notes.   HISTORY  Chief Complaint Generalized Body Aches    HPI Carol Small is a 35 y.o. female patient presents with body aches, nasal congestion, sore throat, and cough.  Onset of complaint was 3 to 4 days ago.  Patient denies recent travel or known contact with COVID-19.  Patient took her last Covid injection in March 2021.  Patient as not taken the booster.  Patient as not taken the flu shot.  Rates her pain/discomfort as a 6/10.  Describes pain/discomfort as "achy".  No palliative measure for complaint.  It was noted patient blood pressure elevated 173/130.  Patient says she took her blood pressure medicine approximate hour ago.  Advised patient will retake her blood pressure.         Past Medical History:  Diagnosis Date  . Anxiety   . GERD (gastroesophageal reflux disease)   . Hypertension    NO MEDS-NEVER FOLLOWED BACK UP WITH PCP  . S/P LEEP of cervix     Patient Active Problem List   Diagnosis Date Noted  . Uterine scar from previous cesarean delivery affecting pregnancy 10/20/2018  . S/P cesarean section 10/20/2018  . Indication for care in labor and delivery, antepartum 10/12/2018  . Polyhydramnios affecting pregnancy in third trimester 10/04/2018  . Abdominal pain during pregnancy in third trimester 09/25/2018  . Polyhydramnios in third trimester 09/23/2018  . History of cesarean section complicating pregnancy 09/23/2018  . Right upper quadrant abdominal pain affecting pregnancy in third trimester 09/04/2018  . Anxiety   . Chronic hypertension affecting pregnancy 06/09/2018  . Tobacco use affecting pregnancy, antepartum 05/10/2018  . History of pre-eclampsia in prior pregnancy,  currently pregnant 05/10/2018  . Supervision of high risk pregnancy, antepartum 03/23/2018  . Obesity affecting pregnancy, antepartum 03/23/2018  . Hypertension 02/18/2018  . Major depressive disorder 02/18/2018    Past Surgical History:  Procedure Laterality Date  . BREAST LUMPECTOMY Left 07/01/2015   Procedure: BREAST LUMPECTOMY;  Surgeon: Kieth Brightly, MD;  Location: [facility] ORS;  Service: General;  Laterality: Left;  . CESAREAN SECTION  05/11/2011   fetal intolerance  . CESAREAN SECTION N/A 10/20/2018   Procedure: CESAREAN SECTION;  Surgeon: Vena Austria, MD;  Location: [facility] ORS;  Service: Obstetrics;  Laterality: N/A;  . FOOT SURGERY      Prior to Admission medications   Medication Sig Start Date End Date Taking? Authorizing Provider  brompheniramine-pseudoephedrine-DM 30-2-10 MG/5ML syrup Take 5 mLs by mouth 4 (four) times daily as needed. 03/12/20  Yes Joni Reining, PA-C  ibuprofen (ADVIL) 600 MG tablet Take 1 tablet (600 mg total) by mouth every 8 (eight) hours as needed. 03/12/20  Yes Joni Reining, PA-C  escitalopram (LEXAPRO) 10 MG tablet TAKE 1 TABLET(10 MG) BY MOUTH DAILY 01/03/20   Vena Austria, MD  famotidine (PEPCID) 20 MG tablet Take 1 tablet (20 mg total) by mouth 2 (two) times daily. Patient taking differently: Take 20 mg by mouth daily.  07/07/18   Oswaldo Conroy, CNM  labetalol (NORMODYNE) 300 MG tablet Take 1 tablet (300 mg total) by mouth 2 (two) times daily. 12/16/18   Vena Austria, MD  medroxyPROGESTERone (DEPO-PROVERA) 150 MG/ML injection Inject 1 mL (150 mg total) into the muscle every 3 (  three) months. 08/01/19 10/30/19  Malachy Mood, MD  ondansetron (ZOFRAN ODT) 4 MG disintegrating tablet Take 1 tablet (4 mg total) by mouth every 8 (eight) hours as needed for nausea or vomiting. 02/03/19   Charlann Lange, PA-C  Prenat-FeFmCb-DSS-FA-DHA w/o A (CITRANATAL HARMONY) 27-1-260 MG CAPS Take 1 tablet by mouth daily. Patient not taking:  Reported on 02/03/2019 03/30/18   Gae Dry, MD  sucralfate (CARAFATE) 1 g tablet Take 1 tablet (1 g total) by mouth 4 (four) times daily. Patient not taking: Reported on 02/03/2019 10/04/18 10/04/19  Homero Fellers, MD  traZODone (DESYREL) 50 MG tablet Take 1 tablet (50 mg total) by mouth at bedtime as needed for sleep. 09/04/18   Malachy Mood, MD    Allergies Erythromycin base, Adhesive [tape], and Nickel  Family History  Problem Relation Age of Onset  . Thyroid cancer Maternal Grandmother     Social History Social History   Tobacco Use  . Smoking status: Current Every Day Smoker    Packs/day: 0.25    Years: 13.00    Pack years: 3.25    Types: Cigarettes  . Smokeless tobacco: Never Used  Vaping Use  . Vaping Use: Never used  Substance Use Topics  . Alcohol use: Not Currently    Alcohol/week: 0.0 standard drinks    Comment: occ  . Drug use: No    Review of Systems  Constitutional: Body aches.  Chills. Eyes: No visual changes. ENT: No sore throat. Cardiovascular: Denies chest pain. Respiratory: Denies shortness of breath.  Nonproductive cough. Gastrointestinal: No abdominal pain.  No nausea, no vomiting.  No diarrhea.  No constipation. Genitourinary: Negative for dysuria. Musculoskeletal: Negative for back pain. Skin: Negative for rash. Neurological: Positive for headaches, but denies focal weakness or numbness. Psychiatric:  Anxiety and depression. Endocrine:  Hypertension Allergic/Immunilogical: Adhesive tape, erythromycin, and nickel.  ____________________________________________   PHYSICAL EXAM:  VITAL SIGNS: ED Triage Vitals  Enc Vitals Group     BP 03/12/20 0803 (!) 173/130     Pulse Rate 03/12/20 0803 (!) 113     Resp 03/12/20 0803 18     Temp 03/12/20 0803 99 F (37.2 C)     Temp src --      SpO2 03/12/20 0803 100 %     Weight --      Height --      Head Circumference --      Peak Flow --      Pain Score 03/12/20 0801 6      Pain Loc --      Pain Edu? --      Excl. in [place]? --     Constitutional: Alert and oriented. Well appearing and in no acute distress. Eyes: Conjunctivae are normal. PERRL. EOMI. Head: Atraumatic. Nose: Edematous nasal turbinates clear rhinorrhea. Mouth/Throat: Mucous membranes are moist.  Oropharynx non-erythematous.  Postnasal drainage. Neck: No stridor. Hematological/Lymphatic/Immunilogical: No cervical lymphadenopathy. Cardiovascular: Tachycardic, regular rhythm. Grossly normal heart sounds.  Good peripheral circulation.  Elevated blood pressure Respiratory: Normal respiratory effort.  No retractions. Lungs CTAB. Gastrointestinal: Soft and nontender. No distention. No abdominal bruits. No CVA tenderness. Genitourinary: Deferred Skin:  Skin is warm, dry and intact. No rash noted. Psychiatric: Mood and affect are normal. Speech and behavior are normal.  ____________________________________________   LABS (all labs ordered are listed, but only abnormal results are displayed)  Labs Reviewed  RESP PANEL BY RT-PCR (FLU A&B, COVID) [id number]   ____________________________________________  EKG   ____________________________________________  RADIOLOGY I,  Sable Feil, personally viewed and evaluated these images (plain radiographs) as part of my medical decision making, as well as reviewing the written report by the radiologist.  ED MD interpretation:   Official radiology report(s): No results found.  ____________________________________________   PROCEDURES  Procedure(s) performed (including Critical Care):  Procedures   ____________________________________________   INITIAL IMPRESSION / ASSESSMENT AND PLAN / ED COURSE  As part of my medical decision making, I reviewed the following data within the [place]         Patient presents with complaint of headache, body aches, and cough for several days.  Patient has family member who tested positive  for COVID-19 today.  Patient complaining physical exam is consistent with viral illness.  Patient advised to self quarantine pending results of COVID-19 test.  Test results may be found in the MyChart app later today.  Patient may return back to work tomorrow if test is negative.  Advised to consider taking a booster shot.      ____________________________________________   FINAL CLINICAL IMPRESSION(S) / ED DIAGNOSES  Final diagnoses:  Viral illness     ED Discharge Orders         Ordered    brompheniramine-pseudoephedrine-DM 30-2-10 MG/5ML syrup  4 times daily PRN        03/12/20 1153    ibuprofen (ADVIL) 600 MG tablet  Every 8 hours PRN        03/12/20 1153          *Please note:  Carol Small was evaluated in Emergency Department on 03/12/2020 for the symptoms described in the history of present illness. She was evaluated in the context of the global COVID-19 pandemic, which necessitated consideration that the patient might be at risk for infection with the SARS-CoV-2 virus that causes COVID-19. Institutional protocols and algorithms that pertain to the evaluation of patients at risk for today.  Are in a state of rapid change based on information released by regulatory bodies including the CDC and federal and state organizations. These policies and algorithms were followed during the patient's care in the ED.  Some ED evaluations and interventions may be delayed as a result of limited staffing during and the pandemic.*   Note:  This document was prepared using Dragon voice recognition software and may include unintentional dictation errors.    Sable Feil, PA-C 03/12/20 1155    Duffy Bruce, MD 03/13/20 [phone]

## 2020-03-13 NOTE — ED Provider Notes (Signed)
Patient calls and reports that she is tried every over-the-counter cough syrup she can think of and none of them are working.  She is coughing all night long and cannot sleep.  I have called her in Tessalon Perles 100 mg pills 2 pills 3 times a day for 10 days.  I have also personally called her and warned her to swallow them and not to suck on them and ask sucking on them can make her sick.  Pharmacist will also include this morning and the prescription.  Patient is otherwise doing well and her children are doing well or eating well she will return for any further problems.   Arnaldo Natal, MD 03/13/20 [phone]

## 2020-03-22 ENCOUNTER — Telehealth: Payer: Self-pay

## 2020-03-22 ENCOUNTER — Ambulatory Visit: Payer: Medicaid Other

## 2020-03-22 NOTE — Telephone Encounter (Signed)
Pt scheduled on 03/25/20 for Annual/depo injection. Pt having issue with prescription being refilled. Could you please refill by apt date.

## 2020-03-25 ENCOUNTER — Ambulatory Visit: Payer: Medicaid Other | Admitting: Obstetrics and Gynecology

## 2020-04-04 ENCOUNTER — Encounter: Payer: Self-pay | Admitting: Obstetrics and Gynecology

## 2020-04-04 ENCOUNTER — Ambulatory Visit (INDEPENDENT_AMBULATORY_CARE_PROVIDER_SITE_OTHER): Payer: Medicaid Other | Admitting: Obstetrics and Gynecology

## 2020-04-04 ENCOUNTER — Other Ambulatory Visit: Payer: Self-pay

## 2020-04-04 VITALS — BP 134/74 | Ht 64.0 in | Wt 188.1 lb

## 2020-04-04 DIAGNOSIS — R140 Abdominal distension (gaseous): Secondary | ICD-10-CM

## 2020-04-04 DIAGNOSIS — Z3042 Encounter for surveillance of injectable contraceptive: Secondary | ICD-10-CM | POA: Diagnosis not present

## 2020-04-04 DIAGNOSIS — Z01419 Encounter for gynecological examination (general) (routine) without abnormal findings: Secondary | ICD-10-CM

## 2020-04-04 DIAGNOSIS — Z1239 Encounter for other screening for malignant neoplasm of breast: Secondary | ICD-10-CM

## 2020-04-04 DIAGNOSIS — N939 Abnormal uterine and vaginal bleeding, unspecified: Secondary | ICD-10-CM | POA: Diagnosis not present

## 2020-04-04 DIAGNOSIS — R1032 Left lower quadrant pain: Secondary | ICD-10-CM

## 2020-04-04 LAB — POCT URINE PREGNANCY: Preg Test, Ur: NEGATIVE

## 2020-04-04 MED ORDER — MEDROXYPROGESTERONE ACETATE 150 MG/ML IM SUSP
150.0000 mg | Freq: Once | INTRAMUSCULAR | Status: AC
  Administered 2020-04-04: 150 mg via INTRAMUSCULAR

## 2020-04-04 MED ORDER — MEDROXYPROGESTERONE ACETATE 150 MG/ML IM SUSP: 150.0000 mg | INTRAMUSCULAR | 3 refills | Status: DC

## 2020-04-04 NOTE — Progress Notes (Signed)
Gynecology Annual Exam   PCP: Perrin Maltese, MD  Chief Complaint:  Chief Complaint  Patient presents with  . Gynecologic Exam    Annual - Lt side pain thinks she has a cyst on ovary, Depo injection. RM 3    History of Present Illness: Patient is a 35 y.o. [id number] presents for annual exam. The patient has no complaints today.   LMP: Patient's last menstrual period was 03/09/2020 (exact date). Irregular bleeding past 3 week coinciding with recent COVID diagnosis prior amenorrhea on Depo provera.    The patient is sexually active. She currently uses Depo-Provera injections for contraception. She denies dyspareunia.  There is no notable family history of breast or ovarian cancer in her family.  The patient wears seatbelts: yes.   The patient has regular exercise: not asked.    The patient denies current symptoms of depression.    Review of Systems: Review of Systems  Constitutional: Negative for chills and fever.  HENT: Negative for congestion.   Respiratory: Negative for cough and shortness of breath.   Cardiovascular: Negative for chest pain and palpitations.  Gastrointestinal: Negative for abdominal pain, constipation, diarrhea, heartburn, nausea and vomiting.  Genitourinary: Negative for dysuria, frequency and urgency.  Skin: Negative for itching and rash.  Neurological: Negative for dizziness and headaches.  Endo/Heme/Allergies: Negative for polydipsia.  Psychiatric/Behavioral: Negative for depression.   Past Medical History:  Patient Active Problem List   Diagnosis Date Noted  . Uterine scar from previous cesarean delivery affecting pregnancy 10/20/2018  . S/P cesarean section 10/20/2018  . Indication for care in labor and delivery, antepartum 10/12/2018  . Polyhydramnios affecting pregnancy in third trimester 10/04/2018  . Abdominal pain during pregnancy in third trimester 09/25/2018  . Polyhydramnios in third trimester 09/23/2018  . History of cesarean section  complicating pregnancy 0000000  . Right upper quadrant abdominal pain affecting pregnancy in third trimester 09/04/2018  . Anxiety   . Chronic hypertension affecting pregnancy 06/09/2018    [ ]  Aspirin 81 mg daily after 12 weeks; discontinue after 36 weeks [ ]  baseline labs with CBC, CMP, urine protein/creatinine ratio [ ]  no BP meds unless BPs become elevated [ ]  ultrasound for growth at 28, 32, 36 weeks [ ]  Aspirin 81 mg daily after 12 weeks; discontinue after 36 weeks [ ]  Baseline EKG   Current antihypertensives:  Labetalol 100 mg bid  Baseline and surveillance labs (pulled in from [facility], refresh links as needed)  Lab Results  Component Value Date   PLT 277 04/12/2018   CREATININE 0.57 04/12/2018   AST 17 04/12/2018   ALT 13 04/12/2018    Antenatal Testing [facility] - O10.919  Group I  BP < 140/90, no preeclampsia, AGA,  nml AFV, +/- meds    Group II BP > 140/90, on meds, no preeclampsia, AGA, nml AFV  20-28-34-38  20-24-28-32-35-38  32//2 x wk  28//BPP wkly then 32//2 x wk  40 no meds; 39 meds  PRN or 37  Pre-eclampsia  GHTN - O13.9/Preeclampsia without severe features  - O14.00   Preeclampsia with severe features - O14.10  Q 3-4wks  Q 2 wks  28//BPP wkly then 32//2 x wk  Inpatient  37  PRN or 34      . Tobacco use affecting pregnancy, antepartum 05/10/2018  . History of pre-eclampsia in prior pregnancy, currently pregnant 05/10/2018  . Supervision of high risk pregnancy, antepartum 03/23/2018    Cl[facility] Prenatal Labs  Dating  5wk [place]  Blood type: AB/Positive/-- (02/04 1650)   Genetic Screen  NIPS: normal xy Antibody:Negative (02/04 1650)  Anatomic [place] complete Rubella: 12.40 (02/04 1650) Varicella: Immune  GTT Early:99 Third trimester: 158 3-hr 83, 162, 120, 113 RPR: Non Reactive (02/04 1650)   Rhogam  not needed HBsAg: Negative (02/04 1650)   TDaP vaccine   Flu Shot: declines HIV: Non Reactive (02/04 1650)   Baby Food                                 GBS:   Contraception  Pap: 2019 NIL  CBB     CS/VBAC 2013- desires repeat   Support Person Donyell       . Obesity affecting pregnancy, antepartum 03/23/2018  . Hypertension 02/18/2018  . Major depressive disorder 02/18/2018    Past Surgical History:  Past Surgical History:  Procedure Laterality Date  . BREAST LUMPECTOMY Left 07/01/2015   Procedure: BREAST LUMPECTOMY;  Surgeon: Christene Lye, MD;  Location: [facility] ORS;  Service: General;  Laterality: Left;  . CESAREAN SECTION  05/11/2011   fetal intolerance  . CESAREAN SECTION N/A 10/20/2018   Procedure: CESAREAN SECTION;  Surgeon: Malachy Mood, MD;  Location: [facility] ORS;  Service: Obstetrics;  Laterality: N/A;  . FOOT SURGERY      Gynecologic History:  Patient's last menstrual period was 03/09/2020 (exact date). Contraception: Depo-Provera injections Last Pap: Results were:03/23/2018 NIL and HR HPV negative   Obstetric History: [id number]  Family History:  Family History  Problem Relation Age of Onset  . Thyroid cancer Maternal Grandmother   . Hypertension Mother   . Hypertension Father     Social History:  Social History   Socioeconomic History  . Marital status: Single    Spouse name: Not on file  . Number of children: Not on file  . Years of education: Not on file  . Highest education level: Not on file  Occupational History  . Not on file  Tobacco Use  . Smoking status: Current Every Day Smoker    Packs/day: 0.25    Years: 13.00    Pack years: 3.25    Types: Cigarettes  . Smokeless tobacco: Never Used  Vaping Use  . Vaping Use: Never used  Substance and Sexual Activity  . Alcohol use: Not Currently    Alcohol/week: 0.0 standard drinks    Comment: occ  . Drug use: No  . Sexual activity: Yes    Birth control/protection: Injection  Other Topics Concern  . Not on file  Social History Narrative  . Not on file   Social Determinants of Health   Financial Resource Strain: Not on file  Food  Insecurity: Not on file  Transportation Needs: Not on file  Physical Activity: Not on file  Stress: Not on file  Social Connections: Not on file  Intimate Partner Violence: Not on file    Allergies:  Allergies  Allergen Reactions  . Erythromycin Base Other (See Comments)    Stomach cramps  . Adhesive [Tape] Other (See Comments)    Skin becomes raw  . Nickel Rash    Medications: Prior to Admission medications   Medication Sig Start Date End Date Taking? Authorizing Provider  amLODipine (NORVASC) 10 MG tablet Take by mouth. 07/25/19 07/24/20 Yes [provider]  chlorthalidone (HYGROTON) 25 MG tablet Take by mouth. 06/02/19 06/01/20 Yes [provider]  escitalopram (LEXAPRO) 10 MG tablet TAKE 1 TABLET(10 MG) BY  MOUTH DAILY 01/03/20  Yes Malachy Mood, MD  ibuprofen (ADVIL) 600 MG tablet Take 1 tablet (600 mg total) by mouth every 8 (eight) hours as needed. 03/12/20  Yes Sable Feil, PA-C  labetalol (NORMODYNE) 300 MG tablet Take 1 tablet (300 mg total) by mouth 2 (two) times daily. 12/16/18  Yes Malachy Mood, MD  traZODone (DESYREL) 50 MG tablet Take 1 tablet (50 mg total) by mouth at bedtime as needed for sleep. 09/04/18  Yes Malachy Mood, MD  brompheniramine-pseudoephedrine-DM 30-2-10 MG/5ML syrup Take 5 mLs by mouth 4 (four) times daily as needed. Patient not taking: Reported on 04/04/2020 03/12/20   Sable Feil, PA-C  famotidine (PEPCID) 20 MG tablet Take 1 tablet (20 mg total) by mouth 2 (two) times daily. Patient not taking: Reported on 04/04/2020 07/07/18   Rexene Agent, CNM  medroxyPROGESTERone (DEPO-PROVERA) 150 MG/ML injection Inject 1 mL (150 mg total) into the muscle every 3 (three) months. 08/01/19 10/30/19  Malachy Mood, MD  ondansetron (ZOFRAN ODT) 4 MG disintegrating tablet Take 1 tablet (4 mg total) by mouth every 8 (eight) hours as needed for nausea or vomiting. Patient not taking: Reported on 04/04/2020 02/03/19   Charlann Lange,  PA-C  Prenat-FeFmCb-DSS-FA-DHA w/o A (CITRANATAL HARMONY) 27-1-260 MG CAPS Take 1 tablet by mouth daily. Patient not taking: Reported on 02/03/2019 03/30/18   Gae Dry, MD  sucralfate (CARAFATE) 1 g tablet Take 1 tablet (1 g total) by mouth 4 (four) times daily. Patient not taking: Reported on 02/03/2019 10/04/18 10/04/19  Homero Fellers, MD    Physical Exam Vitals: Blood pressure 134/74, height 5\' 4"  (1.626 m), weight 188 lb 2 oz (85.3 kg), last menstrual period 03/09/2020, not currently breastfeeding.  General: NAD HEENT: normocephalic, anicteric Thyroid: no enlargement, no palpable nodules Pulmonary: No increased work of breathing, CTAB Cardiovascular: RRR, distal pulses 2+ Breast: Breast symmetrical, no tenderness, no palpable nodules or masses, no skin or nipple retraction present, no nipple discharge.  No axillary or supraclavicular lymphadenopathy. Abdomen: NABS, soft, non-tender, non-distended.  Umbilicus without lesions.  No hepatomegaly, splenomegaly or masses palpable. No evidence of hernia  Genitourinary:  External: Normal external female genitalia.  Normal urethral meatus, normal Bartholin's and Skene's glands.    Vagina: Normal vaginal mucosa, no evidence of prolapse.    Cervix: Grossly normal in appearance, no bleeding  Uterus: Non-enlarged, mobile, normal contour.  No CMT  Adnexa: ovaries non-enlarged, no adnexal masses  Rectal: deferred  Lymphatic: no evidence of inguinal lymphadenopathy Extremities: no edema, erythema, or tenderness Neurologic: Grossly intact Psychiatric: mood appropriate, affect full  Female chaperone present for pelvic and breast  portions of the physical exam  Immunization History  Administered Date(s) Administered  . HPV Quadrivalent 04/02/2006  . Hepatitis A, Adult 12/08/2004  . Hepatitis B, adult 09/27/2000, 11/12/2000, 09/13/2001  . Meningococcal Conjugate 12/08/2004  . PPD Test 09/19/2019  . Td 09/27/2000  . Tdap  12/08/2004, 09/01/2018     Assessment: 35 y.o. [id number] routine annual exam  Plan: Problem List Items Addressed This Visit   None   Visit Diagnoses    Encounter for Depo-Provera contraception    -  Primary   Relevant Orders   POCT urine pregnancy (Completed)   Left lower quadrant pain       Relevant Orders   US PELVIS TRANSVAGINAL NON-OB (TV ONLY)   Abdominal distention       Relevant Orders   US PELVIS TRANSVAGINAL NON-OB (TV ONLY)   Abnormal uterine bleeding  Relevant Orders   US PELVIS TRANSVAGINAL NON-OB (TV ONLY)   Encounter for gynecological examination without abnormal finding       Breast screening       Encounter for surveillance of injectable contraceptive          2) STI screening  wasoffered and declined  2)  ASCCP guidelines and rational discussed.  Patient opts for every 3 years screening interval  3) Contraception - the patient is currently using  Depo-Provera injections.  She is happy with her current form of contraception and plans to continue  4) Routine healthcare maintenance including cholesterol, diabetes screening discussed managed by PCP  5) Return in about 1 week (around 04/11/2020) for 1-2 week TVUS and follow up.   Malachy Mood, MD, [facility], [facility] 04/04/2020, 4:15 PM

## 2020-04-09 ENCOUNTER — Other Ambulatory Visit: Payer: Self-pay | Admitting: Obstetrics and Gynecology

## 2020-06-05 DIAGNOSIS — E876 Hypokalemia: Secondary | ICD-10-CM | POA: Insufficient documentation

## 2020-06-05 DIAGNOSIS — Z0000 Encounter for general adult medical examination without abnormal findings: Secondary | ICD-10-CM | POA: Insufficient documentation

## 2020-06-05 DIAGNOSIS — G4733 Obstructive sleep apnea (adult) (pediatric): Secondary | ICD-10-CM | POA: Insufficient documentation
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
## 2020-06-11 ENCOUNTER — Telehealth: Payer: Self-pay

## 2020-06-11 NOTE — Telephone Encounter (Signed)
Called pt to schedule gyn [place] and follow up appt w/ Staebler. She is scheduled for 4/7 @ 4:00 and with AMS at 4:30.

## 2020-06-13 ENCOUNTER — Ambulatory Visit (INDEPENDENT_AMBULATORY_CARE_PROVIDER_SITE_OTHER): Payer: Medicaid Other | Admitting: Obstetrics and Gynecology

## 2020-06-13 ENCOUNTER — Other Ambulatory Visit: Payer: Self-pay

## 2020-06-13 ENCOUNTER — Encounter: Payer: Self-pay | Admitting: Obstetrics and Gynecology

## 2020-06-13 ENCOUNTER — Ambulatory Visit (INDEPENDENT_AMBULATORY_CARE_PROVIDER_SITE_OTHER): Payer: Medicaid Other

## 2020-06-13 VITALS — BP 120/72 | Wt 189.0 lb

## 2020-06-13 DIAGNOSIS — R102 Pelvic and perineal pain: Secondary | ICD-10-CM

## 2020-06-13 DIAGNOSIS — R140 Abdominal distension (gaseous): Secondary | ICD-10-CM

## 2020-06-13 DIAGNOSIS — N939 Abnormal uterine and vaginal bleeding, unspecified: Secondary | ICD-10-CM

## 2020-06-13 DIAGNOSIS — R1032 Left lower quadrant pain: Secondary | ICD-10-CM

## 2020-06-13 NOTE — Progress Notes (Signed)
Gynecology Ultrasound Follow Up  Chief Complaint:  Chief Complaint  Patient presents with  . Follow-up    F/U US - RM 4     History of Present Illness: Patient is a 35 y.o. female who presents today for ultrasound evaluation of pelvic pain.  Ultrasound demonstrates the following findgins Adnexa: no masses seen, both ovaries appear polycytic Uterus: Non-enlarged, no evidence of fibroid, with endometrial stripe 74mm without focal abnormaliteis Additional: no free fluid  Review of Systems: Review of Systems  Gastrointestinal: Negative.   Genitourinary: Negative.     Past Medical History:  Past Medical History:  Diagnosis Date  . Anxiety   . GERD (gastroesophageal reflux disease)   . Hypertension    NO MEDS-NEVER FOLLOWED BACK UP WITH PCP  . S/P LEEP of cervix     Past Surgical History:  Past Surgical History:  Procedure Laterality Date  . BREAST LUMPECTOMY Left 07/01/2015   Procedure: BREAST LUMPECTOMY;  Surgeon: Christene Lye, MD;  Location: [facility] ORS;  Service: General;  Laterality: Left;  . CESAREAN SECTION  05/11/2011   fetal intolerance  . CESAREAN SECTION N/A 10/20/2018   Procedure: CESAREAN SECTION;  Surgeon: Malachy Mood, MD;  Location: [facility] ORS;  Service: Obstetrics;  Laterality: N/A;  . FOOT SURGERY      Gynecologic History:  No LMP recorded. Patient has had an injection. Contraception: Depo-Provera injections  Family History:  Family History  Problem Relation Age of Onset  . Thyroid cancer Maternal Grandmother   . Hypertension Mother   . Hypertension Father     Social History:  Social History   Socioeconomic History  . Marital status: Single    Spouse name: Not on file  . Number of children: Not on file  . Years of education: Not on file  . Highest education level: Not on file  Occupational History  . Not on file  Tobacco Use  . Smoking status: Current Every Day Smoker    Packs/day: 0.25    Years: 13.00    Pack years: 3.25     Types: Cigarettes  . Smokeless tobacco: Never Used  Vaping Use  . Vaping Use: Never used  Substance and Sexual Activity  . Alcohol use: Not Currently    Alcohol/week: 0.0 standard drinks    Comment: occ  . Drug use: No  . Sexual activity: Yes    Birth control/protection: Injection  Other Topics Concern  . Not on file  Social History Narrative  . Not on file   Social Determinants of Health   Financial Resource Strain: Not on file  Food Insecurity: Not on file  Transportation Needs: Not on file  Physical Activity: Not on file  Stress: Not on file  Social Connections: Not on file  Intimate Partner Violence: Not on file    Allergies:  Allergies  Allergen Reactions  . Erythromycin Base Other (See Comments)    Stomach cramps  . Adhesive [Tape] Other (See Comments)    Skin becomes raw  . Nickel Rash    Medications: Prior to Admission medications   Medication Sig Start Date End Date Taking? Authorizing Provider  amLODipine (NORVASC) 10 MG tablet Take by mouth. 07/25/19 07/24/20 Yes [provider]  escitalopram (LEXAPRO) 10 MG tablet TAKE 1 TABLET(10 MG) BY MOUTH DAILY 04/09/20  Yes Malachy Mood, MD  famotidine (PEPCID) 20 MG tablet Take 1 tablet (20 mg total) by mouth 2 (two) times daily. 07/07/18  Yes Rexene Agent, CNM  metFORMIN ([place])  500 MG 24 hr tablet Take by mouth. 06/05/20 06/05/21 Yes [provider]  potassium chloride SA (KLOR-CON) 20 MEQ tablet Take 20 mEq by mouth daily. 06/05/20  Yes [provider]  brompheniramine-pseudoephedrine-DM 30-2-10 MG/5ML syrup Take 5 mLs by mouth 4 (four) times daily as needed. Patient not taking: Reported on 04/04/2020 03/12/20   Sable Feil, PA-C  chlorthalidone (HYGROTON) 25 MG tablet Take by mouth. 06/02/19 06/01/20  [provider]  ibuprofen (ADVIL) 600 MG tablet Take 1 tablet (600 mg total) by mouth every 8 (eight) hours as needed. 03/12/20   Sable Feil, PA-C  labetalol  (NORMODYNE) 300 MG tablet Take 1 tablet (300 mg total) by mouth 2 (two) times daily. Patient not taking: Reported on 06/13/2020 12/16/18   Malachy Mood, MD  losartan (COZAAR) 100 MG tablet Take 1 tablet by mouth daily. 04/24/20   [provider]  medroxyPROGESTERone (DEPO-PROVERA) 150 MG/ML injection Inject 1 mL (150 mg total) into the muscle every 3 (three) months. 04/04/20 07/03/20  Malachy Mood, MD  ondansetron (ZOFRAN ODT) 4 MG disintegrating tablet Take 1 tablet (4 mg total) by mouth every 8 (eight) hours as needed for nausea or vomiting. Patient not taking: Reported on 04/04/2020 02/03/19   Charlann Lange, PA-C  Prenat-FeFmCb-DSS-FA-DHA w/o A (CITRANATAL HARMONY) 27-1-260 MG CAPS Take 1 tablet by mouth daily. Patient not taking: Reported on 02/03/2019 03/30/18   Gae Dry, MD  sucralfate (CARAFATE) 1 g tablet Take 1 tablet (1 g total) by mouth 4 (four) times daily. Patient not taking: Reported on 02/03/2019 10/04/18 10/04/19  Homero Fellers, MD  traZODone (DESYREL) 50 MG tablet Take 1 tablet (50 mg total) by mouth at bedtime as needed for sleep. 09/04/18   Malachy Mood, MD    Physical Exam Vitals: Blood pressure 120/72, weight 189 lb (85.7 kg), not currently breastfeeding.  General: NAD HEENT: normocephalic, anicteric Pulmonary: No increased work of breathing Extremities: no edema, erythema, or tenderness Neurologic: Grossly intact, normal gait Psychiatric: mood appropriate, affect full  US PELVIS TRANSVAGINAL NON-OB (TV ONLY)  Result Date: 06/13/2020 Patient Name: Carol Small DOB: 12/26/1985 MRN: [id number] ULTRASOUND REPORT Location: [facility] Date of Service: 06/13/2020 Indications:Pelvic Pain Findings: The uterus is anteverted and measures 9.0 x 3.8 x 4.2cm. Echo texture is homogenous without evidence of focal masses. The Endometrium measures 5.4 mm. Right Ovary measures 3.0 x 2.2 x 2.3 cm with ultrasound appearance of PCOS. Left Ovary measures  2.9 x 2.3 x 2.1 cm. Survey of the adnexa demonstrates no adnexal masses. There is no free fluid in the cul de sac. Impression: 1. Normal pelvic ultrasound. 2. Possible appearance of PCOS. Recommendations: 1.Clinical correlation with the patient's History and Physical Exam. Vita Barley, RT Images reviewed.  Normal GYN study without visualized pathology other than ovaries consistent with PCOS.  Malachy Mood, MD, [facility], [facility]    Assessment: 35 y.o. [phone] follow up pelvic pain [place]  Plan: Problem List Items Addressed This Visit   None   Visit Diagnoses    Pelvic pain in female    -  Primary      1) Pelvic pain - normal TVUS today.  Also had recent MRI to work up for possible pheochromocytoma which was negative.  2) Contraception - interested in IUD.  Patient plans IUD, and will schedule soon.  Risks and benefits of IUD discussed including the risks of irregular bleeding, cramping, infection, malpositioning, expulsion or uterine perforation of the  IUD (1:1000 placements)  which may require further procedures such as laparoscopy.  IUDs while effective at preventing pregnancy do not prevent transmission of sexually transmitted diseases and use of barrier methods for this purpose was discussed.  Low overall incidence of failure with 99.7% efficacy rate in typical use.  The patient has not contraindication to IUD placement.  3) A total of 20 minutes were spent in face-to-face contact with the patient during this encounter with over half of that time devoted to counseling and coordination of care.  4) Return in about 2 weeks (around 06/27/2020) for Mirena IUD insertion.    Malachy Mood, MD, [facility], [facility] 06/13/2020, 5:14 PM

## 2020-07-04 ENCOUNTER — Ambulatory Visit: Payer: Medicaid Other

## 2020-07-08 ENCOUNTER — Ambulatory Visit: Payer: Medicaid Other

## 2020-07-10 ENCOUNTER — Ambulatory Visit: Payer: Medicaid Other

## 2020-08-23 ENCOUNTER — Ambulatory Visit (INDEPENDENT_AMBULATORY_CARE_PROVIDER_SITE_OTHER): Payer: Medicaid Other

## 2020-08-23 ENCOUNTER — Telehealth: Payer: Self-pay

## 2020-08-23 ENCOUNTER — Other Ambulatory Visit: Payer: Self-pay

## 2020-08-23 DIAGNOSIS — Z3042 Encounter for surveillance of injectable contraceptive: Secondary | ICD-10-CM | POA: Diagnosis not present

## 2020-08-23 LAB — POCT URINE PREGNANCY: Preg Test, Ur: NEGATIVE

## 2020-08-23 MED ORDER — MEDROXYPROGESTERONE ACETATE 150 MG/ML IM SUSP
150.0000 mg | Freq: Once | INTRAMUSCULAR | Status: AC
  Administered 2020-08-23: 150 mg via INTRAMUSCULAR

## 2020-08-23 NOTE — Progress Notes (Signed)
Pt here for depo which was given IM right glut after neg UPT.  NDC# [phone]

## 2020-08-23 NOTE — Telephone Encounter (Signed)
Pt calling to be sure depo would be at the pharmacy.  [phone]  Called pharm; they said they had talked to the pt earlier and depo is ready for p/u;  pt aware and said she figured it out already.

## 2020-11-15 ENCOUNTER — Ambulatory Visit (INDEPENDENT_AMBULATORY_CARE_PROVIDER_SITE_OTHER): Payer: Medicaid Other

## 2020-11-15 ENCOUNTER — Other Ambulatory Visit: Payer: Self-pay

## 2020-11-15 DIAGNOSIS — Z3042 Encounter for surveillance of injectable contraceptive: Secondary | ICD-10-CM | POA: Diagnosis not present

## 2020-11-15 MED ORDER — MEDROXYPROGESTERONE ACETATE 150 MG/ML IM SUSP
150.0000 mg | Freq: Once | INTRAMUSCULAR | Status: AC
  Administered 2020-11-15: 150 mg via INTRAMUSCULAR

## 2020-11-15 NOTE — Progress Notes (Signed)
Pt here for depo which was given IM left deltoid. NDC# [id number]  Pt tolerated inj well.

## 2021-02-07 ENCOUNTER — Other Ambulatory Visit: Payer: Self-pay

## 2021-02-07 ENCOUNTER — Ambulatory Visit (INDEPENDENT_AMBULATORY_CARE_PROVIDER_SITE_OTHER): Payer: Medicaid Other

## 2021-02-07 DIAGNOSIS — Z30013 Encounter for initial prescription of injectable contraceptive: Secondary | ICD-10-CM | POA: Diagnosis not present

## 2021-02-07 MED ORDER — MEDROXYPROGESTERONE ACETATE 150 MG/ML IM SUSP
150.0000 mg | Freq: Once | INTRAMUSCULAR | Status: AC
  Administered 2021-02-07: 150 mg via INTRAMUSCULAR

## 2021-02-07 NOTE — Progress Notes (Signed)
Patient presents today for Depo Provera injection within dates. Given IM LD. Patient tolerated well.

## 2021-04-28 ENCOUNTER — Other Ambulatory Visit: Payer: Self-pay

## 2021-04-28 ENCOUNTER — Ambulatory Visit (INDEPENDENT_AMBULATORY_CARE_PROVIDER_SITE_OTHER): Payer: Medicaid Other

## 2021-04-28 ENCOUNTER — Ambulatory Visit: Payer: Medicaid Other

## 2021-04-28 DIAGNOSIS — Z3042 Encounter for surveillance of injectable contraceptive: Secondary | ICD-10-CM | POA: Diagnosis not present

## 2021-04-28 MED ORDER — MEDROXYPROGESTERONE ACETATE 150 MG/ML IM SUSP
150.0000 mg | Freq: Once | INTRAMUSCULAR | Status: AC
  Administered 2021-04-28: 150 mg via INTRAMUSCULAR

## 2021-05-06 DIAGNOSIS — G4709 Other insomnia: Secondary | ICD-10-CM | POA: Insufficient documentation

## 2021-06-04 NOTE — Progress Notes (Deleted)
? ?PCP:  Perrin Maltese, MD ? ? ?No chief complaint on file. ? ? ? ?HPI: ?     Ms. Carol Small is a 36 y.o. [id number] whose LMP was No LMP recorded. Patient has had an injection., presents today for her annual examination.  Her menses are absent due to depo.  Dysmenorrhea {dysmen:716}. She {does:18564} have intermenstrual bleeding. ? ?Sex activity: single partner, contraception - Depo-Provera injections.  ?Last Pap: 03/23/18 Results were: no abnormalities /neg HPV DNA  ?Hx of STDs: {STD hx:14358} ? ?Last mammogram: {ZHYQ:[id number]}  Results were: {norm/abn:13465} ?There is no FH of breast cancer. There is no FH of ovarian cancer. The patient {does:18564} do self-breast exams. ? ?Tobacco use: {tob:20664} ?Alcohol use: {Alcohol:11675} ?No drug use.  ?Exercise: {exercise:31265} ? ?She {does:18564} get adequate calcium and Vitamin D in her diet. ? ?Patient Active Problem List  ? Diagnosis Date Noted  ? Uterine scar from previous cesarean delivery affecting pregnancy 10/20/2018  ? S/P cesarean section 10/20/2018  ? Indication for care in labor and delivery, antepartum 10/12/2018  ? Polyhydramnios affecting pregnancy in third trimester 10/04/2018  ? Abdominal pain during pregnancy in third trimester 09/25/2018  ? Polyhydramnios in third trimester 09/23/2018  ? History of cesarean section complicating pregnancy 95/28/4132  ? Right upper quadrant abdominal pain affecting pregnancy in third trimester 09/04/2018  ? Anxiety   ? Chronic hypertension affecting pregnancy 06/09/2018  ? Tobacco use affecting pregnancy, antepartum 05/10/2018  ? History of pre-eclampsia in prior pregnancy, currently pregnant 05/10/2018  ? Supervision of high risk pregnancy, antepartum 03/23/2018  ? Obesity affecting pregnancy, antepartum 03/23/2018  ? Hypertension 02/18/2018  ? Major depressive disorder 02/18/2018  ? ? ?Past Surgical History:  ?Procedure Laterality Date  ? BREAST LUMPECTOMY Left 07/01/2015  ? Procedure: BREAST LUMPECTOMY;   Surgeon: Christene Lye, MD;  Location: [facility];  Service: General;  Laterality: Left;  ? CESAREAN SECTION  05/11/2011  ? fetal intolerance  ? CESAREAN SECTION N/A 10/20/2018  ? Procedure: CESAREAN SECTION;  Surgeon: Malachy Mood, MD;  Location: [facility];  Service: Obstetrics;  Laterality: N/A;  ? FOOT SURGERY    ? ? ?Family History  ?Problem Relation Age of Onset  ? Thyroid cancer Maternal Grandmother   ? Hypertension Mother   ? Hypertension Father   ? ? ?Social History  ? ?Socioeconomic History  ? Marital status: Single  ?  Spouse name: Not on file  ? Number of children: Not on file  ? Years of education: Not on file  ? Highest education level: Not on file  ?Occupational History  ? Not on file  ?Tobacco Use  ? Smoking status: Every Day  ?  Packs/day: 0.25  ?  Years: 13.00  ?  Pack years: 3.25  ?  Types: Cigarettes  ? Smokeless tobacco: Never  ?Vaping Use  ? Vaping Use: Never used  ?Substance and Sexual Activity  ? Alcohol use: Not Currently  ?  Alcohol/week: 0.0 standard drinks  ?  Comment: occ  ? Drug use: No  ? Sexual activity: Yes  ?  Birth control/protection: Injection  ?Other Topics Concern  ? Not on file  ?Social History Narrative  ? Not on file  ? ?Social Determinants of Health  ? ?Financial Resource Strain: Not on file  ?Food Insecurity: Not on file  ?Transportation Needs: Not on file  ?Physical Activity: Not on file  ?Stress: Not on file  ?Social Connections: Not on file  ?Intimate Partner Violence: Not on file  ? ? ? ?  Current Outpatient Medications:  ?  amLODipine (NORVASC) 10 MG tablet, Take by mouth., Disp: , Rfl:  ?  brompheniramine-pseudoephedrine-DM 30-2-10 MG/5ML syrup, Take 5 mLs by mouth 4 (four) times daily as needed. (Patient not taking: Reported on 04/04/2020), Disp: 120 mL, Rfl: 0 ?  chlorthalidone (HYGROTON) 25 MG tablet, Take by mouth., Disp: , Rfl:  ?  escitalopram (LEXAPRO) 10 MG tablet, TAKE 1 TABLET(10 MG) BY MOUTH DAILY, Disp: 30 tablet, Rfl: 1 ?  famotidine (PEPCID) 20 MG  tablet, Take 1 tablet (20 mg total) by mouth 2 (two) times daily., Disp: 60 tablet, Rfl: 3 ?  ibuprofen (ADVIL) 600 MG tablet, Take 1 tablet (600 mg total) by mouth every 8 (eight) hours as needed., Disp: 15 tablet, Rfl: 0 ?  labetalol (NORMODYNE) 300 MG tablet, Take 1 tablet (300 mg total) by mouth 2 (two) times daily. (Patient not taking: Reported on 06/13/2020), Disp: 60 tablet, Rfl: 11 ?  losartan (COZAAR) 100 MG tablet, Take 1 tablet by mouth daily., Disp: , Rfl:  ?  medroxyPROGESTERone (DEPO-PROVERA) 150 MG/ML injection, Inject 1 mL (150 mg total) into the muscle every 3 (three) months., Disp: 1 mL, Rfl: 3 ?  metFORMIN (GLUCOPHAGE-XR) 500 MG 24 hr tablet, Take by mouth., Disp: , Rfl:  ?  ondansetron (ZOFRAN ODT) 4 MG disintegrating tablet, Take 1 tablet (4 mg total) by mouth every 8 (eight) hours as needed for nausea or vomiting. (Patient not taking: Reported on 04/04/2020), Disp: 12 tablet, Rfl: 0 ?  potassium chloride SA (KLOR-CON) 20 MEQ tablet, Take 20 mEq by mouth daily., Disp: , Rfl:  ?  Prenat-FeFmCb-DSS-FA-DHA w/o A (CITRANATAL HARMONY) 27-1-260 MG CAPS, Take 1 tablet by mouth daily. (Patient not taking: Reported on 02/03/2019), Disp: 30 capsule, Rfl: 11 ?  sucralfate (CARAFATE) 1 g tablet, Take 1 tablet (1 g total) by mouth 4 (four) times daily. (Patient not taking: Reported on 02/03/2019), Disp: 120 tablet, Rfl: 5 ?  traZODone (DESYREL) 50 MG tablet, Take 1 tablet (50 mg total) by mouth at bedtime as needed for sleep., Disp: 30 tablet, Rfl: 1 ? ?Current Facility-Administered Medications:  ?  medroxyPROGESTERone (DEPO-PROVERA) injection 150 mg, 150 mg, Intramuscular, Once, Will Bonnet, MD ? ? ? ? ?ROS: ? ?Review of Systems ?BREAST: No symptoms ? ? ?Objective: ?There were no vitals taken for this visit. ? ? ?OBGyn Exam ? ?Results: ?No results found for this or any previous visit (from the past 24 hour(s)). ? ?Assessment/Plan: ?No diagnosis found. ? ?No orders of the defined types were placed in  this encounter. ? ?          ?GYN counsel {counseling: 16159} ? ? ?  F/U ? No follow-ups on file. ? ?Ilah Boule B. Edwyna Dangerfield, PA-C ?06/04/2021 ?9:22 AM ?

## 2021-06-05 ENCOUNTER — Ambulatory Visit: Payer: Medicaid Other | Admitting: Obstetrics and Gynecology

## 2021-06-05 DIAGNOSIS — Z3042 Encounter for surveillance of injectable contraceptive: Secondary | ICD-10-CM

## 2021-06-05 DIAGNOSIS — Z01419 Encounter for gynecological examination (general) (routine) without abnormal findings: Secondary | ICD-10-CM

## 2021-08-06 DIAGNOSIS — K219 Gastro-esophageal reflux disease without esophagitis: Secondary | ICD-10-CM | POA: Insufficient documentation

## 2021-08-06 DIAGNOSIS — Z9889 Other specified postprocedural states: Secondary | ICD-10-CM | POA: Insufficient documentation

## 2021-08-06 DIAGNOSIS — Z789 Other specified health status: Secondary | ICD-10-CM | POA: Insufficient documentation

## 2021-08-06 DIAGNOSIS — F17200 Nicotine dependence, unspecified, uncomplicated: Secondary | ICD-10-CM | POA: Insufficient documentation

## 2021-10-29 ENCOUNTER — Ambulatory Visit
Admission: RE | Admit: 2021-10-29 | Discharge: 2021-10-29 | Disposition: A | Discharge status: Home / Self Care | Payer: Medicaid Other | Source: Ambulatory Visit | Attending: Emergency Medicine | Admitting: Emergency Medicine

## 2021-10-29 VITALS — BP 156/108 | HR 105 | Temp 97.5°F | Resp 18

## 2021-10-29 DIAGNOSIS — A084 Viral intestinal infection, unspecified: Secondary | ICD-10-CM | POA: Diagnosis not present

## 2021-10-29 LAB — POCT URINALYSIS DIP (MANUAL ENTRY)
Bilirubin, UA: NEGATIVE
Glucose, UA: NEGATIVE mg/dL
Ketones, POC UA: NEGATIVE mg/dL
Leukocytes, UA: NEGATIVE
Nitrite, UA: NEGATIVE
Protein Ur, POC: NEGATIVE mg/dL
Spec Grav, UA: 1.025 (ref 1.010–1.025)
Urobilinogen, UA: 0.2 E.U./dL
pH, UA: 6 (ref 5.0–8.0)

## 2021-10-29 LAB — POCT URINE PREGNANCY: Preg Test, Ur: NEGATIVE

## 2021-10-29 MED ORDER — LOPERAMIDE HCL 2 MG PO CAPS: 2.0000 mg | ORAL_CAPSULE | Freq: Four times a day (QID) | ORAL | 0 refills | Status: AC | PRN

## 2021-10-29 MED ORDER — ONDANSETRON 4 MG PO TBDP: 4.0000 mg | ORAL_TABLET | Freq: Three times a day (TID) | ORAL | 0 refills | Status: DC | PRN

## 2021-10-29 NOTE — Discharge Instructions (Addendum)
Your symptoms are most likely caused by a virus, it will work its way out your system over the next few days  Alysis is negative for bladder infection, urine pregnancy test is negative  You can use zofran every 8 hours as needed for nausea, be mindful this medication may make you drowsy, take the first dose at home to see how it affects your body  You can use Imodium to help with diarrhea, and be mindful over use of this medication may cause opposite effect constipation  You can use over-the-counter ibuprofen or Tylenol, which ever you have at home, to help manage fevers  Continue to promote hydration throughout the day by using electrolyte replacement solution such as Gatorade, body armor, Pedialyte, which ever you have at home  Try eating bland foods such as bread, rice, toast, fruit which are easier on the stomach to digest, avoid foods that are overly spicy, overly seasoned or greasy

## 2021-10-29 NOTE — ED Triage Notes (Signed)
Pt states she has had intermittent nausea since yesterday , denies pain

## 2021-10-29 NOTE — ED Provider Notes (Signed)
[facility]    CSN: [id number] Arrival date & time: 10/29/21  1457      History   Chief Complaint Chief Complaint  Patient presents with   Nausea    I get hot then cold. Just keep feeling sick one minute I'm ok then next i want to throw up everywhere - Entered by patient    HPI Carol Small is a 36 y.o. female.   Patient presents with nausea, vomiting and diarrhea beginning 1 day ago.  Possible sick contact as a neighbor had nausea the day prior to symptoms beginning.  Last occurrence of vomiting this morning, has a occurred approximately 2 times.  Last episode of diarrhea today, described as soft and watery, has occurred once decreased appetite but tolerating some food and liquids.  Has attempted use of Pepto-Bismol which has been ineffective.  Last menstrual period August 14 but endorses that only lasted for 1 day which is not typical presentation.  Denies urinary or vaginal symptoms.  Past Medical History:  Diagnosis Date   Anxiety    GERD (gastroesophageal reflux disease)    Hypertension    NO MEDS-NEVER FOLLOWED BACK UP WITH PCP   S/P LEEP of cervix     Patient Active Problem List   Diagnosis Date Noted   Uterine scar from previous cesarean delivery affecting pregnancy 10/20/2018   S/P cesarean section 10/20/2018   Indication for care in labor and delivery, antepartum 10/12/2018   Polyhydramnios affecting pregnancy in third trimester 10/04/2018   Abdominal pain during pregnancy in third trimester 09/25/2018   Polyhydramnios in third trimester 09/23/2018   History of cesarean section complicating pregnancy 84/13/2440   Right upper quadrant abdominal pain affecting pregnancy in third trimester 09/04/2018   Anxiety    Chronic hypertension affecting pregnancy 06/09/2018   Tobacco use affecting pregnancy, antepartum 05/10/2018   History of pre-eclampsia in prior pregnancy, currently pregnant 05/10/2018   Supervision of high risk pregnancy, antepartum  03/23/2018   Obesity affecting pregnancy, antepartum 03/23/2018   Hypertension 02/18/2018   Major depressive disorder 02/18/2018    Past Surgical History:  Procedure Laterality Date   BREAST LUMPECTOMY Left 07/01/2015   Procedure: BREAST LUMPECTOMY;  Surgeon: Christene Lye, MD;  Location: [facility];  Service: General;  Laterality: Left;   CESAREAN SECTION  05/11/2011   fetal intolerance   CESAREAN SECTION N/A 10/20/2018   Procedure: CESAREAN SECTION;  Surgeon: Malachy Mood, MD;  Location: [facility];  Service: Obstetrics;  Laterality: N/A;   FOOT SURGERY      OB History     Gravida  3   Para  2   Term  2   Preterm      AB  1   Living  2      SAB  1   IAB      Ectopic      Multiple  0   Live Births  2            Home Medications    Prior to Admission medications   Medication Sig Start Date End Date Taking? Authorizing Provider  loperamide (IMODIUM) 2 MG capsule Take 1 capsule (2 mg total) by mouth 4 (four) times daily as needed for diarrhea or loose stools. 10/29/21  Yes Jomar Denz R, NP  ondansetron (ZOFRAN-ODT) 4 MG disintegrating tablet Take 1 tablet (4 mg total) by mouth every 8 (eight) hours as needed for nausea or vomiting. 10/29/21  Yes Hans Eden, NP  amLODipine (NORVASC) 10 MG tablet Take by mouth. 07/25/19 07/24/20  [provider]  brompheniramine-pseudoephedrine-DM 30-2-10 MG/5ML syrup Take 5 mLs by mouth 4 (four) times daily as needed. Patient not taking: Reported on 04/04/2020 03/12/20   Sable Feil, PA-C  chlorthalidone (HYGROTON) 25 MG tablet Take by mouth. 06/02/19 06/01/20  [provider]  escitalopram (LEXAPRO) 10 MG tablet TAKE 1 TABLET(10 MG) BY MOUTH DAILY 04/09/20   Malachy Mood, MD  famotidine (PEPCID) 20 MG tablet Take 1 tablet (20 mg total) by mouth 2 (two) times daily. 07/07/18   Rexene Agent, CNM  ibuprofen (ADVIL) 600 MG tablet Take 1 tablet (600 mg total) by mouth every 8 (eight) hours  as needed. 03/12/20   Sable Feil, PA-C  labetalol (NORMODYNE) 300 MG tablet Take 1 tablet (300 mg total) by mouth 2 (two) times daily. Patient not taking: Reported on 06/13/2020 12/16/18   Malachy Mood, MD  losartan (COZAAR) 100 MG tablet Take 1 tablet by mouth daily. 04/24/20   [provider]  medroxyPROGESTERone (DEPO-PROVERA) 150 MG/ML injection Inject 1 mL (150 mg total) into the muscle every 3 (three) months. 04/04/20 07/03/20  Malachy Mood, MD  metFORMIN (GLUCOPHAGE-XR) 500 MG 24 hr tablet Take by mouth. 06/05/20 06/05/21  [provider]  potassium chloride SA (KLOR-CON) 20 MEQ tablet Take 20 mEq by mouth daily. 06/05/20   [provider]  Prenat-FeFmCb-DSS-FA-DHA w/o A (CITRANATAL HARMONY) 27-1-260 MG CAPS Take 1 tablet by mouth daily. Patient not taking: Reported on 02/03/2019 03/30/18   Gae Dry, MD  sucralfate (CARAFATE) 1 g tablet Take 1 tablet (1 g total) by mouth 4 (four) times daily. Patient not taking: Reported on 02/03/2019 10/04/18 10/04/19  Homero Fellers, MD  traZODone (DESYREL) 50 MG tablet Take 1 tablet (50 mg total) by mouth at bedtime as needed for sleep. 09/04/18   Malachy Mood, MD    Family History Family History  Problem Relation Age of Onset   Thyroid cancer Maternal Grandmother    Hypertension Mother    Hypertension Father     Social History Social History   Tobacco Use   Smoking status: Every Day    Packs/day: 0.25    Years: 13.00    Total pack years: 3.25    Types: Cigarettes   Smokeless tobacco: Never  Vaping Use   Vaping Use: Never used  Substance Use Topics   Alcohol use: Not Currently    Alcohol/week: 0.0 standard drinks of alcohol    Comment: occ   Drug use: No     Allergies   Erythromycin base, Adhesive [tape], and Nickel   Review of Systems Review of Systems  Constitutional: Negative.   HENT: Negative.    Respiratory: Negative.    Gastrointestinal:  Positive for diarrhea, nausea  and vomiting. Negative for abdominal distention, abdominal pain, anal bleeding, blood in stool, constipation and rectal pain.  Skin: Negative.   Neurological: Negative.      Physical Exam Triage Vital Signs ED Triage Vitals  Enc Vitals Group     BP 10/29/21 1508 (!) 156/108     Pulse Rate 10/29/21 1508 (!) 105     Resp 10/29/21 1508 18     Temp 10/29/21 1508 (!) 97.5 F (36.4 C)     Temp src --      SpO2 10/29/21 1508 98 %     Weight --      Height --      Head Circumference --  Peak Flow --      Pain Score 10/29/21 1506 0     Pain Loc --      Pain Edu? --      Excl. in [place]? --    No data found.  Updated Vital Signs BP (!) 156/108   Pulse (!) 105   Temp (!) 97.5 F (36.4 C)   Resp 18   LMP 10/20/2021   SpO2 98%   Visual Acuity Right Eye Distance:   Left Eye Distance:   Bilateral Distance:    Right Eye Near:   Left Eye Near:    Bilateral Near:     Physical Exam Constitutional:      Appearance: Normal appearance.  Eyes:     Extraocular Movements: Extraocular movements intact.  Pulmonary:     Effort: Pulmonary effort is normal.  Abdominal:     General: Abdomen is flat. Bowel sounds are normal. There is no distension.     Palpations: Abdomen is soft.     Tenderness: There is no abdominal tenderness. There is no guarding.  Neurological:     Mental Status: She is alert and oriented to person, place, and time. Mental status is at baseline.  Psychiatric:        Mood and Affect: Mood normal.        Behavior: Behavior normal.      UC Treatments / Results  Labs (all labs ordered are listed, but only abnormal results are displayed) Labs Reviewed  POCT URINALYSIS DIP (MANUAL ENTRY) - Abnormal; Notable for the following components:      Result Value   Blood, UA moderate (*)    All other components within normal limits  POCT URINE PREGNANCY    EKG   Radiology No results found.  Procedures Procedures (including critical care time)  Medications  Ordered in UC Medications - No data to display  Initial Impression / Assessment and Plan / UC Course  I have reviewed the triage vital signs and the nursing notes.  Pertinent labs & imaging results that were available during my care of the patient were reviewed by me and considered in my medical decision making (see chart for details).  Viral gastroenteritis  Urine pregnancy negative, urinalysis negative, discussed findings with patient, no tenderness is noted to the abdominal exam and patient is in no signs of distress nor ill-appearing therefore low suspicion for an acute abdomen, etiology is most likely viral, prescribe Zofran and Imodium for outpatient use, advised increase fluid intake to maintain hydration and a bland diet for additional supportive measures, may follow-up with urgent care as needed symptoms persist or worsen Final Clinical Impressions(s) / UC Diagnoses   Final diagnoses:  Viral gastroenteritis     Discharge Instructions      Your symptoms are most likely caused by a virus, it will work its way out your system over the next few days  Alysis is negative for bladder infection, urine pregnancy test is negative  You can use zofran every 8 hours as needed for nausea, be mindful this medication may make you drowsy, take the first dose at home to see how it affects your body  You can use Imodium to help with diarrhea, and be mindful over use of this medication may cause opposite effect constipation  You can use over-the-counter ibuprofen or Tylenol, which ever you have at home, to help manage fevers  Continue to promote hydration throughout the day by using electrolyte replacement solution such as Gatorade, body armor,  Pedialyte, which ever you have at home  Try eating bland foods such as bread, rice, toast, fruit which are easier on the stomach to digest, avoid foods that are overly spicy, overly seasoned or greasy    ED Prescriptions     Medication Sig Dispense  Auth. Provider   ondansetron (ZOFRAN-ODT) 4 MG disintegrating tablet Take 1 tablet (4 mg total) by mouth every 8 (eight) hours as needed for nausea or vomiting. 20 tablet Teagen Bucio, Vincente Liberty R, NP   loperamide (IMODIUM) 2 MG capsule Take 1 capsule (2 mg total) by mouth 4 (four) times daily as needed for diarrhea or loose stools. 12 capsule Hans Eden, NP      PDMP not reviewed this encounter.   Hans Eden, NP 10/29/21 1544

## 2021-11-26 ENCOUNTER — Ambulatory Visit
Admission: RE | Admit: 2021-11-26 | Discharge: 2021-11-26 | Disposition: A | Discharge status: Home / Self Care | Payer: Medicaid Other | Source: Ambulatory Visit

## 2021-11-26 VITALS — BP 162/119 | HR 95 | Temp 97.9°F | Resp 16

## 2021-11-26 DIAGNOSIS — L03319 Cellulitis of trunk, unspecified: Secondary | ICD-10-CM

## 2021-11-26 DIAGNOSIS — L02219 Cutaneous abscess of trunk, unspecified: Secondary | ICD-10-CM | POA: Diagnosis not present

## 2021-11-26 MED ORDER — SULFAMETHOXAZOLE-TRIMETHOPRIM 800-160 MG PO TABS: 1.0000 | ORAL_TABLET | Freq: Two times a day (BID) | ORAL | 0 refills | Status: AC

## 2021-11-26 NOTE — ED Triage Notes (Signed)
Pt. States that she was bit by a spider on Saturday and noticed a painful bump appear on the back of her right shoulder. She  was diagnosed and [facility] at [facility] and took one pill of her antibiotics and stopped treatment d/t medication precautions. Pt. Has also been self treating her bite with rubbing alcohol and peroxide.

## 2021-11-26 NOTE — Discharge Instructions (Addendum)
Follow-up here or with your primary care provider if your symptoms do not resolve with the antibiotic.  Change dressing at least daily.  Recommend twice daily application of moist towel with Epsom salt.  Schedule visit with dermatology to have entire cyst removed.

## 2021-11-26 NOTE — ED Provider Notes (Signed)
Roderic Palau    CSN: [id number] Arrival date & time: 11/26/21  1406      History   Chief Complaint Chief Complaint  Patient presents with   Insect Bite    Entered by patient    HPI Carol Small is a 36 y.o. female.   HPI  Patient presents with complaint of a painful bump on her back beneath her bra.  She believes she was bit by an insect.  States that she walked through a biters web on Saturday and awoke Sunday with this lesion.  Presented to urgent care and was given an antibiotic, doxycycline, took 1 dose.  She states she stopped taking the medication because she was told she had to "stay out of the sun "while taking this medication.  She is upset because other provider "did not clean it ", "did not drain it".  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  She states she has been self treating the lesion with rubbing alcohol and hydrogen peroxide.  Past Medical History:  Diagnosis Date   Anxiety    GERD (gastroesophageal reflux disease)    Hypertension    NO MEDS-NEVER FOLLOWED BACK UP WITH PCP   S/P LEEP of cervix     Patient Active Problem List   Diagnosis Date Noted   Alcohol use 08/06/2021   Current smoker 08/06/2021   GERD (gastroesophageal reflux disease) 08/06/2021   History of lumpectomy of left breast 08/06/2021   Other insomnia 05/06/2021   Routine health maintenance 06/05/2020   Hypokalemia 06/05/2020   Mild obstructive sleep apnea 06/05/2020   Asthma 07/25/2019   Benign paroxysmal positional vertigo 07/25/2019   Nonintractable headache 07/25/2019   Post concussion syndrome 07/25/2019   Uterine scar from previous cesarean delivery affecting pregnancy 10/20/2018   S/P cesarean section 10/20/2018   Indication for care in labor and delivery, antepartum 10/12/2018   Polyhydramnios affecting pregnancy in third trimester 10/04/2018   Abdominal pain during pregnancy in third trimester 09/25/2018    Polyhydramnios in third trimester 09/23/2018   History of cesarean section complicating pregnancy 99/35/7017   Right upper quadrant abdominal pain affecting pregnancy in third trimester 09/04/2018   Anxiety    Chronic hypertension affecting pregnancy 06/09/2018   Tobacco use affecting pregnancy, antepartum 05/10/2018   History of pre-eclampsia in prior pregnancy, currently pregnant 05/10/2018   Supervision of high risk pregnancy, antepartum 03/23/2018   Obesity affecting pregnancy, antepartum 03/23/2018   Hypertension 02/18/2018   Major depressive disorder 02/18/2018   Mood disorder ([place]) 02/18/2018    Past Surgical History:  Procedure Laterality Date   BREAST LUMPECTOMY Left 07/01/2015   Procedure: BREAST LUMPECTOMY;  Surgeon: Christene Lye, MD;  Location: [facility] ORS;  Service: General;  Laterality: Left;   CESAREAN SECTION  05/11/2011   fetal intolerance   CESAREAN SECTION N/A 10/20/2018   Procedure: CESAREAN SECTION;  Surgeon: Malachy Mood, MD;  Location: [facility] ORS;  Service: Obstetrics;  Laterality: N/A;   FOOT SURGERY      OB History     Gravida  3   Para  2   Term  2   Preterm      AB  1   Living  2      SAB  1   IAB      Ectopic      Multiple  0   Live Births  2  Home Medications    Prior to Admission medications   Medication Sig Start Date End Date Taking? Authorizing Provider  albuterol (VENTOLIN HFA) 108 (90 Base) MCG/ACT inhaler Inhale into the lungs. 08/06/21  Yes [provider]  amLODipine (NORVASC) 10 MG tablet Take by mouth. 07/25/19 08/06/22 Yes [provider]  famotidine (PEPCID) 20 MG tablet Take by mouth. 08/06/21  Yes [provider]  meclizine (ANTIVERT) 25 MG tablet Take by mouth. 06/05/20  Yes [provider]  topiramate (TOPAMAX) 25 MG tablet 2 tablets in the morning for 2 weeks then 2 tablet twice daily 05/06/21  Yes [provider]  amLODipine (NORVASC) 10 MG tablet  Take by mouth. 07/25/19 07/24/20  [provider]  brompheniramine-pseudoephedrine-DM 30-2-10 MG/5ML syrup Take 5 mLs by mouth 4 (four) times daily as needed. Patient not taking: Reported on 04/04/2020 03/12/20   Sable Feil, PA-C  chlorthalidone (HYGROTON) 25 MG tablet Take by mouth. 06/02/19 06/01/20  [provider]  doxycycline (VIBRAMYCIN) 100 MG capsule Take 100 mg by mouth 2 (two) times daily. 11/24/21   [provider]  escitalopram (LEXAPRO) 10 MG tablet TAKE 1 TABLET(10 MG) BY MOUTH DAILY 04/09/20   Malachy Mood, MD  famotidine (PEPCID) 20 MG tablet Take 1 tablet (20 mg total) by mouth 2 (two) times daily. 07/07/18   Rexene Agent, CNM  ibuprofen (ADVIL) 600 MG tablet Take 1 tablet (600 mg total) by mouth every 8 (eight) hours as needed. 03/12/20   Sable Feil, PA-C  labetalol (NORMODYNE) 300 MG tablet Take 1 tablet (300 mg total) by mouth 2 (two) times daily. Patient not taking: Reported on 06/13/2020 12/16/18   Malachy Mood, MD  loperamide (IMODIUM) 2 MG capsule Take 1 capsule (2 mg total) by mouth 4 (four) times daily as needed for diarrhea or loose stools. 10/29/21   White, Leitha Schuller, NP  losartan (COZAAR) 100 MG tablet Take 1 tablet by mouth daily. 04/24/20   [provider]  medroxyPROGESTERone (DEPO-PROVERA) 150 MG/ML injection Inject 1 mL (150 mg total) into the muscle every 3 (three) months. 04/04/20 07/03/20  Malachy Mood, MD  metFORMIN (GLUCOPHAGE-XR) 500 MG 24 hr tablet Take by mouth. 06/05/20 06/05/21  [provider]  ondansetron (ZOFRAN-ODT) 4 MG disintegrating tablet Take 1 tablet (4 mg total) by mouth every 8 (eight) hours as needed for nausea or vomiting. 10/29/21   Hans Eden, NP  potassium chloride SA (KLOR-CON) 20 MEQ tablet Take 20 mEq by mouth daily. 06/05/20   [provider]  Prenat-FeFmCb-DSS-FA-DHA w/o A (CITRANATAL HARMONY) 27-1-260 MG CAPS Take 1 tablet by mouth daily. Patient not taking: Reported  on 02/03/2019 03/30/18   Gae Dry, MD  sucralfate (CARAFATE) 1 g tablet Take 1 tablet (1 g total) by mouth 4 (four) times daily. Patient not taking: Reported on 02/03/2019 10/04/18 10/04/19  Homero Fellers, MD  traZODone (DESYREL) 50 MG tablet Take 1 tablet (50 mg total) by mouth at bedtime as needed for sleep. 09/04/18   Malachy Mood, MD    Family History Family History  Problem Relation Age of Onset   Thyroid cancer Maternal Grandmother    Hypertension Mother    Hypertension Father     Social History Social History   Tobacco Use   Smoking status: Every Day    Packs/day: 0.25    Years: 13.00    Total pack years: 3.25    Types: Cigarettes   Smokeless tobacco: Never  Vaping Use  Vaping Use: Never used  Substance Use Topics   Alcohol use: Not Currently    Alcohol/week: 0.0 standard drinks of alcohol    Comment: occ   Drug use: No     Allergies   Erythromycin base, Adhesive [tape], and Nickel   Review of Systems Review of Systems   Physical Exam Triage Vital Signs ED Triage Vitals [11/26/21 1430]  Enc Vitals Group     BP (!) 162/119     Pulse Rate 95     Resp 16     Temp 97.9 F (36.6 C)     Temp src      SpO2 98 %     Weight      Height      Head Circumference      Peak Flow      Pain Score 9     Pain Loc      Pain Edu?      Excl. in [place]?    No data found.  Updated Vital Signs BP (!) 162/119   Pulse 95   Temp 97.9 F (36.6 C)   Resp 16   LMP 10/20/2021   SpO2 98%   Visual Acuity Right Eye Distance:   Left Eye Distance:   Bilateral Distance:    Right Eye Near:   Left Eye Near:    Bilateral Near:     Physical Exam Constitutional:      Appearance: Normal appearance.  Skin:    General: Skin is warm and dry.       Neurological:     General: No focal deficit present.     Mental Status: She is alert and oriented to person, place, and time.  Psychiatric:        Mood and Affect: Mood normal.        Behavior: Behavior  normal.     UC Treatments / Results  Labs (all labs ordered are listed, but only abnormal results are displayed) Labs Reviewed - No data to display  EKG   Radiology No results found.  Procedures Incision and Drainage  Date/Time: 11/26/2021 2:42 PM  Performed by: Rose Phi, FNP Authorized by: Rose Phi, FNP   Consent:    Consent obtained:  Verbal   Consent given by:  Patient   Risks, benefits, and alternatives were discussed: yes     Risks discussed:  Incomplete drainage, pain and infection   Alternatives discussed:  No treatment Universal protocol:    Procedure explained and questions answered to patient or proxy's satisfaction: yes     Relevant documents present and verified: yes     Test results available : no     Imaging studies available: no     Required blood products, implants, devices, and special equipment available: no     Site/side marked: no     Immediately prior to procedure, a time out was called: yes     Patient identity confirmed:  Verbally with patient Location:    Type:  Abscess   Size:  3 cm annular   Location:  Trunk   Trunk location:  Back Pre-procedure details:    Skin preparation:  Povidone-iodine Anesthesia:    Anesthesia method:  Local infiltration   Local anesthetic:  Lidocaine 1% WITH epi Procedure type:    Complexity:  Simple Procedure details:    Ultrasound guidance: no     Needle aspiration: no     Incision types:  Single straight   Incision depth:  Dermal  Wound management:  Probed and deloculated   Drainage:  Purulent and bloody   Drainage amount:  Scant   Wound treatment:  Wound left open   Packing materials:  None Post-procedure details:    Procedure completion:  Tolerated  (including critical care time)  Medications Ordered in UC Medications - No data to display  Initial Impression / Assessment and Plan / UC Course  I have reviewed the triage vital signs and the nursing notes.  Pertinent labs &  imaging results that were available during my care of the patient were reviewed by me and considered in my medical decision making (see chart for details).   Suspect infected epidermal cyst versus insect bite.  There is significant subdermal cystic material.  Incised center of lesion and removed scant amount of purulent discharge. Incision left open. Applied antibiotic ointment and covered with nonadherent dressing.   Final Clinical Impressions(s) / UC Diagnoses   Final diagnoses:  None   Discharge Instructions   None    ED Prescriptions   None    PDMP not reviewed this encounter.   Rose Phi, [place] 11/26/21 1526

## 2022-03-06 ENCOUNTER — Emergency Department
Admission: EM | Admit: 2022-03-06 | Discharge: 2022-03-06 | Disposition: A | Discharge status: Home / Self Care | Payer: Medicaid Other | Attending: Emergency Medicine | Admitting: Emergency Medicine

## 2022-03-06 ENCOUNTER — Encounter: Payer: Self-pay | Admitting: Emergency Medicine

## 2022-03-06 ENCOUNTER — Other Ambulatory Visit: Payer: Self-pay

## 2022-03-06 ENCOUNTER — Emergency Department: Payer: Medicaid Other

## 2022-03-06 DIAGNOSIS — I10 Essential (primary) hypertension: Secondary | ICD-10-CM | POA: Diagnosis not present

## 2022-03-06 DIAGNOSIS — I4891 Unspecified atrial fibrillation: Secondary | ICD-10-CM | POA: Insufficient documentation

## 2022-03-06 DIAGNOSIS — R0602 Shortness of breath: Secondary | ICD-10-CM | POA: Diagnosis present

## 2022-03-06 LAB — CBC
HCT: 45.1 % (ref 36.0–46.0)
Hemoglobin: 14.9 g/dL (ref 12.0–15.0)
MCH: 29.8 pg (ref 26.0–34.0)
MCHC: 33 g/dL (ref 30.0–36.0)
MCV: 90.2 fL (ref 80.0–100.0)
Platelets: 366 10*3/uL (ref 150–400)
RBC: 5 MIL/uL (ref 3.87–5.11)
RDW: 13.8 % (ref 11.5–15.5)
WBC: 13.5 10*3/uL — ABNORMAL HIGH (ref 4.0–10.5)
nRBC: 0 % (ref 0.0–0.2)

## 2022-03-06 LAB — BASIC METABOLIC PANEL
Anion gap: 8 (ref 5–15)
BUN: 12 mg/dL (ref 6–20)
CO2: 18 mmol/L — ABNORMAL LOW (ref 22–32)
Calcium: 8.2 mg/dL — ABNORMAL LOW (ref 8.9–10.3)
Chloride: 115 mmol/L — ABNORMAL HIGH (ref 98–111)
Creatinine, Ser: 0.59 mg/dL (ref 0.44–1.00)
GFR, Estimated: >60 mL/min (ref >60)
Glucose, Bld: 106 mg/dL — ABNORMAL HIGH (ref 70–99)
Potassium: 3.9 mmol/L (ref 3.5–5.1)
Sodium: 141 mmol/L (ref 135–145)

## 2022-03-06 LAB — MAGNESIUM: Magnesium: 2.4 mg/dL (ref 1.7–2.4)

## 2022-03-06 LAB — TROPONIN I (HIGH SENSITIVITY): Troponin I (High Sensitivity): 4 ng/L (ref <18)

## 2022-03-06 LAB — T4, FREE: Free T4: 0.73 ng/dL (ref 0.61–1.12)

## 2022-03-06 LAB — TSH: TSH: 0.898 u[IU]/mL (ref 0.350–4.500)

## 2022-03-06 MED ORDER — METOPROLOL TARTRATE 25 MG PO TABS: 25.0000 mg | ORAL_TABLET | Freq: Two times a day (BID) | ORAL | 1 refills | Status: DC

## 2022-03-06 MED ORDER — METOPROLOL TARTRATE 25 MG PO TABS
25.0000 mg | ORAL_TABLET | Freq: Once | ORAL | Status: AC
  Administered 2022-03-06: 25 mg via ORAL
  Filled 2022-03-06: qty 1

## 2022-03-06 NOTE — ED Triage Notes (Signed)
Pt comes with c/o CP and sob that started today. Pt came via [facility] from home. Pt had onset of afib with RVR and HR was in 170-180. EMS gave 20 of cardizem. Pt now at 80-120. Pt was given zofran for nausea. Pt has 2og in hand.

## 2022-03-06 NOTE — ED Provider Notes (Signed)
[facility] Provider Note    Event Date/Time   First MD Initiated Contact with Patient 03/06/22 1318     (approximate)   History   Chief Complaint: Chest Pain   HPI  Carol Small is a 36 y.o. female with a past history of hypertension, GERD, anxiety, alcohol abuse who comes the ED complaining of chest pain and shortness of breath that started this morning while brushing her teeth.  Constant.  EMS found the patient to be in A-fib with a heart rate of 170, gave 20 mg of IV Cardizem.  Her heart rate was roughly rate controlled on arrival to the ED.  On my assessment, patient reports that symptoms have resolved.  Denies excessive caffeine, decongestants or other over-the-counter medicines recently, no recreational stimulant drugs.  No recent illness.  Has had a few episodes of heavy drinking recently including 4 days ago and last night having a few shots of liquor.     Physical Exam   Triage Vital Signs: ED Triage Vitals  Enc Vitals Group     BP 03/06/22 0934 (!) 113/95     Pulse Rate 03/06/22 0934 88     Resp 03/06/22 0934 18     Temp 03/06/22 0934 98.3 F (36.8 C)     Temp Source 03/06/22 0934 Oral     SpO2 03/06/22 0934 100 %     Weight 03/06/22 1328 188 lb 15 oz (85.7 kg)     Height 03/06/22 1328 '5\' 4"'$  (1.626 m)     Head Circumference --      Peak Flow --      Pain Score 03/06/22 0923 4     Pain Loc --      Pain Edu? --      Excl. in [place]? --     Most recent vital signs: Vitals:   03/06/22 0934 03/06/22 1330  BP: (!) 113/95 114/88  Pulse: 88 80  Resp: 18 18  Temp: 98.3 F (36.8 C) 98 F (36.7 C)  SpO2: 100% 100%    General: Awake, no distress.  CV:  Good peripheral perfusion.  Regular rate and rhythm, rate of 80 Resp:  Normal effort.  Clear to auscultation bilaterally Abd:  No distention.  Soft nontender Other:  No lower extremity edema   ED Results / Procedures / Treatments   Labs (all labs ordered are listed, but only  abnormal results are displayed) Labs Reviewed  BASIC METABOLIC PANEL - Abnormal; Notable for the following components:      Result Value   Chloride 115 (*)    CO2 18 (*)    Glucose, Bld 106 (*)    Calcium 8.2 (*)    All other components within normal limits  CBC - Abnormal; Notable for the following components:   WBC 13.5 (*)    All other components within normal limits  TSH  T4, FREE  MAGNESIUM  POC URINE PREG, ED  TROPONIN I (HIGH SENSITIVITY)     EKG Interpreted by me Atrial fibrillation, rate of 113.  Normal axis intervals QRS ST segments and T waves  Repeat EKG shows normal sinus rhythm, rate of 80.  RADIOLOGY Chest x-ray interpreted by me, unremarkable.  Radiology report reviewed   PROCEDURES:  Procedures   MEDICATIONS ORDERED IN ED: Medications  metoprolol tartrate (LOPRESSOR) tablet 25 mg (has no administration in time range)     IMPRESSION / MDM / ASSESSMENT AND PLAN / ED COURSE  I reviewed  the triage vital signs and the nursing notes.                              Differential diagnosis includes, but is not limited to, holiday heart syndrome, electrolyte abnormality, anemia, hyperthyroidism  Patient's presentation is most consistent with acute presentation with potential threat to life or bodily function.  Patient presents with episode of chest pain and shortness of breath due to atrial fibrillation with RVR.  On arrival, heart rate is controlled, and subsequently patient has most likely spontaneously converted back to sinus rhythm.  Labs are all unremarkable.  She is indicated that she intends to discontinue taking losartan for blood pressure because of her mother reporting having similar symptoms in the past which she attributes to side effects to losartan.  Will replace this with metoprolol for now to maintain rate control pending cardiology follow-up.       FINAL CLINICAL IMPRESSION(S) / ED DIAGNOSES   Final diagnoses:  Atrial fibrillation,  unspecified type ([place])     Rx / DC Orders   ED Discharge Orders          Ordered    metoprolol tartrate (LOPRESSOR) 25 MG tablet  2 times daily        03/06/22 1510    Ambulatory referral to Cardiology       Comments: If you have not heard from the Cardiology office within the next 72 hours please call [phone].   03/06/22 1512             Note:  This document was prepared using Dragon voice recognition software and may include unintentional dictation errors.   Carrie Mew, MD 03/06/22 [phone]

## 2022-03-06 NOTE — ED Notes (Signed)
Pt given 2 cups of water with ice as requested. Pt's resp reg/unlabored, skin dry and sitting calmly on stretcher. Visitors remain with pt.

## 2022-03-06 NOTE — Discharge Instructions (Addendum)
Your lab tests were all normal today.  Your electrolytes and thyroid function are normal.  Take metoprolol 2 times a day as prescribed to help prevent further episodes of atrial fibrillation.  Avoid alcohol as this may be contributing to the arrhythmia.  Please follow-up with cardiology for further assessment.

## 2022-03-06 NOTE — ED Notes (Signed)
EKG to [place] in person. Attempted to local provider sooner but he was not at desk as was in another pt's room completing a procedure.

## 2022-04-14 ENCOUNTER — Ambulatory Visit
Admission: RE | Admit: 2022-04-14 | Discharge: 2022-04-14 | Disposition: A | Discharge status: Home / Self Care | Payer: Medicaid Other | Source: Ambulatory Visit

## 2022-04-14 VITALS — BP 108/76 | HR 87 | Temp 98.1°F | Resp 18

## 2022-04-14 DIAGNOSIS — F17210 Nicotine dependence, cigarettes, uncomplicated: Secondary | ICD-10-CM | POA: Insufficient documentation

## 2022-04-14 DIAGNOSIS — Z7951 Long term (current) use of inhaled steroids: Secondary | ICD-10-CM | POA: Insufficient documentation

## 2022-04-14 DIAGNOSIS — I10 Essential (primary) hypertension: Secondary | ICD-10-CM | POA: Diagnosis not present

## 2022-04-14 DIAGNOSIS — J45909 Unspecified asthma, uncomplicated: Secondary | ICD-10-CM | POA: Diagnosis not present

## 2022-04-14 DIAGNOSIS — Z1152 Encounter for screening for COVID-19: Secondary | ICD-10-CM | POA: Insufficient documentation

## 2022-04-14 DIAGNOSIS — R067 Sneezing: Secondary | ICD-10-CM | POA: Insufficient documentation

## 2022-04-14 DIAGNOSIS — J45901 Unspecified asthma with (acute) exacerbation: Secondary | ICD-10-CM | POA: Diagnosis present

## 2022-04-14 DIAGNOSIS — I4891 Unspecified atrial fibrillation: Secondary | ICD-10-CM | POA: Insufficient documentation

## 2022-04-14 DIAGNOSIS — R051 Acute cough: Secondary | ICD-10-CM | POA: Diagnosis not present

## 2022-04-14 MED ORDER — BENZONATATE 100 MG PO CAPS: 100.0000 mg | ORAL_CAPSULE | Freq: Three times a day (TID) | ORAL | 0 refills | Status: DC | PRN

## 2022-04-14 MED ORDER — PREDNISONE 10 MG PO TABS
40.0000 mg | ORAL_TABLET | Freq: Every day | ORAL | 0 refills | Status: AC
Start: 2022-04-14 — End: 2022-04-19

## 2022-04-14 NOTE — ED Provider Notes (Signed)
Roderic Palau    CSN: [id number] Arrival date & time: 04/14/22  1358      History   Chief Complaint Chief Complaint  Patient presents with   Nasal Congestion    Sneezing coughing head hurting - Entered by patient    HPI Carol Small is a 37 y.o. female.  Patient presents with 3-day history of congestion, runny nose, sneezing, headache, cough, shortness of breath, no taste or smell.  Treating with Flonase nasal spray and saline nasal spray.  No fever, rash, sore throat, chest pain, vomiting, diarrhea, or other symptoms.  Negative COVID test at home on day 1 of symptoms.  Patient was seen at [facility] ED on 03/06/2022; diagnosed with atrial fibrillation; treated with metoprolol.  Her medical history includes asthma, current smoker, hypertension.   The history is provided by the patient and medical records.    Past Medical History:  Diagnosis Date   Anxiety    GERD (gastroesophageal reflux disease)    Hypertension    NO MEDS-NEVER FOLLOWED BACK UP WITH PCP   S/P LEEP of cervix     Patient Active Problem List   Diagnosis Date Noted   Alcohol use 08/06/2021   Current smoker 08/06/2021   GERD (gastroesophageal reflux disease) 08/06/2021   History of lumpectomy of left breast 08/06/2021   Other insomnia 05/06/2021   Routine health maintenance 06/05/2020   Hypokalemia 06/05/2020   Mild obstructive sleep apnea 06/05/2020   Asthma 07/25/2019   Benign paroxysmal positional vertigo 07/25/2019   Nonintractable headache 07/25/2019   Post concussion syndrome 07/25/2019   Uterine scar from previous cesarean delivery affecting pregnancy 10/20/2018   S/P cesarean section 10/20/2018   Indication for care in labor and delivery, antepartum 10/12/2018   Polyhydramnios affecting pregnancy in third trimester 10/04/2018   Abdominal pain during pregnancy in third trimester 09/25/2018   Polyhydramnios in third trimester 09/23/2018   History of cesarean section complicating  pregnancy 78/29/5621   Right upper quadrant abdominal pain affecting pregnancy in third trimester 09/04/2018   Anxiety    Chronic hypertension affecting pregnancy 06/09/2018   Tobacco use affecting pregnancy, antepartum 05/10/2018   History of pre-eclampsia in prior pregnancy, currently pregnant 05/10/2018   Supervision of high risk pregnancy, antepartum 03/23/2018   Obesity affecting pregnancy, antepartum 03/23/2018   Hypertension 02/18/2018   Major depressive disorder 02/18/2018   Mood disorder ([place]) 02/18/2018    Past Surgical History:  Procedure Laterality Date   BREAST LUMPECTOMY Left 07/01/2015   Procedure: BREAST LUMPECTOMY;  Surgeon: Christene Lye, MD;  Location: [facility] ORS;  Service: General;  Laterality: Left;   CESAREAN SECTION  05/11/2011   fetal intolerance   CESAREAN SECTION N/A 10/20/2018   Procedure: CESAREAN SECTION;  Surgeon: Malachy Mood, MD;  Location: [facility] ORS;  Service: Obstetrics;  Laterality: N/A;   FOOT SURGERY      OB History     Gravida  3   Para  2   Term  2   Preterm      AB  1   Living  2      SAB  1   IAB      Ectopic      Multiple  0   Live Births  2            Home Medications    Prior to Admission medications   Medication Sig Start Date End Date Taking? Authorizing Provider  benzonatate (TESSALON) 100 MG capsule Take 1 capsule (100  mg total) by mouth 3 (three) times daily as needed for cough. 04/14/22  Yes Sharion Balloon, NP  chlorthalidone (HYGROTON) 25 MG tablet Take by mouth. 07/01/21 07/01/22 Yes [provider]  predniSONE (DELTASONE) 10 MG tablet Take 4 tablets (40 mg total) by mouth daily for 5 days. 04/14/22 04/19/22 Yes Sharion Balloon, NP  albuterol (VENTOLIN HFA) 108 (90 Base) MCG/ACT inhaler Inhale into the lungs. 08/06/21   [provider]  amLODipine (NORVASC) 10 MG tablet Take by mouth. 07/25/19 07/24/20  [provider]  amLODipine (NORVASC) 10 MG tablet Take by mouth. 07/25/19  08/06/22  [provider]  brompheniramine-pseudoephedrine-DM 30-2-10 MG/5ML syrup Take 5 mLs by mouth 4 (four) times daily as needed. Patient not taking: Reported on 04/04/2020 03/12/20   Sable Feil, PA-C  chlorthalidone (HYGROTON) 25 MG tablet Take by mouth. 06/02/19 06/01/20  [provider]  escitalopram (LEXAPRO) 10 MG tablet TAKE 1 TABLET(10 MG) BY MOUTH DAILY 04/09/20   Malachy Mood, MD  famotidine (PEPCID) 20 MG tablet Take 1 tablet (20 mg total) by mouth 2 (two) times daily. 07/07/18   Rexene Agent, CNM  famotidine (PEPCID) 20 MG tablet Take by mouth. 08/06/21   [provider]  ibuprofen (ADVIL) 600 MG tablet Take 1 tablet (600 mg total) by mouth every 8 (eight) hours as needed. 03/12/20   Sable Feil, PA-C  labetalol (NORMODYNE) 300 MG tablet Take 1 tablet (300 mg total) by mouth 2 (two) times daily. Patient not taking: Reported on 06/13/2020 12/16/18   Malachy Mood, MD  loperamide (IMODIUM) 2 MG capsule Take 1 capsule (2 mg total) by mouth 4 (four) times daily as needed for diarrhea or loose stools. 10/29/21   Hans Eden, NP  meclizine (ANTIVERT) 25 MG tablet Take by mouth. 06/05/20   [provider]  medroxyPROGESTERone (DEPO-PROVERA) 150 MG/ML injection Inject 1 mL (150 mg total) into the muscle every 3 (three) months. 04/04/20 07/03/20  Malachy Mood, MD  metFORMIN (GLUCOPHAGE-XR) 500 MG 24 hr tablet Take by mouth. 06/05/20 06/05/21  [provider]  metoprolol tartrate (LOPRESSOR) 25 MG tablet Take 1 tablet (25 mg total) by mouth 2 (two) times daily. 03/06/22 04/05/22  Carrie Mew, MD  ondansetron (ZOFRAN-ODT) 4 MG disintegrating tablet Take 1 tablet (4 mg total) by mouth every 8 (eight) hours as needed for nausea or vomiting. 10/29/21   White, Leitha Schuller, NP  potassium chloride SA (KLOR-CON) 20 MEQ tablet Take 20 mEq by mouth daily. 06/05/20   [provider]  Prenat-FeFmCb-DSS-FA-DHA w/o A (CITRANATAL HARMONY)  27-1-260 MG CAPS Take 1 tablet by mouth daily. Patient not taking: Reported on 02/03/2019 03/30/18   Gae Dry, MD  sucralfate (CARAFATE) 1 g tablet Take 1 tablet (1 g total) by mouth 4 (four) times daily. Patient not taking: Reported on 02/03/2019 10/04/18 10/04/19  Homero Fellers, MD  topiramate (TOPAMAX) 25 MG tablet 2 tablets in the morning for 2 weeks then 2 tablet twice daily 05/06/21   [provider]  traZODone (DESYREL) 50 MG tablet Take 1 tablet (50 mg total) by mouth at bedtime as needed for sleep. 09/04/18   Malachy Mood, MD    Family History Family History  Problem Relation Age of Onset   Thyroid cancer Maternal Grandmother    Hypertension Mother    Hypertension Father     Social History Social History   Tobacco Use   Smoking status: Every Day    Packs/day: 0.25  Years: 13.00    Total pack years: 3.25    Types: Cigarettes   Smokeless tobacco: Never  Vaping Use   Vaping Use: Never used  Substance Use Topics   Alcohol use: Not Currently    Alcohol/week: 0.0 standard drinks of alcohol    Comment: occ   Drug use: No     Allergies   Erythromycin base, Adhesive [tape], and Nickel   Review of Systems Review of Systems  Constitutional:  Negative for chills and fever.  HENT:  Positive for congestion, rhinorrhea and sneezing. Negative for ear pain and sore throat.   Respiratory:  Positive for cough and shortness of breath.   Cardiovascular:  Negative for chest pain and palpitations.  Gastrointestinal:  Negative for diarrhea and vomiting.  Skin:  Negative for color change and rash.  Neurological:  Negative for syncope.  All other systems reviewed and are negative.    Physical Exam Triage Vital Signs ED Triage Vitals  Enc Vitals Group     BP 04/14/22 1413 108/76     Pulse Rate 04/14/22 1413 87     Resp 04/14/22 1413 18     Temp 04/14/22 1413 98.1 F (36.7 C)     Temp src --      SpO2 04/14/22 1413 97 %     Weight --       Height --      Head Circumference --      Peak Flow --      Pain Score 04/14/22 1407 0     Pain Loc --      Pain Edu? --      Excl. in [place]? --    No data found.  Updated Vital Signs BP 108/76   Pulse 87   Temp 98.1 F (36.7 C)   Resp 18   SpO2 97%   Visual Acuity Right Eye Distance:   Left Eye Distance:   Bilateral Distance:    Right Eye Near:   Left Eye Near:    Bilateral Near:     Physical Exam Vitals and nursing note reviewed.  Constitutional:      General: She is not in acute distress.    Appearance: Normal appearance. She is well-developed. She is not ill-appearing.  HENT:     Right Ear: Tympanic membrane normal.     Left Ear: Tympanic membrane normal.     Nose: Congestion and rhinorrhea present.     Mouth/Throat:     Mouth: Mucous membranes are moist.     Pharynx: Oropharynx is clear.  Cardiovascular:     Rate and Rhythm: Normal rate and regular rhythm.     Heart sounds: Normal heart sounds.  Pulmonary:     Effort: Pulmonary effort is normal. No respiratory distress.     Breath sounds: Normal breath sounds. No wheezing.  Musculoskeletal:     Cervical back: Neck supple.  Skin:    General: Skin is warm and dry.  Neurological:     Mental Status: She is alert.  Psychiatric:        Mood and Affect: Mood normal.        Behavior: Behavior normal.      UC Treatments / Results  Labs (all labs ordered are listed, but only abnormal results are displayed) Labs Reviewed  SARS CORONAVIRUS 2 (TAT 6-24 HRS)    EKG   Radiology No results found.  Procedures Procedures (including critical care time)  Medications Ordered in UC Medications - No data to  display  Initial Impression / Assessment and Plan / UC Course  I have reviewed the triage vital signs and the nursing notes.  Pertinent labs & imaging results that were available during my care of the patient were reviewed by me and considered in my medical decision making (see chart for details).     Cough, asthma exacerbation.  No respiratory distress, O2 sat 97% on room air.  Treating with prednisone and continued use of albuterol inhaler.  Treating cough with Tessalon Perles.  COVID pending.  If COVID positive, recommend treatment with Paxlovid.  Last GFR >60 on 03/06/2022.  Discussed symptomatic treatment including Tylenol, rest, hydration.  Education provided on asthma.  Instructed patient to follow up with her PCP.  She agrees to plan of care.   Final Clinical Impressions(s) / UC Diagnoses   Final diagnoses:  Acute cough  Asthma with acute exacerbation, unspecified asthma severity, unspecified whether persistent     Discharge Instructions      Take the prednisone and Tessalon Perles as directed.  Continue to use your albuterol inhaler as directed.    Your COVID test is pending.    Take Tylenol as needed for fever or discomfort.  Rest and keep yourself hydrated.    Follow-up with your primary care provider.         ED Prescriptions     Medication Sig Dispense Auth. Provider   predniSONE (DELTASONE) 10 MG tablet Take 4 tablets (40 mg total) by mouth daily for 5 days. 20 tablet Sharion Balloon, NP   benzonatate (TESSALON) 100 MG capsule Take 1 capsule (100 mg total) by mouth 3 (three) times daily as needed for cough. 21 capsule Sharion Balloon, NP      PDMP not reviewed this encounter.   Sharion Balloon, NP 04/14/22 [phone]

## 2022-04-14 NOTE — ED Triage Notes (Addendum)
Patient to Urgent Care with complaints of URI symptoms/ nasal congestion/ sneezing/ coughing/ HA. Reports lack of taste and smell, states everything she eats tastes like metal. Symptoms started on Saturday.  Denies any known fevers. Using flonase/ nasal saline spray.   Negative covid test at home on Saturday.

## 2022-04-14 NOTE — Discharge Instructions (Addendum)
Take the prednisone and Tessalon Perles as directed.  Continue to use your albuterol inhaler as directed.    Your COVID test is pending.    Take Tylenol as needed for fever or discomfort.  Rest and keep yourself hydrated.    Follow-up with your primary care provider.

## 2022-04-15 LAB — SARS CORONAVIRUS 2 (TAT 6-24 HRS): SARS Coronavirus 2: NEGATIVE

## 2022-04-22 NOTE — Progress Notes (Unsigned)
New Outpatient Visit Date: 04/23/2022  Referring Provider: Brenton Grills, MD [facility] Emergency Department  Chief Complaint: Atrial fibrillation  HPI:  Ms. Fred is a 37 y.o. female who is being seen today for the evaluation of atrial fibrillation at the request of Dr. Joni Fears. She has a history of hypertension, GERD, asthma, and anxiety.  She presented to the [facility] emergency department on 03/06/2022 complaining of chest pain and shortness of breath.  Ms. Cazier reports that she had been feeling fairly well but suddenly began gagging while brushing her teeth.  She wonders if she may have inadvertently irritated the back of her throat.  She then began to feel her heart racing with associated dizziness, shortness of breath, and profound weakness.  She lied down in bed and tried to use her albuterol inhaler.  However, due to profound weakness, she was not even able to use the MDI.  She summoned EMS and was found to be in atrial fibrillation with a ventricular rate of 170 bpm.  She received IV diltiazem and route to the ED and was rate controlled on arrival.  ED note indicates that Ms. Anis had consumed quite a bit of alcohol in the days leading up to her visit, though today she notes she had only been drinking Red Bull the day before.  Ms. Blan reports a long history of sporadic palpitations, typically consisting of a transient flutter.  She had never had persistent palpitations like what led to her recent ED visit.  She denies chest pain at other times but has a long history of mild exertional dyspnea.  She also gets a little bit dizzy when she "moves too fast."  Since her ED visit, she has had mild dependent edema.  She is tolerating metoprolol well and felt like her heart rate was faster when she missed 1 dose.  She denies a history of heart disease and prior cardiac  testing.  --------------------------------------------------------------------------------------------------  Cardiovascular History & Procedures: Cardiovascular Problems: Paroxysmal atrial fibrillation  Risk Factors: Hypertension, tobacco use, and obesity  Cath/PCI: None  CV Surgery: None  EP Procedures and Devices: None  Non-Invasive Evaluation(s): None  Recent CV Pertinent Labs: Lab Results  Component Value Date   K 3.9 03/06/2022   MG 2.4 03/06/2022   BUN 12 03/06/2022   BUN 10 09/30/2018   CREATININE 0.59 03/06/2022    --------------------------------------------------------------------------------------------------  Past Medical History:  Diagnosis Date   Anxiety    GERD (gastroesophageal reflux disease)    Hypertension    NO MEDS-NEVER FOLLOWED BACK UP WITH PCP   S/P LEEP of cervix     Past Surgical History:  Procedure Laterality Date   BREAST LUMPECTOMY Left 07/01/2015   Procedure: BREAST LUMPECTOMY;  Surgeon: Christene Lye, MD;  Location: [facility] ORS;  Service: General;  Laterality: Left;   CESAREAN SECTION  05/11/2011   fetal intolerance   CESAREAN SECTION N/A 10/20/2018   Procedure: CESAREAN SECTION;  Surgeon: Malachy Mood, MD;  Location: [facility] ORS;  Service: Obstetrics;  Laterality: N/A;   FOOT SURGERY      Current Meds  Medication Sig   albuterol (VENTOLIN HFA) 108 (90 Base) MCG/ACT inhaler Inhale into the lungs.   amLODipine (NORVASC) 10 MG tablet Take by mouth.   chlorthalidone (HYGROTON) 25 MG tablet Take by mouth.   escitalopram (LEXAPRO) 10 MG tablet TAKE 1 TABLET(10 MG) BY MOUTH DAILY   famotidine (PEPCID) 20 MG tablet Take 1 tablet (20 mg total) by mouth 2 (two) times  daily.   ibuprofen (ADVIL) 600 MG tablet Take 1 tablet (600 mg total) by mouth every 8 (eight) hours as needed.   loperamide (IMODIUM) 2 MG capsule Take 1 capsule (2 mg total) by mouth 4 (four) times daily as needed for diarrhea or loose stools.   meclizine  (ANTIVERT) 25 MG tablet Take by mouth.   metoprolol tartrate (LOPRESSOR) 25 MG tablet Take 1 tablet (25 mg total) by mouth 2 (two) times daily.   ondansetron (ZOFRAN-ODT) 4 MG disintegrating tablet Take 1 tablet (4 mg total) by mouth every 8 (eight) hours as needed for nausea or vomiting.   topiramate (TOPAMAX) 25 MG tablet 2 tablets in the morning for 2 weeks then 2 tablet twice daily   traZODone (DESYREL) 50 MG tablet Take 1 tablet (50 mg total) by mouth at bedtime as needed for sleep.   Current Facility-Administered Medications for the 04/23/22 encounter (Office Visit) with Jonathin Heinicke, Harrell Gave, MD  Medication   medroxyPROGESTERone (DEPO-PROVERA) injection 150 mg    Allergies: Erythromycin base, Adhesive [tape], Nickel, and Wound dressing adhesive  Social History   Tobacco Use   Smoking status: Every Day    Packs/day: 0.50    Years: 13.00    Total pack years: 6.50    Types: Cigarettes   Smokeless tobacco: Never  Vaping Use   Vaping Use: Never used  Substance Use Topics   Alcohol use: Yes    Alcohol/week: 4.0 standard drinks of alcohol    Types: 4 Cans of beer per week   Drug use: No    Family History  Problem Relation Age of Onset   Hyperlipidemia Mother    Hypertension Mother    Heart murmur Mother    Hypertension Father    Arrhythmia Maternal Aunt    Heart failure Maternal Aunt    Heart attack Maternal Aunt    Heart disease Maternal Aunt    Hypertension Maternal Aunt    Stroke Maternal Aunt    Thyroid cancer Maternal Grandmother     Review of Systems: Ms. Bookwalter reports intermittent tingling in her fingers, which is positional and dates back to her pregnancy.  Otherwise, she has not had any focal neurologic symptoms.  --------------------------------------------------------------------------------------------------  Physical Exam: BP 122/82 (BP Location: Left Arm, Patient Position: Sitting, Cuff Size: Normal)   Pulse 74   Ht 5' 2"$  (1.575 m)   Wt 175 lb 4 oz (79.5  kg)   SpO2 98%   BMI 32.05 kg/m   General:  NAD.  Accompanied by her mother. HEENT: No conjunctival pallor or scleral icterus. Neck: Supple without lymphadenopathy, thyromegaly, JVD, or HJR. No carotid bruit. Lungs: Normal work of breathing. Clear to auscultation bilaterally without wheezes or crackles. Heart: Regular rate and rhythm without murmurs, rubs, or gallops. Non-displaced PMI. Abd: Bowel sounds present. Soft, NT/ND without hepatosplenomegaly Ext: No lower extremity edema. Radial, PT, and DP pulses are 2+ bilaterally Skin: Warm and dry without rash. Neuro: CNIII-XII intact. Strength and fine-touch sensation intact in upper and lower extremities bilaterally. Psych: Normal mood and affect.  EKG: Normal sinus rhythm without abnormalities.  Lab Results  Component Value Date   WBC 13.5 (H) 03/06/2022   HGB 14.9 03/06/2022   HCT 45.1 03/06/2022   MCV 90.2 03/06/2022   PLT 366 03/06/2022    Lab Results  Component Value Date   NA 141 03/06/2022   K 3.9 03/06/2022   CL 115 (H) 03/06/2022   CO2 18 (L) 03/06/2022   BUN 12 03/06/2022  CREATININE 0.59 03/06/2022   GLUCOSE 106 (H) 03/06/2022   ALT 18 10/20/2018   Lab Results  Component Value Date   TSH 0.898 03/06/2022   --------------------------------------------------------------------------------------------------  ASSESSMENT AND PLAN: Paroxysmal atrial fibrillation: Ms. Sky reports a long history of brief palpitations but had never had a sustained episode like what brought her to the ED in late December.  EKG confirmed atrial fibrillation with rapid ventricular response.  She spontaneously converted to sinus rhythm while in the ED.  Workup was largely unrevealing other than mild leukocytosis that could have been a stress response to atrial fibrillation with rapid ventricular response.  Notably, her thyroid function tests were normal.  She feels a little better since starting on the low-dose metoprolol, which we will  plan to continue.  Given some sporadic palpitations, we have agreed to place a 14-day event monitor (ZIO XT).  We have also agreed to obtain an echocardiogram to exclude underlying structural abnormalities.  I encouraged Ms. Wolfson to minimize her alcohol and caffeine intake.  We discussed role for anticoagulation in the setting of her CHA2DS2-VASc score of 2 (gender and hypertension).  Given that she is a young female, we will defer anticoagulation at this time.  It would be worthwhile to check a hemoglobin A1c with her next blood draw to exclude underlying diabetes mellitus (random blood glucose on 03/06/2022 was only minimally elevated at 106.  Hypertension: Blood pressure reasonable today.  No medication changes at this time.  Follow-up: Return to clinic in 6 weeks.  Nelva Bush, MD 04/23/2022 9:54 AM

## 2022-04-23 ENCOUNTER — Ambulatory Visit: Payer: Medicaid Other | Attending: Internal Medicine | Admitting: Internal Medicine

## 2022-04-23 ENCOUNTER — Encounter: Payer: Self-pay | Admitting: Internal Medicine

## 2022-04-23 ENCOUNTER — Ambulatory Visit: Payer: Medicaid Other

## 2022-04-23 VITALS — BP 122/82 | HR 74 | Ht 62.0 in | Wt 175.2 lb

## 2022-04-23 DIAGNOSIS — I10 Essential (primary) hypertension: Secondary | ICD-10-CM

## 2022-04-23 DIAGNOSIS — I480 Paroxysmal atrial fibrillation: Secondary | ICD-10-CM

## 2022-04-23 NOTE — Patient Instructions (Signed)
Medication Instructions:  Your Physician recommend you continue on your current medication as directed.    *If you need a refill on your cardiac medications before your next appointment, please call your pharmacy*   Lab Work: None ordered today   Testing/Procedures: Your physician has requested that you have an echocardiogram. Echocardiography is a painless test that uses sound waves to create images of your heart. It provides your doctor with information about the size and shape of your heart and how well your heart's chambers and valves are working.   You may receive an ultrasound enhancing agent through an IV if needed to better visualize your heart during the echo. This procedure takes approximately one hour.  There are no restrictions for this procedure.  This will take place at [facility] ([place] [id number], [place]  Your physician has recommended that you wear a 14 day Zio monitor.   This monitor is a medical device that records the heart's electrical activity. Doctors most often use these monitors to diagnose arrhythmias. Arrhythmias are problems with the speed or rhythm of the heartbeat. The monitor is a small device applied to your chest. You can wear one while you do your normal daily activities. While wearing this monitor if you have any symptoms to push the button and record what you felt. Once you have worn this monitor for the period of time provider prescribed (Usually 14 days), you will return the monitor device in the postage paid box. Once it is returned they will download the data collected and provide [place] with a report which the provider will then review and we will call you with those results. Important tips:  Avoid showering during the first 24 hours of wearing the monitor. Avoid excessive sweating to help maximize wear time. Do not submerge the device, no hot tubs, and no swimming pools. Keep any lotions or oils away from the patch. After  24 hours you may shower with the patch on. Take brief showers with your back facing the shower head.  Do not remove patch once it has been placed because that will interrupt data and decrease adhesive wear time. Push the button when you have any symptoms and write down what you were feeling. Once you have completed wearing your monitor, remove and place into box which has postage paid and place in your outgoing mailbox.  If for some reason you have misplaced your box then call our office and we can provide another box and/or mail it off for you.     Follow-Up: At [facility], you and your health needs are our priority.  As part of our continuing mission to provide you with exceptional heart care, we have created designated Provider Care Teams.  These Care Teams include your primary Cardiologist (physician) and Advanced Practice Providers (APPs -  Physician Assistants and Nurse Practitioners) who all work together to provide you with the care you need, when you need it.  We recommend signing up for the patient portal called "MyChart".  Sign up information is provided on this After Visit Summary.  MyChart is used to connect with patients for Virtual Visits (Telemedicine).  Patients are able to view lab/test results, encounter notes, upcoming appointments, etc.  Non-urgent messages can be sent to your provider as well.   To learn more about what you can do with MyChart, go to [email or web address].    Your next appointment:   6 week(s)  Provider:   You may see Harrell Gave  End, MD or one of the following Advanced Practice Providers on your designated Care Team:   Murray Hodgkins, NP Christell Faith, PA-C Cadence Kathlen Mody, PA-C Gerrie Nordmann, NP

## 2022-06-01 ENCOUNTER — Ambulatory Visit
Admission: RE | Admit: 2022-06-01 | Discharge: 2022-06-01 | Disposition: A | Discharge status: Home / Self Care | Payer: Medicaid Other | Source: Ambulatory Visit

## 2022-06-01 VITALS — BP 121/80 | HR 79 | Temp 98.4°F | Resp 16 | Ht 62.0 in | Wt 175.0 lb

## 2022-06-01 DIAGNOSIS — J029 Acute pharyngitis, unspecified: Secondary | ICD-10-CM | POA: Diagnosis not present

## 2022-06-01 LAB — POCT RAPID STREP A (OFFICE): Rapid Strep A Screen: NEGATIVE

## 2022-06-01 NOTE — ED Triage Notes (Signed)
Patient to Urgent Care with complaints of sore throat that radiates into her left ear. Pain when swallowing.  Symptoms started 3 days ago. Denies any known fevers.

## 2022-06-01 NOTE — Discharge Instructions (Addendum)
Follow up here or with your primary care provider if your symptoms are worsening or not improving.     

## 2022-06-01 NOTE — ED Provider Notes (Signed)
Carol Small    CSN: [id number] Arrival date & time: 06/01/22  1425      History   Chief Complaint Chief Complaint  Patient presents with   Sore Throat    It's making my left ear hurt as well - Entered by patient    HPI Carol Small is a 37 y.o. female.    Sore Throat    Presents to urgent care with complaint of sore throat radiating to her right ear.  Associated symptoms of pain when swallowing.  Symptoms started 3 days ago.  Past Medical History:  Diagnosis Date   Anxiety    GERD (gastroesophageal reflux disease)    Hypertension    NO MEDS-NEVER FOLLOWED BACK UP WITH PCP   S/P LEEP of cervix     Patient Active Problem List   Diagnosis Date Noted   Paroxysmal atrial fibrillation ([place]) 04/23/2022   Alcohol use 08/06/2021   Current smoker 08/06/2021   GERD (gastroesophageal reflux disease) 08/06/2021   History of lumpectomy of left breast 08/06/2021   Other insomnia 05/06/2021   Routine health maintenance 06/05/2020   Hypokalemia 06/05/2020   Mild obstructive sleep apnea 06/05/2020   Asthma 07/25/2019   Benign paroxysmal positional vertigo 07/25/2019   Nonintractable headache 07/25/2019   Post concussion syndrome 07/25/2019   Uterine scar from previous cesarean delivery affecting pregnancy 10/20/2018   S/P cesarean section 10/20/2018   Indication for care in labor and delivery, antepartum 10/12/2018   Polyhydramnios affecting pregnancy in third trimester 10/04/2018   Abdominal pain during pregnancy in third trimester 09/25/2018   Polyhydramnios in third trimester 09/23/2018   History of cesarean section complicating pregnancy 0000000   Right upper quadrant abdominal pain affecting pregnancy in third trimester 09/04/2018   Anxiety    Chronic hypertension affecting pregnancy 06/09/2018   Tobacco use affecting pregnancy, antepartum 05/10/2018   History of pre-eclampsia in prior pregnancy, currently pregnant 05/10/2018   Supervision of high  risk pregnancy, antepartum 03/23/2018   Obesity affecting pregnancy, antepartum 03/23/2018   Essential hypertension 02/18/2018   Major depressive disorder 02/18/2018   Mood disorder ([place]) 02/18/2018    Past Surgical History:  Procedure Laterality Date   BREAST LUMPECTOMY Left 07/01/2015   Procedure: BREAST LUMPECTOMY;  Surgeon: Christene Lye, MD;  Location: [facility] ORS;  Service: General;  Laterality: Left;   CESAREAN SECTION  05/11/2011   fetal intolerance   CESAREAN SECTION N/A 10/20/2018   Procedure: CESAREAN SECTION;  Surgeon: Malachy Mood, MD;  Location: [facility] ORS;  Service: Obstetrics;  Laterality: N/A;   FOOT SURGERY      OB History     Gravida  3   Para  2   Term  2   Preterm      AB  1   Living  2      SAB  1   IAB      Ectopic      Multiple  0   Live Births  2            Home Medications    Prior to Admission medications   Medication Sig Start Date End Date Taking? Authorizing Provider  chlorthalidone (HYGROTON) 25 MG tablet Take by mouth. 06/02/19 07/01/22 Yes [provider]  albuterol (VENTOLIN HFA) 108 (90 Base) MCG/ACT inhaler Inhale into the lungs. 08/06/21   [provider]  amLODipine (NORVASC) 10 MG tablet Take by mouth. 07/25/19 04/23/22  [provider]  chlorthalidone (HYGROTON) 25 MG tablet Take  by mouth. 06/02/19 04/23/22  [provider]  escitalopram (LEXAPRO) 10 MG tablet TAKE 1 TABLET(10 MG) BY MOUTH DAILY 04/09/20   Malachy Mood, MD  famotidine (PEPCID) 20 MG tablet Take 1 tablet (20 mg total) by mouth 2 (two) times daily. 07/07/18   Rexene Agent, CNM  ibuprofen (ADVIL) 600 MG tablet Take 1 tablet (600 mg total) by mouth every 8 (eight) hours as needed. 03/12/20   Sable Feil, PA-C  loperamide (IMODIUM) 2 MG capsule Take 1 capsule (2 mg total) by mouth 4 (four) times daily as needed for diarrhea or loose stools. 10/29/21   Hans Eden, NP  meclizine (ANTIVERT) 25 MG tablet Take  by mouth. 06/05/20   [provider]  metoprolol tartrate (LOPRESSOR) 25 MG tablet Take 1 tablet (25 mg total) by mouth 2 (two) times daily. 03/06/22 04/23/22  Carrie Mew, MD  ondansetron (ZOFRAN-ODT) 4 MG disintegrating tablet Take 1 tablet (4 mg total) by mouth every 8 (eight) hours as needed for nausea or vomiting. 10/29/21   Hans Eden, NP  topiramate (TOPAMAX) 25 MG tablet 2 tablets in the morning for 2 weeks then 2 tablet twice daily 05/06/21   [provider]  traZODone (DESYREL) 50 MG tablet Take 1 tablet (50 mg total) by mouth at bedtime as needed for sleep. 09/04/18   Malachy Mood, MD    Family History Family History  Problem Relation Age of Onset   Hyperlipidemia Mother    Hypertension Mother    Heart murmur Mother    Hypertension Father    Arrhythmia Maternal Aunt    Heart failure Maternal Aunt    Heart attack Maternal Aunt    Heart disease Maternal Aunt    Hypertension Maternal Aunt    Stroke Maternal Aunt    Thyroid cancer Maternal Grandmother     Social History Social History   Tobacco Use   Smoking status: Every Day    Packs/day: 0.50    Years: 13.00    Additional pack years: 0.00    Total pack years: 6.50    Types: Cigarettes   Smokeless tobacco: Never  Vaping Use   Vaping Use: Never used  Substance Use Topics   Alcohol use: Yes    Alcohol/week: 4.0 standard drinks of alcohol    Types: 4 Cans of beer per week   Drug use: No     Allergies   Erythromycin base, Adhesive [tape], Nickel, and Wound dressing adhesive   Review of Systems Review of Systems   Physical Exam Triage Vital Signs ED Triage Vitals  Enc Vitals Group     BP 06/01/22 1455 121/80     Pulse Rate 06/01/22 1455 79     Resp 06/01/22 1455 16     Temp 06/01/22 1455 98.4 F (36.9 C)     Temp Source 06/01/22 1455 Oral     SpO2 06/01/22 1455 99 %     Weight 06/01/22 1503 175 lb (79.4 kg)     Height 06/01/22 1503 5\' 2"  (1.575 m)     Head  Circumference --      Peak Flow --      Pain Score 06/01/22 1502 6     Pain Loc --      Pain Edu? --      Excl. in [place]? --    No data found.  Updated Vital Signs BP 121/80 (BP Location: Left Arm)   Pulse 79   Temp 98.4 F (36.9 C) (Oral)  Resp 16   Ht 5\' 2"  (1.575 m)   Wt 175 lb (79.4 kg)   LMP 05/18/2022   SpO2 99%   BMI 32.01 kg/m   Visual Acuity Right Eye Distance:   Left Eye Distance:   Bilateral Distance:    Right Eye Near:   Left Eye Near:    Bilateral Near:     Physical Exam Vitals reviewed.  Constitutional:      Appearance: She is well-developed.  HENT:     Right Ear: Tympanic membrane normal.     Left Ear: Tympanic membrane normal.     Mouth/Throat:     Pharynx: Posterior oropharyngeal erythema present. No oropharyngeal exudate.     Tonsils: No tonsillar exudate. 2+ on the right. 2+ on the left.  Skin:    General: Skin is warm and dry.  Neurological:     General: No focal deficit present.     Mental Status: She is alert and oriented to person, place, and time.  Psychiatric:        Mood and Affect: Mood normal.        Behavior: Behavior normal.      UC Treatments / Results  Labs (all labs ordered are listed, but only abnormal results are displayed) Labs Reviewed  POCT RAPID STREP A (OFFICE)    EKG   Radiology No results found.  Procedures Procedures (including critical care time)  Medications Ordered in UC Medications - No data to display  Initial Impression / Assessment and Plan / UC Course  I have reviewed the triage vital signs and the nursing notes.  Pertinent labs & imaging results that were available during my care of the patient were reviewed by me and considered in my medical decision making (see chart for details).   Patient is afebrile here without recent antipyretics. Satting well on room air. Overall is well appearing, well hydrated, without respiratory distress.  Mild pharyngeal erythema is present but without  peritonsillar exudates.  Tonsils are 2+ bilaterally.  Rapid strep is negative.  Presumed viral pharyngitis.  Recommending continued use of OTC medication such as ibuprofen for symptom control.  Final Clinical Impressions(s) / UC Diagnoses   Final diagnoses:  None   Discharge Instructions   None    ED Prescriptions   None    PDMP not reviewed this encounter.   Rose Phi, [place] 06/01/22 1531

## 2022-06-05 ENCOUNTER — Ambulatory Visit: Payer: Medicaid Other | Admitting: Nurse Practitioner

## 2022-06-09 ENCOUNTER — Ambulatory Visit: Payer: Medicaid Other

## 2022-06-12 ENCOUNTER — Ambulatory Visit: Payer: Medicaid Other | Attending: Internal Medicine

## 2022-06-12 ENCOUNTER — Telehealth: Payer: Self-pay

## 2022-06-12 DIAGNOSIS — I480 Paroxysmal atrial fibrillation: Secondary | ICD-10-CM

## 2022-06-12 NOTE — Telephone Encounter (Signed)
I agree with reordering 14 day monitor.  Thanks.  Yvonne Kendall, MD [facility]

## 2022-06-12 NOTE — Telephone Encounter (Signed)
Called pt regarding monitor ordered on 04/23/22. Pt stated she received two monitors and returned both. One unused and one she wore for 14 days.    Based on [employer] website, both monitors were returned with the following monitor status. Zio # 1- Returned Banker # 2- No data available (device not activated)  Nurse contacted [employer] rep who reported the second monitor was worn, however pt forgot to activated it. Rep indicated pt would not be charged and recommended reordering.  Pt made aware and agreeable to plan. Will forward to MD for approval.

## 2022-06-12 NOTE — Telephone Encounter (Signed)
14 day monitor order placed

## 2022-06-24 ENCOUNTER — Telehealth: Payer: Self-pay

## 2022-06-24 ENCOUNTER — Ambulatory Visit: Payer: Medicaid Other | Admitting: Nurse Practitioner

## 2022-06-24 NOTE — Telephone Encounter (Signed)
Incoming email -   Hello,    This email is to notify you that the patient listed below had an unexpected issue where the patch was damaged before applying. We have placed an order for a replacement device to be shipped to the patient and placed a hold on the billing, so the patient is not charged for the first device. If you have any questions, please respond to this email, or call us at [phone].     Patient initials: A. R  Patient ID: [id number]  SN: [id number]  Ticket: [id number] ORDER: [id number]   Account: [facility] [place]     Thank you,  Kandis Fantasia Customer Care

## 2022-06-30 DIAGNOSIS — I480 Paroxysmal atrial fibrillation: Secondary | ICD-10-CM

## 2022-07-08 ENCOUNTER — Telehealth: Payer: Self-pay | Admitting: Internal Medicine

## 2022-07-08 NOTE — Telephone Encounter (Signed)
Patient stated she has been wearing her heart monitor since 4/23 and has developed red bumps which are itching at site of electrodes.  Patient believes she is allergic to the adhesive and wants to know next steps.

## 2022-07-09 ENCOUNTER — Other Ambulatory Visit: Payer: Medicaid Other

## 2022-07-09 NOTE — Telephone Encounter (Signed)
Per Dr. Okey Dupre, ok to discontinue use of monitor and mail back for review. Pt verbalized understanding.

## 2022-07-10 ENCOUNTER — Ambulatory Visit: Payer: Medicaid Other | Admitting: Nurse Practitioner

## 2022-07-28 ENCOUNTER — Ambulatory Visit: Payer: Medicaid Other | Attending: Internal Medicine

## 2022-07-28 DIAGNOSIS — I480 Paroxysmal atrial fibrillation: Secondary | ICD-10-CM | POA: Diagnosis not present

## 2022-07-29 LAB — ECHOCARDIOGRAM COMPLETE
AR max vel: 2.46 cm2
AV Area VTI: 2.48 cm2
AV Area mean vel: 2.42 cm2
AV Mean grad: 5 mmHg
AV Peak grad: 9.4 mmHg
Ao pk vel: 1.53 m/s
Area-P 1/2: 3.99 cm2
S' Lateral: 2.1 cm

## 2022-08-07 ENCOUNTER — Ambulatory Visit: Payer: Medicaid Other | Attending: Nurse Practitioner | Admitting: Nurse Practitioner

## 2022-08-07 ENCOUNTER — Encounter: Payer: Self-pay | Admitting: Nurse Practitioner

## 2022-08-07 VITALS — BP 158/100 | HR 74 | Ht 62.0 in | Wt 176.5 lb

## 2022-08-07 DIAGNOSIS — Z720 Tobacco use: Secondary | ICD-10-CM | POA: Diagnosis not present

## 2022-08-07 DIAGNOSIS — R0609 Other forms of dyspnea: Secondary | ICD-10-CM | POA: Diagnosis not present

## 2022-08-07 DIAGNOSIS — I480 Paroxysmal atrial fibrillation: Secondary | ICD-10-CM

## 2022-08-07 DIAGNOSIS — I10 Essential (primary) hypertension: Secondary | ICD-10-CM

## 2022-08-07 MED ORDER — VARENICLINE TARTRATE (STARTER) 0.5 MG X 11 & 1 MG X 42 PO TBPK: ORAL_TABLET | ORAL | 0 refills | Status: DC

## 2022-08-07 MED ORDER — VARENICLINE TARTRATE 1 MG PO TABS: 1.0000 mg | ORAL_TABLET | Freq: Two times a day (BID) | ORAL | 1 refills | Status: AC

## 2022-08-07 MED ORDER — METOPROLOL TARTRATE 25 MG PO TABS: 25.0000 mg | ORAL_TABLET | Freq: Two times a day (BID) | ORAL | 3 refills | Status: DC

## 2022-08-07 NOTE — Patient Instructions (Signed)
Medication Instructions:  Chantix:   Take one 0.5 mg tablet by mouth once daily for 3 days,  Then increase to one 0.5 mg tablet twice daily for 4 days,  Then increase to one 1 mg tablet twice daily. This is what you will remain on until completed. Two prescriptions have been sent into your pharmacy. One for the starter pack for the first month and then the one that will be started after the first month. You may pick up the Chantix 1 mg twice daily after the starter pack has been completed.   *If you need a refill on your cardiac medications before your next appointment, please call your pharmacy*   Lab Work: None ordered If you have labs (blood work) drawn today and your tests are completely normal, you will receive your results only by: MyChart Message (if you have MyChart) OR A paper copy in the mail If you have any lab test that is abnormal or we need to change your treatment, we will call you to review the results.   Testing/Procedures: None ordered   Follow-Up: At [facility], you and your health needs are our priority.  As part of our continuing mission to provide you with exceptional heart care, we have created designated Provider Care Teams.  These Care Teams include your primary Cardiologist (physician) and Advanced Practice Providers (APPs -  Physician Assistants and Nurse Practitioners) who all work together to provide you with the care you need, when you need it.  We recommend signing up for the patient portal called "MyChart".  Sign up information is provided on this After Visit Summary.  MyChart is used to connect with patients for Virtual Visits (Telemedicine).  Patients are able to view lab/test results, encounter notes, upcoming appointments, etc.  Non-urgent messages can be sent to your provider as well.   To learn more about what you can do with MyChart, go to [email or web address].    Your next appointment:   6 month(s)  Provider:   You may see  Yvonne Kendall, MD or one of the following Advanced Practice Providers on your designated Care Team:   Nicolasa Ducking, NP Eula Listen, PA-C Cadence Fransico Michael, PA-C Charlsie Quest, NP

## 2022-08-07 NOTE — Progress Notes (Signed)
Office Visit    Patient Name: Carol Small Date of Encounter: 08/07/2022  Primary Care Provider:  Margaretann Loveless, MD Primary Cardiologist:  Yvonne Kendall, MD  Chief Complaint    37 year old female with a history of paroxysmal atrial fibrillation, hypertension, GERD, asthma, and anxiety, who presents for follow-up of paroxysmal atrial fibrillation.  Past Medical History    Past Medical History:  Diagnosis Date   Anxiety    Diastolic dysfunction    a. 07/2022 Echo: EF 60-65%, no rwma, GrI DD, nl RV fxn, mild MR.   GERD (gastroesophageal reflux disease)    Hypertension    NO MEDS-NEVER FOLLOWED BACK UP WITH PCP   PAF (paroxysmal atrial fibrillation) ([facility])    a. Dx 02/2022; b. 06/2022 Zio: Predominantly sinus rhythm at 92 (58-179).  Rare PACs and PVCs.  Triggered events and equal sinus rhythm/PVCs.  No A-fib.   S/P LEEP of cervix    Past Surgical History:  Procedure Laterality Date   BREAST LUMPECTOMY Left 07/01/2015   Procedure: BREAST LUMPECTOMY;  Surgeon: Kieth Brightly, MD;  Location: [facility] ORS;  Service: General;  Laterality: Left;   CESAREAN SECTION  05/11/2011   fetal intolerance   CESAREAN SECTION N/A 10/20/2018   Procedure: CESAREAN SECTION;  Surgeon: Vena Austria, MD;  Location: [facility] ORS;  Service: Obstetrics;  Laterality: N/A;   FOOT SURGERY     Allergies  Allergies  Allergen Reactions   Erythromycin Base Other (See Comments)    Stomach cramps   Adhesive [Tape] Other (See Comments)    Skin becomes raw   Nickel Rash   Wound Dressing Adhesive Other (See Comments)    Skin becomes raw    History of Present Illness    37 year old female with above past medical history including paroxysmal atrial fibrillation, hypertension, GERD, asthma, and anxiety.  In December 2023 she had sudden onset of palpitations, dyspnea, chest pain, dizziness, and weakness.  EMS was summoned, and she was found to be in rapid atrial fibrillation at 170 for which, she  was treated with IV diltiazem.  She subsequently converted to sinus rhythm while in the emergency department and was placed on beta-blocker therapy.  Lab workup was unremarkable.  She subsequently wore a ZIO monitor which showed predominantly sinus rhythm at 92 bpm with rare PACs and PVCs.  Triggered events corresponded to sinus rhythm with PVCs.  No atrial fibrillation was noted.  An echocardiogram May 2024 showed EF of 60 to 65% with grade 1 diastolic dysfunction, normal RV function, and mild MR.  Since her last visit, she has not had any recurrent palpitations/fluttering.  She has ongoing dyspnea on exertion especially with inclines and steps, which has been present for some time.  She continues to smoke about a half a pack of cigarettes per day and notes that she is interested in quitting but feels like she would need pharmacologic help and would be willing to try Chantix.  Blood pressure is elevated today however, she has yet to take her morning medications including amlodipine, metoprolol, and chlorthalidone.  She denies chest pain, PND, orthopnea, dizziness, syncope, edema, or early satiety.  Home Medications    Current Outpatient Medications  Medication Sig Dispense Refill   albuterol (VENTOLIN HFA) 108 (90 Base) MCG/ACT inhaler Inhale into the lungs.     amLODipine (NORVASC) 10 MG tablet Take by mouth.     chlorthalidone (HYGROTON) 25 MG tablet Take 25 mg by mouth daily.     escitalopram (LEXAPRO)  10 MG tablet TAKE 1 TABLET(10 MG) BY MOUTH DAILY 30 tablet 1   famotidine (PEPCID) 20 MG tablet Take 1 tablet (20 mg total) by mouth 2 (two) times daily. 60 tablet 3   ibuprofen (ADVIL) 600 MG tablet Take 1 tablet (600 mg total) by mouth every 8 (eight) hours as needed. 15 tablet 0   loperamide (IMODIUM) 2 MG capsule Take 1 capsule (2 mg total) by mouth 4 (four) times daily as needed for diarrhea or loose stools. 12 capsule 0   meclizine (ANTIVERT) 25 MG tablet Take 25 mg by mouth 3 (three) times  daily as needed.     ondansetron (ZOFRAN-ODT) 4 MG disintegrating tablet Take 1 tablet (4 mg total) by mouth every 8 (eight) hours as needed for nausea or vomiting. 20 tablet 0   traZODone (DESYREL) 50 MG tablet Take 1 tablet (50 mg total) by mouth at bedtime as needed for sleep. 30 tablet 1   varenicline (CHANTIX CONTINUING MONTH PAK) 1 MG tablet Take 1 tablet (1 mg total) by mouth 2 (two) times daily. 60 tablet 1   Varenicline Tartrate, Starter, (CHANTIX STARTING MONTH PAK) 0.5 MG X 11 & 1 MG X 42 TBPK Take one 0.5 mg tablet by mouth once daily for 3 days, Then increase to one 0.5 mg tablet twice daily for 4 days, Then increase to one 1 mg tablet twice daily.. 53 each 0   metoprolol tartrate (LOPRESSOR) 25 MG tablet Take 1 tablet (25 mg total) by mouth 2 (two) times daily. 180 tablet 3   topiramate (TOPAMAX) 25 MG tablet 2 tablets in the morning for 2 weeks then 2 tablet twice daily (Patient not taking: Reported on 08/07/2022)     Current Facility-Administered Medications  Medication Dose Route Frequency Provider Last Rate Last Admin   medroxyPROGESTERone (DEPO-PROVERA) injection 150 mg  150 mg Intramuscular Once Conard Novak, MD         Review of Systems    Ongoing dyspnea on exertion.  She denies chest pain, palpitations, PND, orthopnea, dizziness, syncope, edema, or early satiety.  All other systems reviewed and are otherwise negative except as noted above.    Physical Exam    VS:  BP (!) 158/100   Pulse 74   Ht 5\' 2"  (1.575 m)   Wt 176 lb 8 oz (80.1 kg)   SpO2 98%   BMI 32.28 kg/m  , BMI Body mass index is 32.28 kg/m.     Vitals:   08/07/22 0946 08/07/22 1159  BP: (!) 140/110 (!) 158/100  Pulse: 74   SpO2: 98%     GEN: Well nourished, well developed, in no acute distress. HEENT: normal. Neck: Supple, no JVD, carotid bruits, or masses. Cardiac: RRR, no murmurs, rubs, or gallops. No clubbing, cyanosis, edema.  Radials 2+/PT 2+ and equal bilaterally.  Respiratory:   Respirations regular and unlabored, clear to auscultation bilaterally. GI: Soft, nontender, nondistended, BS + x 4. MS: no deformity or atrophy. Skin: warm and dry, no rash. Neuro:  Strength and sensation are intact. Psych: Normal affect.  Accessory Clinical Findings    ECG personally reviewed by me today -regular sinus rhythm, 74- no acute changes.  Lab Results  Component Value Date   WBC 13.5 (H) 03/06/2022   HGB 14.9 03/06/2022   HCT 45.1 03/06/2022   MCV 90.2 03/06/2022   PLT 366 03/06/2022   Lab Results  Component Value Date   CREATININE 0.59 03/06/2022   BUN 12 03/06/2022   NA  141 03/06/2022   K 3.9 03/06/2022   CL 115 (H) 03/06/2022   CO2 18 (L) 03/06/2022   Lab Results  Component Value Date   ALT 18 10/20/2018   AST 25 10/20/2018   ALKPHOS 151 (H) 10/20/2018   BILITOT 0.5 10/20/2018    Assessment & Plan    1.  Paroxysmal atrial fibrillation: Patient diagnosed with atrial fibrillation in December 2023.  Recent monitoring without any evidence of recurrent atrial fibrillation, and patient denies any recent history of palpitations.  Recent echo showed normal LV function with grade 1 diastolic dysfunction and mild MR.  She has been managed with oral beta-blocker therapy but is about to run out of it and as result, she is only taking once a day (has yet to take today).  CHA2DS2-VASc is 2 in the setting of history of hypertension and female gender.  She has not currently on oral anticoagulation given low risk.  ECG is normal today.  Refilling metoprolol.  Of note, she says that she underwent sleep study at [facility] last year was told that she does not require CPAP.  2.  Essential hypertension: Blood pressure elevated today.  She has yet to take her morning medications which include metoprolol 25 mg, amlodipine 10 mg, and chlorthalidone 25 mg.  In addition, she has only been taking metoprolol once a day instead of twice a day because she is about to run out.  Refilling metoprolol  today.  Encouraged her to take her medications as prescribed, watch salt intake and consider a salt substitute, avoid processed/fast foods, and to exercise regularly.  In addition, discussed the importance of smoking cessation.    3.  Tobacco abuse: Smoking half a pack a day and notes that she has been smoking at least 38 years.  She understands that she needs to quit but feels that she cannot do it alone.  She excepted a prescription for Chantix we discussed the importance of goalsetting and compliance.  4.  Dyspnea on exertion: Chronic.  Recent echo with normal LV function and diastolic dysfunction in the setting of hypertension.  She is euvolemic on examination.  As above, discussed the importance of smoking cessation, regular exercise, and weight loss.  5.  Disposition: Follow-up in 6 months or sooner if necessary.   Nicolasa Ducking, NP 08/07/2022, 11:59 AM

## 2022-09-11 ENCOUNTER — Ambulatory Visit
Admission: RE | Admit: 2022-09-11 | Discharge: 2022-09-11 | Disposition: A | Discharge status: Home / Self Care | Payer: Medicaid Other | Source: Ambulatory Visit | Attending: Urgent Care | Admitting: Urgent Care

## 2022-09-11 VITALS — BP 142/94 | HR 89 | Temp 98.6°F | Resp 16

## 2022-09-11 DIAGNOSIS — B009 Herpesviral infection, unspecified: Secondary | ICD-10-CM

## 2022-09-11 MED ORDER — VALACYCLOVIR HCL 1 G PO TABS
1000.00 mg | ORAL_TABLET | Freq: Two times a day (BID) | ORAL | 0 refills | Status: AC
Start: 2022-09-11 — End: 2022-09-21

## 2022-09-11 NOTE — Discharge Instructions (Addendum)
Speak to your PCP about testing for HSV1 and HSV2 to confirm, as well as for refills.

## 2022-09-11 NOTE — ED Provider Notes (Signed)
Carol Small    CSN: [id number] Arrival date & time: 09/11/22  1728      History   Chief Complaint Chief Complaint  Patient presents with   Blister    Cold Sore on lip(Pain) - Entered by patient    HPI Carol Small is a 37 y.o. female.   HPI  Presents to urgent care with complaint of a "cold sore" on her lower lip that is causing her pain.  Symptoms x 4 days.  Past Medical History:  Diagnosis Date   Anxiety    Diastolic dysfunction    a. 07/2022 Echo: EF 60-65%, no rwma, GrI DD, nl RV fxn, mild MR.   GERD (gastroesophageal reflux disease)    Hypertension    NO MEDS-NEVER FOLLOWED BACK UP WITH PCP   PAF (paroxysmal atrial fibrillation) (HCC)    a. Dx 02/2022; b. 06/2022 Zio: Predominantly sinus rhythm at 92 (58-179).  Rare PACs and PVCs.  Triggered events and equal sinus rhythm/PVCs.  No A-fib.   S/P LEEP of cervix     Patient Active Problem List   Diagnosis Date Noted   Paroxysmal atrial fibrillation (HCC) 04/23/2022   Alcohol use 08/06/2021   Current smoker 08/06/2021   GERD (gastroesophageal reflux disease) 08/06/2021   History of lumpectomy of left breast 08/06/2021   Other insomnia 05/06/2021   Routine health maintenance 06/05/2020   Hypokalemia 06/05/2020   Mild obstructive sleep apnea 06/05/2020   Asthma 07/25/2019   Benign paroxysmal positional vertigo 07/25/2019   Nonintractable headache 07/25/2019   Post concussion syndrome 07/25/2019   Uterine scar from previous cesarean delivery affecting pregnancy 10/20/2018   S/P cesarean section 10/20/2018   Indication for care in labor and delivery, antepartum 10/12/2018   Polyhydramnios affecting pregnancy in third trimester 10/04/2018   Abdominal pain during pregnancy in third trimester 09/25/2018   Polyhydramnios in third trimester 09/23/2018   History of cesarean section complicating pregnancy 09/23/2018   Right upper quadrant abdominal pain affecting pregnancy in third trimester 09/04/2018    Anxiety    Chronic hypertension affecting pregnancy 06/09/2018   Tobacco use affecting pregnancy, antepartum 05/10/2018   History of pre-eclampsia in prior pregnancy, currently pregnant 05/10/2018   Supervision of high risk pregnancy, antepartum 03/23/2018   Obesity affecting pregnancy, antepartum 03/23/2018   Essential hypertension 02/18/2018   Major depressive disorder 02/18/2018   Mood disorder (HCC) 02/18/2018    Past Surgical History:  Procedure Laterality Date   BREAST LUMPECTOMY Left 07/01/2015   Procedure: BREAST LUMPECTOMY;  Surgeon: Kieth Brightly, MD;  Location: [facility] ORS;  Service: General;  Laterality: Left;   CESAREAN SECTION  05/11/2011   fetal intolerance   CESAREAN SECTION N/A 10/20/2018   Procedure: CESAREAN SECTION;  Surgeon: Vena Austria, MD;  Location: [facility] ORS;  Service: Obstetrics;  Laterality: N/A;   FOOT SURGERY      OB History     Gravida  3   Para  2   Term  2   Preterm      AB  1   Living  2      SAB  1   IAB      Ectopic      Multiple  0   Live Births  2            Home Medications    Prior to Admission medications   Medication Sig Start Date End Date Taking? Authorizing Provider  albuterol (VENTOLIN HFA) 108 (90 Base) MCG/ACT inhaler Inhale  into the lungs. 08/06/21   [provider]  amLODipine (NORVASC) 10 MG tablet Take by mouth. 07/25/19 08/07/22  [provider]  chlorthalidone (HYGROTON) 25 MG tablet Take 25 mg by mouth daily. 06/02/19 08/07/22  [provider]  escitalopram (LEXAPRO) 10 MG tablet TAKE 1 TABLET(10 MG) BY MOUTH DAILY 04/09/20   Vena Austria, MD  famotidine (PEPCID) 20 MG tablet Take 1 tablet (20 mg total) by mouth 2 (two) times daily. 07/07/18   Oswaldo Conroy, CNM  ibuprofen (ADVIL) 600 MG tablet Take 1 tablet (600 mg total) by mouth every 8 (eight) hours as needed. 03/12/20   Joni Reining, PA-C  loperamide (IMODIUM) 2 MG capsule Take 1 capsule (2 mg total) by mouth  4 (four) times daily as needed for diarrhea or loose stools. 10/29/21   Valinda Hoar, NP  meclizine (ANTIVERT) 25 MG tablet Take 25 mg by mouth 3 (three) times daily as needed. 06/05/20   [provider]  metoprolol tartrate (LOPRESSOR) 25 MG tablet Take 1 tablet (25 mg total) by mouth 2 (two) times daily. 08/07/22 09/06/22  Creig Hines, NP  ondansetron (ZOFRAN-ODT) 4 MG disintegrating tablet Take 1 tablet (4 mg total) by mouth every 8 (eight) hours as needed for nausea or vomiting. 10/29/21   Valinda Hoar, NP  topiramate (TOPAMAX) 25 MG tablet 2 tablets in the morning for 2 weeks then 2 tablet twice daily Patient not taking: Reported on 08/07/2022 05/06/21   [provider]  traZODone (DESYREL) 50 MG tablet Take 1 tablet (50 mg total) by mouth at bedtime as needed for sleep. 09/04/18   Vena Austria, MD  varenicline (CHANTIX CONTINUING MONTH PAK) 1 MG tablet Take 1 tablet (1 mg total) by mouth 2 (two) times daily. 08/07/22   Creig Hines, NP  Varenicline Tartrate, Starter, (CHANTIX STARTING MONTH PAK) 0.5 MG X 11 & 1 MG X 42 TBPK Take one 0.5 mg tablet by mouth once daily for 3 days, Then increase to one 0.5 mg tablet twice daily for 4 days, Then increase to one 1 mg tablet twice daily.. 08/07/22   Creig Hines, NP    Family History Family History  Problem Relation Age of Onset   Hyperlipidemia Mother    Hypertension Mother    Heart murmur Mother    Hypertension Father    Arrhythmia Maternal Aunt    Heart failure Maternal Aunt    Heart attack Maternal Aunt    Heart disease Maternal Aunt    Hypertension Maternal Aunt    Stroke Maternal Aunt    Thyroid cancer Maternal Grandmother     Social History Social History   Tobacco Use   Smoking status: Every Day    Packs/day: 0.50    Years: 13.00    Additional pack years: 0.00    Total pack years: 6.50    Types: Cigarettes   Smokeless tobacco: Never  Vaping Use   Vaping Use:  Some days   Substances: Nicotine, Flavoring  Substance Use Topics   Alcohol use: Yes    Alcohol/week: 4.0 standard drinks of alcohol    Types: 4 Cans of beer per week   Drug use: No     Allergies   Erythromycin base, Adhesive [tape], Nickel, and Wound dressing adhesive   Review of Systems Review of Systems   Physical Exam Triage Vital Signs ED Triage Vitals  Enc Vitals Group     BP      Pulse  Resp      Temp      Temp src      SpO2      Weight      Height      Head Circumference      Peak Flow      Pain Score      Pain Loc      Pain Edu?      Excl. in GC?    No data found.  Updated Vital Signs There were no vitals taken for this visit.  Visual Acuity Right Eye Distance:   Left Eye Distance:   Bilateral Distance:    Right Eye Near:   Left Eye Near:    Bilateral Near:     Physical Exam Vitals reviewed.  Constitutional:      Appearance: Normal appearance.  HENT:     Mouth/Throat:     Lips: Lesions present.   Skin:    General: Skin is warm and dry.  Neurological:     General: No focal deficit present.     Mental Status: She is alert and oriented to person, place, and time.  Psychiatric:        Mood and Affect: Mood normal.        Behavior: Behavior normal.      UC Treatments / Results  Labs (all labs ordered are listed, but only abnormal results are displayed) Labs Reviewed - No data to display  EKG   Radiology No results found.  Procedures Procedures (including critical care time)  Medications Ordered in UC Medications - No data to display  Initial Impression / Assessment and Plan / UC Course  I have reviewed the triage vital signs and the nursing notes.  Pertinent labs & imaging results that were available during my care of the patient were reviewed by me and considered in my medical decision making (see chart for details).   Herpetic appearing lesion is present on her lower lip.  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  Recommended patient follow-up with her primary care for HSV testing and refills of antiviral treatment.  Counseled patient on potential for adverse effects with medications prescribed/recommended today, ER and return-to-clinic precautions discussed, patient verbalized understanding and agreement with care plan.  Final Clinical Impressions(s) / UC Diagnoses   Final diagnoses:  None   Discharge Instructions   None    ED Prescriptions   None    PDMP not reviewed this encounter.   Charma Igo, [place] 09/11/22 [phone]

## 2022-09-11 NOTE — ED Triage Notes (Signed)
Pimple on lip Monday, tried rupturing it. It got worse, believes it may be a cold sore. Bottom lip swollen. Has been using cold sore patches over the area without improvement.

## 2023-01-29 ENCOUNTER — Other Ambulatory Visit: Payer: Self-pay

## 2023-01-29 ENCOUNTER — Emergency Department
Admission: EM | Admit: 2023-01-29 | Discharge: 2023-01-29 | Disposition: A | Discharge status: Home / Self Care | Payer: Medicaid Other | Attending: Emergency Medicine | Admitting: Emergency Medicine

## 2023-01-29 DIAGNOSIS — R5381 Other malaise: Secondary | ICD-10-CM | POA: Diagnosis not present

## 2023-01-29 DIAGNOSIS — R002 Palpitations: Secondary | ICD-10-CM | POA: Diagnosis present

## 2023-01-29 DIAGNOSIS — R110 Nausea: Secondary | ICD-10-CM | POA: Diagnosis not present

## 2023-01-29 DIAGNOSIS — I4891 Unspecified atrial fibrillation: Secondary | ICD-10-CM | POA: Insufficient documentation

## 2023-01-29 DIAGNOSIS — I10 Essential (primary) hypertension: Secondary | ICD-10-CM | POA: Diagnosis not present

## 2023-01-29 LAB — CBC WITH DIFFERENTIAL/PLATELET
Abs Immature Granulocytes: 0.06 10*3/uL (ref 0.00–0.07)
Basophils Absolute: 0.1 10*3/uL (ref 0.0–0.1)
Basophils Relative: 1 %
Eosinophils Absolute: 0.1 10*3/uL (ref 0.0–0.5)
Eosinophils Relative: 1 %
HCT: 42.7 % (ref 36.0–46.0)
Hemoglobin: 14.3 g/dL (ref 12.0–15.0)
Immature Granulocytes: 1 %
Lymphocytes Relative: 24 %
Lymphs Abs: 3.1 10*3/uL (ref 0.7–4.0)
MCH: 30.3 pg (ref 26.0–34.0)
MCHC: 33.5 g/dL (ref 30.0–36.0)
MCV: 90.5 fL (ref 80.0–100.0)
Monocytes Absolute: 1 10*3/uL (ref 0.1–1.0)
Monocytes Relative: 7 %
Neutro Abs: 8.8 10*3/uL — ABNORMAL HIGH (ref 1.7–7.7)
Neutrophils Relative %: 66 %
Platelets: 393 10*3/uL (ref 150–400)
RBC: 4.72 MIL/uL (ref 3.87–5.11)
RDW: 13.2 % (ref 11.5–15.5)
WBC: 13.1 10*3/uL — ABNORMAL HIGH (ref 4.0–10.5)
nRBC: 0 % (ref 0.0–0.2)

## 2023-01-29 LAB — ETHANOL: Alcohol, Ethyl (B): 69 mg/dL — ABNORMAL HIGH (ref <10)

## 2023-01-29 LAB — COMPREHENSIVE METABOLIC PANEL
ALT: 16 U/L (ref 0–44)
AST: 18 U/L (ref 15–41)
Albumin: 3.7 g/dL (ref 3.5–5.0)
Alkaline Phosphatase: 68 U/L (ref 38–126)
Anion gap: 11 (ref 5–15)
BUN: 11 mg/dL (ref 6–20)
CO2: 22 mmol/L (ref 22–32)
Calcium: 8.1 mg/dL — ABNORMAL LOW (ref 8.9–10.3)
Chloride: 108 mmol/L (ref 98–111)
Creatinine, Ser: 0.71 mg/dL (ref 0.44–1.00)
GFR, Estimated: >60 mL/min (ref >60)
Glucose, Bld: 102 mg/dL — ABNORMAL HIGH (ref 70–99)
Potassium: 3.4 mmol/L — ABNORMAL LOW (ref 3.5–5.1)
Sodium: 141 mmol/L (ref 135–145)
Total Bilirubin: 0.2 mg/dL (ref <1.2)
Total Protein: 7.2 g/dL (ref 6.5–8.1)

## 2023-01-29 LAB — LIPASE, BLOOD: Lipase: 35 U/L (ref 11–51)

## 2023-01-29 LAB — MAGNESIUM: Magnesium: 2.3 mg/dL (ref 1.7–2.4)

## 2023-01-29 MED ORDER — SODIUM CHLORIDE 0.9 % IV BOLUS
1000.0000 mL | Freq: Once | INTRAVENOUS | Status: AC
  Administered 2023-01-29: 1000 mL via INTRAVENOUS

## 2023-01-29 MED ORDER — METOPROLOL TARTRATE 25 MG PO TABS
25.0000 mg | ORAL_TABLET | Freq: Once | ORAL | Status: AC
  Administered 2023-01-29: 25 mg via ORAL
  Filled 2023-01-29: qty 1

## 2023-01-29 MED ORDER — ONDANSETRON HCL 4 MG/2ML IJ SOLN
4.0000 mg | Freq: Once | INTRAMUSCULAR | Status: AC
  Administered 2023-01-29: 4 mg via INTRAVENOUS
  Filled 2023-01-29: qty 2

## 2023-01-29 MED ORDER — METOPROLOL TARTRATE 25 MG PO TABS: 25.0000 mg | ORAL_TABLET | Freq: Two times a day (BID) | ORAL | 1 refills | Status: AC

## 2023-01-29 MED ORDER — MAGNESIUM SULFATE 2 GM/50ML IV SOLN
2.0000 g | INTRAVENOUS | Status: AC
  Administered 2023-01-29: 2 g via INTRAVENOUS
  Filled 2023-01-29: qty 50

## 2023-01-29 NOTE — ED Provider Notes (Signed)
[facility] Provider Note    Event Date/Time   First MD Initiated Contact with Patient 01/29/23 [phone]     (approximate)   History   Chief Complaint: Atrial Fibrillation   HPI  Carol Small is a 37 y.o. female with a history of GERD, paroxysmal atrial fibrillation, hypertension who was in her usual state of health, yesterday evening had a heavy drinking episode, and this morning woke up with palpitations, malaise, nausea.  Tried taking metoprolol due to racing heart rate, but vomited immediately.  EMS noted atrial fibrillation with RVR, gave 500 mL of IV fluid and 20 mg of IV diltiazem without resolution.  Patient denies pain.  No fever.  Patient does not take anticoagulation.        Physical Exam   Triage Vital Signs: ED Triage Vitals  Encounter Vitals Group     BP      Systolic BP Percentile      Diastolic BP Percentile      Pulse      Resp      Temp      Temp src      SpO2      Weight      Height      Head Circumference      Peak Flow      Pain Score      Pain Loc      Pain Education      Exclude from Growth Chart     Most recent vital signs: Vitals:   01/29/23 0939 01/29/23 1130  BP: 92/60 (!) 126/98  Pulse: 66 (!) 103  Resp: 19 17  Temp:    SpO2: 100% 99%    General: Awake, no distress.  CV:  Good peripheral perfusion.  Irregular rhythm, heart rate 110, normal distal pulses Resp:  Normal effort.  Clear to auscultation bilaterally Abd:  No distention.  Soft nontender Other:  Dry oral mucosa   ED Results / Procedures / Treatments   Labs (all labs ordered are listed, but only abnormal results are displayed) Labs Reviewed  COMPREHENSIVE METABOLIC PANEL - Abnormal; Notable for the following components:      Result Value   Potassium 3.4 (*)    Glucose, Bld 102 (*)    Calcium 8.1 (*)    All other components within normal limits  CBC WITH DIFFERENTIAL/PLATELET - Abnormal; Notable for the following components:   WBC  13.1 (*)    Neutro Abs 8.8 (*)    All other components within normal limits  ETHANOL - Abnormal; Notable for the following components:   Alcohol, Ethyl (B) 69 (*)    All other components within normal limits  LIPASE, BLOOD  MAGNESIUM     EKG Interpreted by me Atrial fibrillation, rate of 110.  Normal axis, normal intervals.  Normal QRS ST segments and T waves  Repeat EKG shows atrial fibrillation, rate of 92.  No significant interval changes.  RADIOLOGY    PROCEDURES:  Procedures   MEDICATIONS ORDERED IN ED: Medications  metoprolol tartrate (LOPRESSOR) tablet 25 mg (has no administration in time range)  sodium chloride 0.9 % bolus 1,000 mL (0 mLs Intravenous Stopped 01/29/23 1131)  ondansetron (ZOFRAN) injection 4 mg (4 mg Intravenous Given 01/29/23 0936)  magnesium sulfate IVPB 2 g 50 mL (0 g Intravenous Stopped 01/29/23 1131)     IMPRESSION / MDM / ASSESSMENT AND PLAN / ED COURSE  I reviewed the triage vital signs and the  nursing notes.  DDx: Paroxysmal atrial fibrillation/holiday heart syndrome, dehydration, AKI, electrolyte abnormality, anemia  Patient's presentation is most consistent with acute presentation with potential threat to life or bodily function.  Patient presents with atrial fibrillation with tachycardia, most likely provoked by dehydration and heavy alcohol intake recently.  Will give IV fluids and IV magnesium, check labs.  ----------------------------------------- 12:10 PM on 01/29/2023 ----------------------------------------- Labs reassuring, consistent with recent alcohol intake.  Otherwise unremarkable.  Patient is feeling much better after IV fluids.  She had previously been prescribed twice daily metoprolol by cardiology.  Will request new cardiology referral, restart metoprolol.       FINAL CLINICAL IMPRESSION(S) / ED DIAGNOSES   Final diagnoses:  Atrial fibrillation, unspecified type (HCC)     Rx / DC Orders   ED Discharge  Orders          Ordered    Ambulatory referral to Cardiology       Comments: If you have not heard from the Cardiology office within the next 72 hours please call [phone].   01/29/23 1207    metoprolol tartrate (LOPRESSOR) 25 MG tablet  2 times daily        01/29/23 1209             Note:  This document was prepared using Dragon voice recognition software and may include unintentional dictation errors.   Sharman Cheek, MD 01/29/23 [phone]

## 2023-01-29 NOTE — ED Triage Notes (Signed)
Pt brought in by EMS from home for complaints of palpitations. Pt has known history of Afib, and per EMS pt presented with Afib RVR. Per pt, she took her metoprolol this morning, but then threw it it. EMS states HR was initially 90s-140s. 20mg  cardizem IV and 4mg  zofran IV administered by EMS; HR decreased to 85-130s. Upon arrival to ED, pt is a&o x 4, in NAD, HR 100 -115, no c/o N/V.

## 2023-01-30 NOTE — Progress Notes (Unsigned)
Cardiology Clinic Note   Date: 02/01/2023 ID: SUMAN, MCCOURT Jan 20, 1986, MRN [id number]  Primary Cardiologist:  Yvonne Kendall, MD  Patient Profile    Carol Small is a 37 y.o. female who presents to the clinic today for hospital follow up.     Past medical history significant for: PAF. Onset December 2023. 7-day ZIO 07/20/2022: Worn for 8 days and 21 hours.  Predominantly sinus rhythm.  HR 58 to 179 bpm, average 92 bpm.  Rare PACs/PVCs.  No sustained arrhythmia or prolonged pauses.  Patient triggered events corresponded to sinus rhythm and PVCs. Echo 07/28/2022: EF 60 to 65%.  No RWMA.  Grade I DD.  Normal RV size/function.  Mild MR. Hypertension. OSA. GERD. Tobacco abuse.  In summary, patient had sudden onset of palpitations, dyspnea, chest pain, dizziness and weakness in December 2023.  Upon EMS arrival she was found to be in rapid A-fib 170 bpm.  She was treated with IV diltiazem and converted to sinus rhythm while in the ED.  It was noted patient had consumed quite a bit of alcohol in the days leading up to her ED visit.  Patient reported drinking red bull the day before.  She was placed on a beta-blocker.  Lab work was unremarkable.   Patient was first evaluated by Dr. Okey Dupre on 04/23/2022 following her ED visit.  She wore an outpatient heart monitor which showed sinus rhythm with rare PACs/PVCs (further details above).     History of Present Illness    Carol Small is followed by Dr. Okey Dupre for the above outlined history.  Patient was last seen in the office by Ward Givens, NP on 08/07/2022 for follow-up after testing.  She denied further palpitations.  She reported ongoing DOE particularly with inclines and steps.  She expressed an interest in quitting smoking and inquired about assistance with Chantix which was prescribed.  No other medication changes were made.  Patient was evaluated in the ED on 01/29/2023 for palpitations. She reports she took her metoprolol  this morning but unfortunately vomited it back up. She was found to be in afib with RVR by EMS. ED note states that patient was drinking heavily the night prior. Patient received IV fluids and IV diltiazem without resolution. EKG showed Afib, rate 110 bpm. Patient received IV magnesium and po metoprolol while in the ED. Labs were unremarkable. She was discharged to follow up with cardiology as an outpatient.   Discussed the use of AI scribe software for clinical note transcription with the patient, who gave verbal consent to proceed.  The patient presents for hospital follow-up. She was seen in the ED for a-fib as above. She has not had any recurrent palpitations since discharge from ED. She denies consuming alcohol the night prior to ED visit. She states she drank earlier in the week. She reports rare, brief palpitations since her last visit in May. This can sometimes be brought on by physical activity such as walking up a hill. Patient denies any triggers prior to episode. She reports drinking alcohol earlier in the week but none the night before presenting to the ED. She typically drinks 1-2 caffeinated drinks per day. She states she is not an every day drinker but admits to binge drinking on the weekends when she does drink. She was diagnosed with sleep apnea about 2 years ago at [facility] but was never started on CPAP. We discussed afib triggers at length. She denies chest pain, pressure or tightness.  She works as a Water engineer.         ROS: All other systems reviewed and are otherwise negative except as noted in History of Present Illness.  Studies Reviewed    EKG Interpretation Date/Time:  Monday February 01 2023 16:03:50 EST Ventricular Rate:  77 PR Interval:  156 QRS Duration:  84 QT Interval:  370 QTC Calculation: 418 R Axis:   65  Text Interpretation: Normal sinus rhythm Possible Left atrial enlargement When compared with ECG of 29-Jan-2023 11:51, PREVIOUS ECG IS PRESENT Confirmed  by Carlos Levering [phone]) on 02/01/2023 4:08:34 PM    Risk Assessment/Calculations     CHA2DS2-VASc Score = 2   This indicates a 2.2% annual risk of stroke. The patient's score is based upon: CHF History: 0 HTN History: 1 Diabetes History: 0 Stroke History: 0 Vascular Disease History: 0 Age Score: 0 Gender Score: 1         STOP-Bang Score:  4       Physical Exam    VS:  BP 130/86 (BP Location: Left Arm, Patient Position: Sitting, Cuff Size: Normal)   Pulse 77   Ht 5\' 2"  (1.575 m)   Wt 178 lb 12.8 oz (81.1 kg)   SpO2 98%   BMI 32.70 kg/m  , BMI Body mass index is 32.7 kg/m.  GEN: Well nourished, well developed, in no acute distress. Neck: No JVD or carotid bruits. Cardiac:  RRR. No murmurs. No rubs or gallops.   Respiratory:  Respirations regular and unlabored. Clear to auscultation without rales, wheezing or rhonchi. GI: Soft, nontender, nondistended. Extremities: Radials/DP/PT 2+ and equal bilaterally. No clubbing or cyanosis. No edema.  Skin: Warm and dry, no rash. Neuro: Strength intact.  Assessment & Plan      PAF Onset December 2023.  7-day ZIO May 2024 showed sinus rhythm with rare PACs/PVCs and no sustained arrhythmia.  Patient with recent ED visit for afib with RVR on 11/22 treated with IV fluids, IV diltiazem and IV magnesium. She has cardiac awareness of afib. No further palpitations over the weekend. She is NSR today. She is frustrated that she keeps going into afib. Discussed triggers including alcohol consumption, untreated sleep apnea, caffeine.  -Refer to EP.  -Continue metoprolol.  OSA Patient reports being diagnosed with sleep apnea 2+ years ago at [facility]. She never started CPAP. Stop bang score 4.  -Refer to pulmonary for possible repeat sleep study.   Hypertension BP today 130/86.  -Continue amlodipine, chlorthalidone, metoprolol.  Tobacco abuse Patient continues to smoke a half a pack per day.  -She is interested in quitting but having a  hard time.  -Encouraged gradual reduction of cigarettes.          Disposition: Refer to EP. Refer to pulmonary. Cancel upcoming visit with Dr. Okey Dupre. Return in 3 months or sooner as needed.          Signed, Etta Grandchild. Lilliam Chamblee, DNP, NP-C

## 2023-02-01 ENCOUNTER — Encounter: Payer: Self-pay | Admitting: Student

## 2023-02-01 ENCOUNTER — Ambulatory Visit: Payer: Medicaid Other | Attending: Student | Admitting: Student

## 2023-02-01 VITALS — BP 130/86 | HR 77 | Ht 62.0 in | Wt 178.8 lb

## 2023-02-01 DIAGNOSIS — G4733 Obstructive sleep apnea (adult) (pediatric): Secondary | ICD-10-CM | POA: Diagnosis not present

## 2023-02-01 DIAGNOSIS — G4730 Sleep apnea, unspecified: Secondary | ICD-10-CM | POA: Diagnosis not present

## 2023-02-01 DIAGNOSIS — Z720 Tobacco use: Secondary | ICD-10-CM

## 2023-02-01 DIAGNOSIS — I480 Paroxysmal atrial fibrillation: Secondary | ICD-10-CM | POA: Diagnosis not present

## 2023-02-01 DIAGNOSIS — I10 Essential (primary) hypertension: Secondary | ICD-10-CM

## 2023-02-01 NOTE — Patient Instructions (Signed)
Medication Instructions:  No changes *If you need a refill on your cardiac medications before your next appointment, please call your pharmacy*   Lab Work: None ordered If you have labs (blood work) drawn today and your tests are completely normal, you will receive your results only by: MyChart Message (if you have MyChart) OR A paper copy in the mail If you have any lab test that is abnormal or we need to change your treatment, we will call you to review the results.   Testing/Procedures: None ordered   Follow-Up: At [facility], you and your health needs are our priority.  As part of our continuing mission to provide you with exceptional heart care, we have created designated Provider Care Teams.  These Care Teams include your primary Cardiologist (physician) and Advanced Practice Providers (APPs -  Physician Assistants and Nurse Practitioners) who all work together to provide you with the care you need, when you need it.  We recommend signing up for the patient portal called "MyChart".  Sign up information is provided on this After Visit Summary.  MyChart is used to connect with patients for Virtual Visits (Telemedicine).  Patients are able to view lab/test results, encounter notes, upcoming appointments, etc.  Non-urgent messages can be sent to your provider as well.   To learn more about what you can do with MyChart, go to [email or web address].    Your next appointment:   3 month(s)  Provider:   Yvonne Kendall, MD    Other Instructions A referral has been placed to Electrophysiology A referral has been placed to Pulmonology

## 2023-02-17 ENCOUNTER — Ambulatory Visit: Payer: Medicaid Other | Admitting: Internal Medicine

## 2023-03-23 ENCOUNTER — Ambulatory Visit: Payer: Medicaid Other | Admitting: Cardiology

## 2023-03-24 ENCOUNTER — Institutional Professional Consult (permissible substitution): Payer: Medicaid Other | Admitting: Internal Medicine

## 2023-03-24 NOTE — Progress Notes (Signed)
 This encounter was created in error - please disregard.

## 2023-03-25 ENCOUNTER — Encounter: Payer: Medicaid Other | Admitting: Internal Medicine

## 2023-03-25 DIAGNOSIS — G4733 Obstructive sleep apnea (adult) (pediatric): Secondary | ICD-10-CM

## 2023-04-06 ENCOUNTER — Ambulatory Visit: Payer: Medicaid Other | Admitting: Cardiology

## 2023-04-19 ENCOUNTER — Encounter: Payer: Self-pay | Admitting: Internal Medicine

## 2023-04-19 ENCOUNTER — Ambulatory Visit: Payer: Medicaid Other | Admitting: Internal Medicine

## 2023-04-19 VITALS — BP 158/110 | HR 72 | Temp 97.6°F | Ht 62.0 in | Wt 171.8 lb

## 2023-04-19 DIAGNOSIS — G4733 Obstructive sleep apnea (adult) (pediatric): Secondary | ICD-10-CM | POA: Diagnosis not present

## 2023-04-19 DIAGNOSIS — Z720 Tobacco use: Secondary | ICD-10-CM

## 2023-04-19 DIAGNOSIS — F17210 Nicotine dependence, cigarettes, uncomplicated: Secondary | ICD-10-CM

## 2023-04-19 MED ORDER — NICOTINE 21 MG/24HR TD PT24: 21.0000 mg | MEDICATED_PATCH | TRANSDERMAL | 1 refills | Status: AC

## 2023-04-19 NOTE — Patient Instructions (Signed)
 Recommend smoking cessation Recommend Home sleep Study to assess for sleep apnea  Avoid Allergens and Irritants Avoid secondhand smoke Avoid SICK contacts Recommend  Masking  when appropriate Recommend Keep up-to-date with vaccinations   Be aware of reduced alertness and do not drive or operate heavy machinery if experiencing this or drowsiness.  Exercise encouraged, as tolerated. Encouraged proper weight management.  Important to get eight or more hours of sleep  Limiting the use of the computer and television before bedtime.  Decrease naps during the day, so night time sleep will become enhanced.  Limit caffeine, and sleep deprivation.

## 2023-04-19 NOTE — Progress Notes (Signed)
 Name: Carol Small MRN: [id number] DOB: January 28, 1986    CHIEF COMPLAINT:  EXCESSIVE DAYTIME SLEEPINESS Assess for sleep apnea Smoking cessation counseling   HISTORY OF PRESENT ILLNESS: Patient is seen today for problems and issues with sleep related to excessive daytime sleepiness Patient  has been having sleep problems for many years Patient has been having excessive daytime sleepiness for a long time Patient has been having extreme fatigue and tiredness, lack of energy +  very Loud snoring every night + struggling breathe at night and gasps for air + morning headaches + Nonrefreshing sleep  Patient had a sleep study at [facility] 3 years ago however no report is found Patient with progressive fatigue Patient is a personal health aide Patient also has a 67-year-old Patient sleeps around 12-3 a.m. and gets up around 6:15 AM Patient with poor sleep hygiene   Discussed sleep data and reviewed with patient.  Encouraged proper weight management.  Discussed driving precautions and its relationship with hypersomnolence.  Discussed operating dangerous equipment and its relationship with hypersomnolence.  Discussed sleep hygiene, and benefits of a fixed sleep waked time.  The importance of getting eight or more hours of sleep discussed with patient.  Discussed limiting the use of the computer and television before bedtime.  Decrease naps during the day, so night time sleep will become enhanced.  Limit caffeine, and sleep deprivation.  HTN, stroke, and heart failure are potential risk factors.    EPWORTH SLEEP SCORE10   No evidence of heart failure at this time No evidence or signs of infection at this time No respiratory distress No fevers, chills, nausea, vomiting, diarrhea No evidence of lower extremity edema No evidence hemoptysis    PAST MEDICAL HISTORY :   has a past medical history of Anxiety, Diastolic dysfunction, GERD (gastroesophageal reflux disease),  Hypertension, PAF (paroxysmal atrial fibrillation) (HCC), and S/P LEEP of cervix.  has a past surgical history that includes Foot surgery; Breast lumpectomy (Left, 07/01/2015); Cesarean section (05/11/2011); and Cesarean section (N/A, 10/20/2018). Prior to Admission medications   Medication Sig Start Date End Date Taking? Authorizing Provider  albuterol  (VENTOLIN  HFA) 108 (90 Base) MCG/ACT inhaler Inhale into the lungs. 08/06/21   [provider]  amLODipine (NORVASC) 10 MG tablet Take by mouth. 07/25/19 08/07/22  [provider]  chlorthalidone (HYGROTON) 25 MG tablet Take 25 mg by mouth daily. 06/02/19 08/07/22  [provider]  escitalopram  (LEXAPRO ) 10 MG tablet TAKE 1 TABLET(10 MG) BY MOUTH DAILY 04/09/20   Darl Edu, MD  famotidine  (PEPCID ) 20 MG tablet Take 1 tablet (20 mg total) by mouth 2 (two) times daily. 07/07/18   Schmid, Jacelyn Y, CNM  ibuprofen  (ADVIL ) 600 MG tablet Take 1 tablet (600 mg total) by mouth every 8 (eight) hours as needed. 03/12/20   Marcina Severe, PA-C  loperamide  (IMODIUM ) 2 MG capsule Take 1 capsule (2 mg total) by mouth 4 (four) times daily as needed for diarrhea or loose stools. 10/29/21   Reena Canning, NP  meclizine (ANTIVERT) 25 MG tablet Take 25 mg by mouth 3 (three) times daily as needed. 06/05/20   [provider]  metoprolol  tartrate (LOPRESSOR ) 25 MG tablet Take 1 tablet (25 mg total) by mouth 2 (two) times daily. 01/29/23 02/28/23  Jacquie Maudlin, MD  ondansetron  (ZOFRAN -ODT) 4 MG disintegrating tablet Take 1 tablet (4 mg total) by mouth every 8 (eight) hours as needed for nausea or vomiting. 10/29/21   White, Adrienne R, NP  topiramate (TOPAMAX)  25 MG tablet  05/06/21   [provider]  traZODone  (DESYREL ) 50 MG tablet Take 1 tablet (50 mg total) by mouth at bedtime as needed for sleep. 09/04/18   Darl Edu, MD  varenicline  (CHANTIX  CONTINUING MONTH PAK) 1 MG tablet Take 1 tablet (1 mg total) by mouth 2  (two) times daily. 08/07/22   Florette Hurry, NP  Varenicline  Tartrate, Starter, (CHANTIX  STARTING MONTH PAK) 0.5 MG X 11 & 1 MG X 42 TBPK Take one 0.5 mg tablet by mouth once daily for 3 days, Then increase to one 0.5 mg tablet twice daily for 4 days, Then increase to one 1 mg tablet twice daily.. 08/07/22   Florette Hurry, NP   Allergies  Allergen Reactions   Erythromycin Base Other (See Comments)    Stomach cramps   Adhesive [Tape] Other (See Comments)    Skin becomes raw   Nickel Rash   Wound Dressing Adhesive Other (See Comments)    Skin becomes raw    FAMILY HISTORY:  family history includes Arrhythmia in her maternal aunt; Heart attack in her maternal aunt; Heart disease in her maternal aunt; Heart failure in her maternal aunt; Heart murmur in her mother; Hyperlipidemia in her mother; Hypertension in her father, maternal aunt, and mother; Stroke in her maternal aunt; Thyroid  cancer in her maternal grandmother. SOCIAL HISTORY:  reports that she has been smoking cigarettes. She has a 6.5 pack-year smoking history. She has never used smokeless tobacco. She reports current alcohol use of about 4.0 standard drinks of alcohol per week. She reports that she does not use drugs.   Review of Systems:  Gen:  Denies  fever, sweats, chills weight loss  HEENT: Denies blurred vision, double vision, ear pain, eye pain, hearing loss, nose bleeds, sore throat Cardiac:  No dizziness, chest pain or heaviness, chest tightness,edema, No JVD Resp:   No cough, -sputum production, -shortness of breath,-wheezing, -hemoptysis,  Gi: Denies swallowing difficulty, stomach pain, nausea or vomiting, diarrhea, constipation, bowel incontinence Gu:  Denies bladder incontinence, burning urine Ext:   Denies Joint pain, stiffness or swelling Skin: Denies  skin rash, easy bruising or bleeding or hives Endoc:  Denies polyuria, polydipsia , polyphagia or weight change Psych:   Denies depression,  insomnia or hallucinations  Other:  All other systems negative   ALL OTHER ROS ARE NEGATIVE  BP (!) 158/110 (BP Location: Left Arm, Patient Position: Sitting, Cuff Size: Normal)   Pulse 72   Temp 97.6 F (36.4 C) (Temporal)   Ht 5\' 2"  (1.575 m)   Wt 171 lb 12.8 oz (77.9 kg)   SpO2 99%   BMI 31.42 kg/m     Physical Examination:   General Appearance: No distress  EYES PERRLA, EOM intact.   NECK Supple, No JVD ORAL CAVITY MALLAMPATI 4 Pulmonary: normal breath sounds, No wheezing.  CardiovascularNormal S1,S2.  No m/r/g.   Abdomen: Benign, Soft, non-tender. Skin:   warm, no rashes, no ecchymosis  Extremities: normal, no cyanosis, clubbing. Neuro:without focal findings,  speech normal  PSYCHIATRIC: Mood, affect within normal limits.   ALL OTHER ROS ARE NEGATIVE    ASSESSMENT AND PLAN SYNOPSIS  38 yo Patient with signs and symptoms of excessive daytime sleepiness with probable underlying diagnosis of obstructive sleep apnea in the setting of obesity and deconditioned state   Recommend Sleep Study for definitve diagnosis Recommend home sleep study to assess for sleep apnea Be aware of reduced alertness and do not drive or operate heavy  machinery if experiencing this or drowsiness.  Exercise encouraged, as tolerated. Encouraged proper weight management.  Important to get eight or more hours of sleep  Limiting the use of the computer and television before bedtime.  Decrease naps during the day, so night time sleep will become enhanced.  Limit caffeine, and sleep deprivation.  HTN, stroke, uncontrolled diabetes and heart failure are potential risk factors.  Risk of untreated sleep apnea including cardiac arrhthymias, stroke, DM, pulm HTN.     Obesity -recommend significant weight loss -recommend changing diet  Deconditioned state -Recommend increased daily activity and exercise    Smoking Assessment and Cessation Counseling Upon further questioning, Patient  smokes 1/2 ppd I have advised patient to quit/stop smoking as soon as possible due to high risk for multiple medical problems  Patient is NOT willing to quit smoking  I have advised patient that we can assist and have options of Nicotine  replacement therapy. I also advised patient on behavioral therapy and can provide oral medication therapy in conjunction with the other therapies Follow up next Office visit  for assessment of smoking cessation Smoking cessation counseling advised for >10  minutes Nicotine  patches 21 mg prescribed   MEDICATION ADJUSTMENTS/LABS AND TESTS ORDERED: Recommend Sleep Study Please stop smoking Nicotine  patches 21 mg ordered Recommend weight loss Avoid Allergens and Irritants Avoid secondhand smoke Avoid SICK contacts Recommend  Masking  when appropriate Recommend Keep up-to-date with vaccinations Sleep hygiene recommended   CURRENT MEDICATIONS REVIEWED AT LENGTH WITH PATIENT TODAY   Patient  satisfied with Plan of action and management. All questions answered  Follow up  3 months   I spent a total of  64  minutes reviewing chart data, face-to-face evaluation with the patient, counseling and coordination of care as detailed above.    Lady Pier, M.D.  [facility]  Medical Director [facility] Medical Director [facility] Cardio-Pulmonary Department

## 2023-05-04 ENCOUNTER — Ambulatory Visit: Payer: Medicaid Other | Attending: Cardiology | Admitting: Cardiology

## 2023-05-04 ENCOUNTER — Encounter: Payer: Self-pay | Admitting: Cardiology

## 2023-05-04 VITALS — BP 160/111 | HR 99 | Ht 62.0 in | Wt 174.0 lb

## 2023-05-04 DIAGNOSIS — I10 Essential (primary) hypertension: Secondary | ICD-10-CM | POA: Diagnosis not present

## 2023-05-04 DIAGNOSIS — G4730 Sleep apnea, unspecified: Secondary | ICD-10-CM

## 2023-05-04 DIAGNOSIS — D6869 Other thrombophilia: Secondary | ICD-10-CM

## 2023-05-04 DIAGNOSIS — I480 Paroxysmal atrial fibrillation: Secondary | ICD-10-CM

## 2023-05-04 NOTE — Progress Notes (Unsigned)
 Electrophysiology Office Note:   Date:  05/05/2023  ID:  Carol Small, DOB 1985/11/28, MRN [id number]  Primary Cardiologist: Yvonne Kendall, MD Electrophysiologist: Nobie Putnam, MD      History of Present Illness:   Carol Small is a 38 y.o. female with h/o HTN, OSA, tobacco use, and paroxysmal atrial fibrillation who is being seen today for EP evaluation of her atrial fibrillation.  Discussed the use of AI scribe software for clinical note transcription with the patient, who gave verbal consent to proceed.  History of Present Illness   The patient, with a history of atrial fibrillation (AFib), estimates having had three episodes of AFib, the first of which occurred at the end of 2023 or 2024. The patient describes the episodes as causing her heart to pound and leading to shortness of breath. These symptoms are severe enough to cause fatigue and necessitate rest. The patient also experiences heart palpitations and fluttering when walking. The patient has been seen by a pulmonologist due to these symptoms, and a sleep study has been recommended, although this has not yet been scheduled. Otherwise doing well. No new or acute complaints.      Review of systems complete and found to be negative unless listed in HPI.   EP Information / Studies Reviewed:    EKG is not ordered today. EKG from 02/01/24  reviewed which showed sinus rhythm.     EKG 01/29/23: AF    Echo 07/28/22:  1. Left ventricular ejection fraction, by estimation, is 60 to 65%. The  left ventricle has normal function. The left ventricle has no regional  wall motion abnormalities. Left ventricular diastolic parameters are  consistent with Grade I diastolic  dysfunction (impaired relaxation).   2. Right ventricular systolic function is normal. The right ventricular  size is normal. Tricuspid regurgitation signal is inadequate for assessing  PA pressure.   3. The mitral valve is normal in structure. Mild mitral  valve  regurgitation. No evidence of mitral stenosis.   4. The aortic valve is tricuspid. Aortic valve regurgitation is not  visualized. No aortic stenosis is present.   5. The inferior vena cava is normal in size with greater than 50%  respiratory variability, suggesting right atrial pressure of 3 mmHg.   Zio 07/2022:   The patient was monitored for 8 days, 21 hours.   The predominant rhythm was sinus with an average rate of 92 bpm (range 58-179 bpm).   There were rare PAC's and PVC's.   No sustained arrhythmia or prolonged pause was observed.   Patient triggered events correspond to sinus rhythm and PVC's.   Predominantly sinus rhythm with rare PAC's and PVC's.  No significant arrhythmia identified.  Risk Assessment/Calculations:    CHA2DS2-VASc Score = 2   This indicates a 2.2% annual risk of stroke. The patient's score is based upon: CHF History: 0 HTN History: 1 Diabetes History: 0 Stroke History: 0 Vascular Disease History: 0 Age Score: 0 Gender Score: 1     Physical Exam:   VS:  BP (!) 160/111 (BP Location: Left Arm, Patient Position: Sitting, Cuff Size: Normal)   Pulse 99   Ht 5\' 2"  (1.575 m)   Wt 174 lb (78.9 kg)   SpO2 97%   BMI 31.83 kg/m    Wt Readings from Last 3 Encounters:  05/04/23 174 lb (78.9 kg)  04/19/23 171 lb 12.8 oz (77.9 kg)  02/01/23 178 lb 12.8 oz (81.1 kg)     GEN: Well  nourished, well developed in no acute distress NECK: No JVD CARDIAC: Normal rate, regular rhythm. RESPIRATORY:  Clear to auscultation without rales, wheezing or rhonchi  ABDOMEN: Soft, non-distended EXTREMITIES:  No edema; No deformity   ASSESSMENT AND PLAN:       #. Paroxysmal atrial fibrillation, symptomatic:  #. Secondary hypercoagulable state due to atrial fibrillation: CHADSVASC score of 2.  - Patient prefers rhythm control strategy. We discussed anti-arrhythmic drug therapy vs catheter ablation. Discussed risks and benefits of both options. Patient prefers  anti-arrhythmic drug therapy first.  - Order coronary CTA to rule out coronary artery disease. If coronary CTA is normal, initiate flecainide. - Schedule follow-up EKG one week after starting flecainide. - Continue metoprolol, consider switching to long-acting formulation after starting flecainide if patient prefers. - CHADSVASC score of 2, with 1 point for gender. No anti-coagulation currently. Would need AC if plans for ablation.  #. Sleep disordered breathing:  -Pulmonologist has ordered sleep study. Pending approval by insurance.    #. Hypertension -Above goal today.  Recommend checking blood pressures 1-2 times per week at home and recording the values.  Recommend bringing these recordings to the primary care physician.   Signed, Nobie Putnam, MD

## 2023-05-04 NOTE — Patient Instructions (Addendum)
 Medication Instructions:  Your physician recommends that you continue on your current medications as directed. Please refer to the Current Medication list given to you today.  *If you need a refill on your cardiac medications before your next appointment, please call your pharmacy*  Testing/Procedures: Coronary CTA Your physician has requested that you have cardiac CT. Cardiac computed tomography (CT) is a painless test that uses an x-ray machine to take clear, detailed pictures of your heart. For further information please visit [email or web address] Please follow instruction sheet as given.  Follow-Up: At [facility], you and your health needs are our priority.  As part of our continuing mission to provide you with exceptional heart care, we have created designated Provider Care Teams.  These Care Teams include your primary Cardiologist (physician) and Advanced Practice Providers (APPs -  Physician Assistants and Nurse Practitioners) who all work together to provide you with the care you need, when you need it.  Your next appointment:   3-4 months  Provider:   Sherie Don, NP

## 2023-05-05 ENCOUNTER — Ambulatory Visit: Payer: Medicaid Other | Admitting: Internal Medicine

## 2023-05-12 ENCOUNTER — Ambulatory Visit

## 2023-05-12 DIAGNOSIS — G4733 Obstructive sleep apnea (adult) (pediatric): Secondary | ICD-10-CM

## 2023-05-18 ENCOUNTER — Telehealth (HOSPITAL_COMMUNITY): Payer: Self-pay | Admitting: *Deleted

## 2023-05-18 NOTE — Telephone Encounter (Signed)

## 2023-05-20 ENCOUNTER — Ambulatory Visit
Admission: RE | Admit: 2023-05-20 | Discharge: 2023-05-20 | Disposition: A | Discharge status: Home / Self Care | Payer: Medicaid Other | Source: Ambulatory Visit | Attending: Cardiology | Admitting: Cardiology

## 2023-05-20 DIAGNOSIS — I480 Paroxysmal atrial fibrillation: Secondary | ICD-10-CM | POA: Insufficient documentation

## 2023-05-20 DIAGNOSIS — G4733 Obstructive sleep apnea (adult) (pediatric): Secondary | ICD-10-CM | POA: Diagnosis not present

## 2023-05-20 MED ORDER — ONDANSETRON HCL 4 MG/2ML IJ SOLN
4.0000 mg | Freq: Once | INTRAMUSCULAR | Status: AC
  Administered 2023-05-20: 4 mg via INTRAVENOUS

## 2023-05-20 MED ORDER — IOHEXOL 350 MG/ML SOLN
100.0000 mL | Freq: Once | INTRAVENOUS | Status: AC | PRN
  Administered 2023-05-20: 100 mL via INTRAVENOUS

## 2023-05-20 MED ORDER — SODIUM CHLORIDE 0.9 % IV BOLUS
250.0000 mL | Freq: Once | INTRAVENOUS | Status: AC
  Administered 2023-05-20: 250 mL via INTRAVENOUS

## 2023-05-20 MED ORDER — METOPROLOL TARTRATE 5 MG/5ML IV SOLN
10.0000 mg | INTRAVENOUS | Status: DC | PRN
Start: 2023-05-20 — End: 2023-05-21

## 2023-05-20 MED ORDER — NITROGLYCERIN 0.4 MG SL SUBL
0.8000 mg | SUBLINGUAL_TABLET | Freq: Once | SUBLINGUAL | Status: AC
  Administered 2023-05-20: 0.8 mg via SUBLINGUAL

## 2023-05-20 MED ORDER — DILTIAZEM HCL 25 MG/5ML IV SOLN: 10.0000 mg | INTRAVENOUS | Status: DC | PRN

## 2023-05-20 MED ORDER — SODIUM CHLORIDE 0.9 % IV BOLUS
150.0000 mL | Freq: Once | INTRAVENOUS | Status: AC
  Administered 2023-05-20: 150 mL via INTRAVENOUS

## 2023-05-20 NOTE — Progress Notes (Signed)
 Patient took premedication 1 hr prior. Instructions for 2 hrs prior. Placed in recliner to wait 1 hr for medication to work fully and hopefully HR decrease to a limit that can be scanned.

## 2023-05-20 NOTE — Progress Notes (Signed)
 Arrived for Cardiac CT. Took premedication Metoprolol 100 mg. Patient administered nitroglycerin 0.8 mg for Cardiac CT. Patient became hot, nausea, and blood pressure decreased. Patient alert answering and following commands appropriate. Administered Normal Saline bous, Zofran 4 mg IVP. Patient used the bathroom with nurse at side states feel better and blood pressure increased. Notified Dr Azucena Cecil reschedule Cardiac CT at hospital with Metoprolol 100 mg and Ivabradine 10mg  pre medication. Patient verbalized understanding of plan of care.

## 2023-05-21 NOTE — Addendum Note (Signed)
 Addended by: Jama Flavors on: 05/21/2023 11:34 AM   Modules accepted: Orders

## 2023-06-03 ENCOUNTER — Other Ambulatory Visit (HOSPITAL_COMMUNITY): Payer: Self-pay | Admitting: *Deleted

## 2023-06-03 MED ORDER — IVABRADINE HCL 5 MG PO TABS: ORAL_TABLET | ORAL | 0 refills | Status: AC

## 2023-06-04 ENCOUNTER — Telehealth (HOSPITAL_COMMUNITY): Payer: Self-pay | Admitting: *Deleted

## 2023-06-04 DIAGNOSIS — I480 Paroxysmal atrial fibrillation: Secondary | ICD-10-CM

## 2023-06-04 NOTE — Addendum Note (Signed)
 Addended by: Frutoso Schatz on: 06/04/2023 01:10 PM   Modules accepted: Orders

## 2023-06-04 NOTE — Telephone Encounter (Signed)
 Reaching out to patient to offer assistance regarding upcoming cardiac imaging study; pt verbalizes understanding of appt date/time, but states that she felt very bad after the nitroglycerin was given. She stated she got hot, nauseated, had to the use the bathroom, and she felt like she was "going to die." She does not wish to receive nitroglycerin again and understand that we cannot do the cardiac CT scan without nitroglycerin. I told her I would reach out to Dr. Jimmey Ralph about this.  Larey Brick RN Navigator Cardiac Imaging [facility] [phone] office [phone] cell

## 2023-06-07 ENCOUNTER — Ambulatory Visit: Admission: RE | Admit: 2023-06-07 | Source: Ambulatory Visit

## 2023-07-20 ENCOUNTER — Encounter (HOSPITAL_COMMUNITY): Payer: Self-pay

## 2023-07-20 ENCOUNTER — Other Ambulatory Visit (HOSPITAL_COMMUNITY): Payer: Self-pay | Admitting: *Deleted

## 2023-07-20 DIAGNOSIS — I480 Paroxysmal atrial fibrillation: Secondary | ICD-10-CM

## 2023-07-21 ENCOUNTER — Telehealth (HOSPITAL_COMMUNITY): Payer: Self-pay | Admitting: *Deleted

## 2023-07-21 NOTE — Telephone Encounter (Signed)

## 2023-07-22 ENCOUNTER — Ambulatory Visit
Admission: RE | Admit: 2023-07-22 | Discharge: 2023-07-22 | Disposition: A | Discharge status: Home / Self Care | Source: Ambulatory Visit | Attending: Cardiology | Admitting: Cardiology

## 2023-07-22 DIAGNOSIS — I480 Paroxysmal atrial fibrillation: Secondary | ICD-10-CM | POA: Diagnosis present

## 2023-07-22 LAB — NM PET CT CARDIAC PERFUSION MULTI W/ABSOLUTE BLOODFLOW
LV dias vol: 69 mL (ref 46–106)
LV sys vol: 53 mL
MBFR: 4.16
Nuc Rest EF: 65 %
Nuc Stress EF: 23 %
Peak HR: 109 {beats}/min
Rest HR: 86 {beats}/min
Rest MBF: 0.96 ml/g/min
Rest Nuclear Isotope Dose: 20.8 mCi
SRS: 5
SSS: 1
ST Depression (mm): 0 mm
Stress MBF: 3.99 ml/g/min
Stress Nuclear Isotope Dose: 21 mCi
TID: 1.05

## 2023-07-22 MED ORDER — DEXTROSE 5 % IV SOLN
INTRAVENOUS | Status: AC
Start: 2023-07-22 — End: ?
  Filled 2023-07-22: qty 50

## 2023-07-22 MED ORDER — REGADENOSON 0.4 MG/5ML IV SOLN
INTRAVENOUS | Status: AC
  Filled 2023-07-22: qty 5

## 2023-07-22 MED ORDER — RUBIDIUM RB82 GENERATOR (RUBYFILL)
25.0000 | PACK | Freq: Once | INTRAVENOUS | Status: AC
  Administered 2023-07-22: 20.98 via INTRAVENOUS

## 2023-07-22 MED ORDER — REGADENOSON 0.4 MG/5ML IV SOLN
0.4000 mg | Freq: Once | INTRAVENOUS | Status: AC
  Administered 2023-07-22: 0.4 mg via INTRAVENOUS
  Filled 2023-07-22: qty 5

## 2023-07-22 MED ORDER — RUBIDIUM RB82 GENERATOR (RUBYFILL)
25.0000 | PACK | Freq: Once | INTRAVENOUS | Status: AC
  Administered 2023-07-22: 20.84 via INTRAVENOUS

## 2023-07-22 MED ORDER — CAFFEINE CITRATE BASE COMPONENT 10 MG/ML IV SOLN
INTRAVENOUS | Status: AC
Start: 2023-07-22 — End: ?
  Filled 2023-07-22: qty 3

## 2023-07-22 NOTE — Progress Notes (Signed)
 Patient presents for a cardiac PET stress test and tolerated procedure without incident. Patient maintained acceptable vital signs throughout the test and was offered caffeine after test.  Patient ambulated out of department with a steady gait.

## 2023-07-28 ENCOUNTER — Ambulatory Visit: Payer: Self-pay | Admitting: Cardiology

## 2023-07-28 DIAGNOSIS — I480 Paroxysmal atrial fibrillation: Secondary | ICD-10-CM

## 2023-07-29 MED ORDER — FLECAINIDE ACETATE 50 MG PO TABS: 50.0000 mg | ORAL_TABLET | Freq: Two times a day (BID) | ORAL | 3 refills | Status: AC

## 2023-08-02 NOTE — Progress Notes (Deleted)
     Electrophysiology Clinic Note    Date:  08/02/2023  Patient ID:  CARNELLA, FRYMAN Dec 06, 1985, MRN [id number] PCP:  Aisha Hove, MD  Cardiologist:  Sammy Crisp, MD Electrophysiologist: Ardeen Kohler, MD  ***refresh  Discussed the use of AI scribe software for clinical note transcription with the patient, who gave verbal consent to proceed.   Patient Profile    Chief Complaint: ***  History of Present Illness: Carol Small is a 38 y.o. female with PMH notable for parox AFib, HTN, tobacco use; seen today for Ardeen Kohler, MD for routine electrophysiology followup.  She last saw Dr. Daneil Dunker 04/2023 for initial EP consult of her A-fib.  Patient quite symptomatic during A-fib episodes, patient preferred antiarrhythmic medication.    Since last being seen in our clinic the patient reports doing ***.  she denies chest pain, palpitations, dyspnea, PND, orthopnea, nausea, vomiting, dizziness, syncope, edema, weight gain, or early satiety.     Arrhythmia/Device History No specialty comments available.    Device Information: ***  AAD History: ***    ROS:  Please see the history of present illness. All other systems are reviewed and otherwise negative.    Physical Exam    VS:  There were no vitals taken for this visit. BMI: There is no height or weight on file to calculate BMI.  Wt Readings from Last 3 Encounters:  05/04/23 174 lb (78.9 kg)  04/19/23 171 lb 12.8 oz (77.9 kg)  02/01/23 178 lb 12.8 oz (81.1 kg)     GEN- The patient is well appearing, alert and oriented x 3 today.   Lungs- Clear to ausculation bilaterally, normal work of breathing.  Heart- {Blank single:19197::"Regular","Irregularly irregular"} rate and rhythm, no murmurs, rubs or gallops Extremities- {EDEMA LEVEL:28147::"No"} peripheral edema, warm, dry Skin-  *** device pocket well-healed, no tethering   Device interrogation done today and reviewed by myself:  Battery *** Lead  thresholds, impedence, sensing stable *** *** episodes *** changes made today   Studies Reviewed   Previous EP, cardiology notes.    EKG {ACTION; IS/IS WUJ:81191478} ordered. Personal review of EKG from {Blank single:19197::"today","***"} shows:  ***             Assessment and Plan     #) ***   #) ***   {Are you ordering a CV Procedure (e.g. stress test, cath, DCCV, TEE, etc)?   Press F2        :[id number]}   Current medicines are reviewed at length with the patient today.   The patient {ACTIONS; HAS/DOES NOT HAVE:19233} concerns regarding her medicines.  The following changes were made today:  {NONE DEFAULTED:18576}  Labs/ tests ordered today include: *** No orders of the defined types were placed in this encounter.    Disposition: Follow up with {EPMDS:28135} or EP APP {EPFOLLOW UP:28173}   Signed, Adaline Holly, NP  08/02/23  2:33 PM  Electrophysiology [facility]

## 2023-08-03 ENCOUNTER — Ambulatory Visit: Payer: Medicaid Other | Admitting: Cardiology

## 2023-08-12 ENCOUNTER — Telehealth (HOSPITAL_COMMUNITY): Payer: Self-pay | Admitting: *Deleted

## 2023-08-12 NOTE — Telephone Encounter (Signed)
 Reminder call for upcoming ETT on 08/20/23 at 9:30

## 2023-08-16 ENCOUNTER — Ambulatory Visit: Admitting: Sleep Medicine

## 2023-08-20 ENCOUNTER — Ambulatory Visit (HOSPITAL_COMMUNITY)
Admission: RE | Admit: 2023-08-20 | Discharge: 2023-08-20 | Disposition: A | Discharge status: Home / Self Care | Source: Ambulatory Visit | Attending: Cardiology | Admitting: Cardiology

## 2023-08-20 DIAGNOSIS — I480 Paroxysmal atrial fibrillation: Secondary | ICD-10-CM | POA: Insufficient documentation

## 2023-08-20 LAB — EXERCISE TOLERANCE TEST
Angina Index: 0
Duke Treadmill Score: 1
Estimated workload: 7.1
Exercise duration (min): 6 min
Exercise duration (sec): 6 s
MPHR: 183 {beats}/min
Peak HR: 136 {beats}/min
Percent HR: 74 %
RPE: 18
Rest HR: 86 {beats}/min
ST Depression (mm): 1 mm

## 2023-08-22 ENCOUNTER — Ambulatory Visit: Payer: Self-pay | Admitting: Cardiology

## 2023-08-22 NOTE — Progress Notes (Deleted)
 Electrophysiology Clinic Note    Date:  08/22/2023  Patient ID:  MERARY, GARGUILO [birth year], MRN [id number] PCP:  Aisha Hove, MD  Cardiologist:  Sammy Crisp, MD Electrophysiologist: Ardeen Kohler, MD  ***refresh  Discussed the use of AI scribe software for clinical note transcription with the patient, who gave verbal consent to proceed.   Patient Profile    Chief Complaint: afib follow-up  History of Present Illness: HANADI STANLY II is a 38 y.o. female with PMH notable for parox AFib, HTN, tobacco use; seen today for Ardeen Kohler, MD for routine electrophysiology followup.  She last saw Dr. Daneil Dunker 04/2023 for initial EP consult of her A-fib.  Patient quite symptomatic during A-fib episodes, patient preferred antiarrhythmic medication. He recommended eval of coronaries to r/u CAD with plans to initiate flecainide  after the imaging. NM pet was w/o CAD, so flecainide  was started with ETT 1 week after.   On follow-up today, *** AF burden, symptoms *** palpitations  *** OSA eval  ** taking flec and lopressor   - labs?   Since last being seen in our clinic the patient reports doing ***.  she denies chest pain, palpitations, dyspnea, PND, orthopnea, nausea, vomiting, dizziness, syncope, edema, weight gain, or early satiety.     Arrhythmia/Device History Flecainide     ROS:  Please see the history of present illness. All other systems are reviewed and otherwise negative.    Physical Exam    VS:  There were no vitals taken for this visit. BMI: There is no height or weight on file to calculate BMI.  Wt Readings from Last 3 Encounters:  05/04/23 174 lb (78.9 kg)  04/19/23 171 lb 12.8 oz (77.9 kg)  02/01/23 178 lb 12.8 oz (81.1 kg)     GEN- The patient is well appearing, alert and oriented x 3 today.   Lungs- Clear to ausculation bilaterally, normal work of breathing.  Heart- {Blank single:19197::Regular,Irregularly irregular} rate and rhythm, no  murmurs, rubs or gallops Extremities- {EDEMA LEVEL:28147::No} peripheral edema, warm, dry    Studies Reviewed   Previous EP, cardiology notes.    EKG is ordered. Personal review of EKG from today shows:  ***        02/01/2023 EKG - SR at 77bpm PR - 156 QRS - 84 QT 370  ETT, 08/20/2023   Exercise capacity was mildly impaired. Patient exercised for 6 min and 6 sec. Maximum HR of 136 bpm. MPHR 74.0%. Peak METS 7.1. The patient reported extreme dyspnea during the stress test. Blunted heart rate response noted during stress.   1.0 mm of down sloping ST depression (III and V6) was noted. The ECG was not diagnostic due to failure to achieve 85% MAPHR.   Prior study not available for comparison.  Pet CT Cardiac, 07/22/2023    LV perfusion is normal.   Rest left ventricular function is normal. Rest EF: 65%. End diastolic cavity size is normal.  Stress left ventricular systolic function felt to be inaccurate due to gating difficulty.   Myocardial blood flow was computed to be 0.31ml/g/min at rest and 3.70ml/g/min at stress. Global myocardial blood flow reserve was 4.16 and was normal.   Coronary calcium was absent on the attenuation correction CT images.   The study is probably normal and low risk.  Significantly reduced LVEF during stress is likely due to abnormal gating, as remainder of the study appears normal.  If there is clinical concern for stress-induced drop in LVEF, stress  echocardiogram could be considered for further evaluation.  TTE, 07/28/2023  1. Left ventricular ejection fraction, by estimation, is 60 to 65%. The left ventricle has normal function. The left ventricle has no regional wall motion abnormalities. Left ventricular diastolic parameters are consistent with Grade I diastolic dysfunction (impaired relaxation).   2. Right ventricular systolic function is normal. The right ventricular size is normal. Tricuspid regurgitation signal is inadequate for assessing PA pressure.    3. The mitral valve is normal in structure. Mild mitral valve regurgitation. No evidence of mitral stenosis.   4. The aortic valve is tricuspid. Aortic valve regurgitation is not visualized. No aortic stenosis is present.   5. The inferior vena cava is normal in size with greater than 50% respiratory variability, suggesting right atrial pressure of 3 mmHg.   Long term monitor, 06/12/2022   The patient was monitored for 8 days, 21 hours.   The predominant rhythm was sinus with an average rate of 92 bpm (range 58-179 bpm).   There were rare PAC's and PVC's.   No sustained arrhythmia or prolonged pause was observed.   Patient triggered events correspond to sinus rhythm and PVC's.  Assessment and Plan     #) Parox AFib    #) Hypercoag d/t parox afib CHA2DS2-VASc Score = at least 2 [CHF History: 0, HTN History: 1, Diabetes History: 0, Stroke History: 0, Vascular Disease History: 0, Age Score: 0, Gender Score: 1].  Therefore, the patient's annual risk of stroke is 2.2 %.    Stroke ppx - No OAC given low chadsvasc , appropriately dosed No bleeding concerns    #) ***   {Are you ordering a CV Procedure (e.g. stress test, cath, DCCV, TEE, etc)?   Press F2        :[id number]}   Current medicines are reviewed at length with the patient today.   The patient {ACTIONS; HAS/DOES NOT HAVE:19233} concerns regarding her medicines.  The following changes were made today:  {NONE DEFAULTED:18576}  Labs/ tests ordered today include: *** No orders of the defined types were placed in this encounter.    Disposition: Follow up with {EPMDS:28135} or EP APP {EPFOLLOW UP:28173}   Signed, Adaline Holly, NP  08/22/23  2:05 PM  Electrophysiology [facility]

## 2023-08-23 ENCOUNTER — Ambulatory Visit: Admitting: Cardiology

## 2023-08-23 DIAGNOSIS — I480 Paroxysmal atrial fibrillation: Secondary | ICD-10-CM

## 2023-08-23 DIAGNOSIS — I10 Essential (primary) hypertension: Secondary | ICD-10-CM

## 2023-10-26 NOTE — Progress Notes (Deleted)
 Electrophysiology Clinic Note    Date:  10/26/2023  Patient ID:  AKEIA, PEROT 27-Oct-1985, MRN [id number] PCP:  Fernand Fredy RAMAN, MD  Cardiologist:  Lonni Hanson, MD   Electrophysiologist:  Fonda Kitty, MD    ***refresh  Discussed the use of AI scribe software for clinical note transcription with the patient, who gave verbal consent to proceed.   Patient Profile    Chief Complaint: ***  History of Present Illness: Carol Small is a 38 y.o. female with PMH notable for parox Afib, HTN, OSA, tobacco use; seen today for Fonda Kitty, MD for routine electrophysiology followup.   She last saw Dr. Kitty 04/2023 for initial EP evaluation. They discussed AAD vs ablation, patient preferred medication and coronary CTA was ordered to assess for CAD. During test, she had adverse effect to nitroglycerin  (hot, nauseated, felt like she was going to die) and refused to perform test. She instead had cardiac PET that was normal, and flecainide  was started with ETT 1 week after that was normal.   On follow-up today, *** AF burden, symptoms *** palpitations *** bleeding concerns  *** how taking flecainide      - needs BMP, mag, CBC     Since last being seen in our clinic the patient reports doing ***.  she denies chest pain, palpitations, dyspnea, PND, orthopnea, nausea, vomiting, dizziness, syncope, edema, weight gain, or early satiety.      Arrhythmia/Device History Flecainide      ROS:  Please see the history of present illness. All other systems are reviewed and otherwise negative.    Physical Exam    VS:  There were no vitals taken for this visit. BMI: There is no height or weight on file to calculate BMI.      Wt Readings from Last 3 Encounters:  05/04/23 174 lb (78.9 kg)  04/19/23 171 lb 12.8 oz (77.9 kg)  02/01/23 178 lb 12.8 oz (81.1 kg)     GEN- The patient is well appearing, alert and oriented x 3 today.   Lungs- Clear to ausculation  bilaterally, normal work of breathing.  Heart- {Blank single:19197::Regular,Irregularly irregular} rate and rhythm, no murmurs, rubs or gallops Extremities- {EDEMA LEVEL:28147::No} peripheral edema, warm, dry    Studies Reviewed   Previous EP, cardiology notes.    EKG is ordered. Personal review of EKG from today shows:  ***        ETT, 08/20/2023   Exercise capacity was mildly impaired. Patient exercised for 6 min and 6 sec. Maximum HR of 136 bpm. MPHR 74.0%. Peak METS 7.1. The patient reported extreme dyspnea during the stress test. Blunted heart rate response noted during stress.   1.0 mm of down sloping ST depression (III and V6) was noted. The ECG was not diagnostic due to failure to achieve 85% MAPHR.   Prior study not available for comparison.  Cardiac PET, 07/22/2023   LV perfusion is normal.   Rest left ventricular function is normal. Rest EF: 65%. End diastolic cavity size is normal.  Stress left ventricular systolic function felt to be inaccurate due to gating difficulty.   Myocardial blood flow was computed to be 0.70ml/g/min at rest and 3.37ml/g/min at stress. Global myocardial blood flow reserve was 4.16 and was normal.   Coronary calcium was absent on the attenuation correction CT images.   The study is probably normal and low risk.  Significantly reduced LVEF during stress is likely due to abnormal gating, as remainder of  the study appears normal.  If there is clinical concern for stress-induced drop in LVEF, stress echocardiogram could be considered for further evaluation.  TTE, 07/28/2022  1. Left ventricular ejection fraction, by estimation, is 60 to 65%. The left ventricle has normal function. The left ventricle has no regional wall motion abnormalities. Left ventricular diastolic parameters are consistent with Grade I diastolic dysfunction (impaired relaxation).   2. Right ventricular systolic function is normal. The right ventricular  size is normal. Tricuspid  regurgitation signal is inadequate for assessing PA pressure.   3. The mitral valve is normal in structure. Mild mitral valve  regurgitation. No evidence of mitral stenosis.   4. The aortic valve is tricuspid. Aortic valve regurgitation is not  visualized. No aortic stenosis is present.   5. The inferior vena cava is normal in size with greater than 50%  respiratory variability, suggesting right atrial pressure of 3 mmHg.    Assessment and Plan     #) Parox AFib #) flecainide  monitoring  EKG with stable intervals on 50mg  flecainide  BID + 25mg  lopressor  BID  #) Hypercoag d/t parox afib CHA2DS2-VASc Score = at least 2 (HTN, gender)  No OAC d/t low chadsvasc with 1 point being gender   [CHF History: 0, HTN History: 1, Diabetes History: 0, Stroke History: 0, Vascular Disease History: 0, Age Score: 0, Gender Score: 1].  Therefore, the patient's annual risk of stroke is 2.2 %.     {Confirm score is correct.  If not, click here to update score.  REFRESH note.  :1}   Stroke ppx - ***, appropriately dosed No bleeding concerns   #) ***   {Are you ordering a CV Procedure (e.g. stress test, cath, DCCV, TEE, etc)?   Press F2        :[id number]}   Current medicines are reviewed at length with the patient today.   The patient {ACTIONS; HAS/DOES NOT HAVE:19233} concerns regarding her medicines.  The following changes were made today:  {NONE DEFAULTED:18576}  Labs/ tests ordered today include: *** No orders of the defined types were placed in this encounter.    Disposition: Follow up with {EPMDS:28135::EP Team} or EP APP {EPFOLLOW UP:28173}   Signed, Chantal Needle, NP  10/26/23  7:04 PM  Electrophysiology [facility]

## 2023-10-27 ENCOUNTER — Ambulatory Visit: Attending: Cardiology | Admitting: Cardiology

## 2023-10-27 DIAGNOSIS — Z5181 Encounter for therapeutic drug level monitoring: Secondary | ICD-10-CM

## 2023-10-27 DIAGNOSIS — I480 Paroxysmal atrial fibrillation: Secondary | ICD-10-CM
